# Patient Record
Sex: Female | Born: 1945 | Race: White | Hispanic: No | Marital: Married | Smoking: Never smoker
Health system: Southern US, Community
[De-identification: ages and names within clinical notes are randomized; demographics above are authoritative.]

## PROBLEM LIST (undated history)

## (undated) DIAGNOSIS — R911 Solitary pulmonary nodule: Secondary | ICD-10-CM

## (undated) DIAGNOSIS — K219 Gastro-esophageal reflux disease without esophagitis: Secondary | ICD-10-CM

## (undated) DIAGNOSIS — E049 Nontoxic goiter, unspecified: Secondary | ICD-10-CM

## (undated) DIAGNOSIS — M797 Fibromyalgia: Secondary | ICD-10-CM

## (undated) DIAGNOSIS — Z8371 Family history of colonic polyps: Secondary | ICD-10-CM

## (undated) DIAGNOSIS — R634 Abnormal weight loss: Secondary | ICD-10-CM

## (undated) DIAGNOSIS — R319 Hematuria, unspecified: Secondary | ICD-10-CM

## (undated) DIAGNOSIS — Z9109 Other allergy status, other than to drugs and biological substances: Secondary | ICD-10-CM

## (undated) DIAGNOSIS — F419 Anxiety disorder, unspecified: Secondary | ICD-10-CM

## (undated) DIAGNOSIS — I10 Essential (primary) hypertension: Secondary | ICD-10-CM

## (undated) DIAGNOSIS — Z83719 Family history of colon polyps, unspecified: Secondary | ICD-10-CM

## (undated) DIAGNOSIS — M542 Cervicalgia: Secondary | ICD-10-CM

## (undated) DIAGNOSIS — E785 Hyperlipidemia, unspecified: Secondary | ICD-10-CM

## (undated) DIAGNOSIS — N183 Chronic kidney disease, stage 3 unspecified: Secondary | ICD-10-CM

## (undated) DIAGNOSIS — E119 Type 2 diabetes mellitus without complications: Secondary | ICD-10-CM

## (undated) DIAGNOSIS — I2510 Atherosclerotic heart disease of native coronary artery without angina pectoris: Secondary | ICD-10-CM

## (undated) DIAGNOSIS — M810 Age-related osteoporosis without current pathological fracture: Secondary | ICD-10-CM

## (undated) HISTORY — DX: Other allergy status, other than to drugs and biological substances: Z91.09

## (undated) HISTORY — DX: Solitary pulmonary nodule: R91.1

## (undated) HISTORY — DX: Atherosclerotic heart disease of native coronary artery without angina pectoris: I25.10

## (undated) HISTORY — DX: Gastro-esophageal reflux disease without esophagitis: K21.9

## (undated) HISTORY — DX: Type 2 diabetes mellitus without complications: E11.9

## (undated) HISTORY — PX: NASAL SINUS SURGERY: SHX719

## (undated) HISTORY — DX: Hyperlipidemia, unspecified: E78.5

## (undated) HISTORY — DX: Anxiety disorder, unspecified: F41.9

## (undated) HISTORY — DX: Abnormal weight loss: R63.4

## (undated) HISTORY — PX: BACK SURGERY: SHX140

## (undated) HISTORY — DX: Cervicalgia: M54.2

## (undated) HISTORY — DX: Fibromyalgia: M79.7

## (undated) HISTORY — DX: Family history of colonic polyps: Z83.71

## (undated) HISTORY — DX: Chronic kidney disease, stage 3 (moderate): N18.3

## (undated) HISTORY — DX: Chronic kidney disease, stage 3 unspecified: N18.30

## (undated) HISTORY — DX: Nontoxic goiter, unspecified: E04.9

## (undated) HISTORY — DX: Essential (primary) hypertension: I10

## (undated) HISTORY — DX: Age-related osteoporosis without current pathological fracture: M81.0

## (undated) HISTORY — DX: Hematuria, unspecified: R31.9

## (undated) HISTORY — DX: Family history of colon polyps, unspecified: Z83.719

## (undated) HISTORY — PX: NECK SURGERY: SHX720

---

## 1998-03-04 ENCOUNTER — Other Ambulatory Visit: Admission: RE | Admit: 1998-03-04 | Discharge: 1998-03-04 | Payer: Self-pay | Admitting: Podiatry

## 1999-03-24 ENCOUNTER — Encounter: Admission: RE | Admit: 1999-03-24 | Discharge: 1999-03-24 | Payer: Self-pay | Admitting: Family Medicine

## 1999-03-24 ENCOUNTER — Encounter: Payer: Self-pay | Admitting: Family Medicine

## 1999-11-12 ENCOUNTER — Encounter: Admission: RE | Admit: 1999-11-12 | Discharge: 1999-11-12 | Payer: Self-pay | Admitting: Family Medicine

## 1999-11-12 ENCOUNTER — Encounter: Payer: Self-pay | Admitting: Family Medicine

## 2000-05-17 ENCOUNTER — Encounter: Admission: RE | Admit: 2000-05-17 | Discharge: 2000-05-17 | Payer: Self-pay | Admitting: Family Medicine

## 2000-05-17 ENCOUNTER — Encounter: Payer: Self-pay | Admitting: Family Medicine

## 2000-11-14 ENCOUNTER — Encounter: Payer: Self-pay | Admitting: Family Medicine

## 2000-11-14 ENCOUNTER — Encounter: Admission: RE | Admit: 2000-11-14 | Discharge: 2000-11-14 | Payer: Self-pay | Admitting: Family Medicine

## 2001-04-18 ENCOUNTER — Emergency Department (HOSPITAL_COMMUNITY): Admission: EM | Admit: 2001-04-18 | Discharge: 2001-04-18 | Payer: Self-pay | Admitting: Emergency Medicine

## 2001-11-20 ENCOUNTER — Encounter: Admission: RE | Admit: 2001-11-20 | Discharge: 2001-11-20 | Payer: Self-pay | Admitting: Family Medicine

## 2001-11-20 ENCOUNTER — Encounter: Payer: Self-pay | Admitting: Family Medicine

## 2002-09-13 ENCOUNTER — Encounter: Payer: Self-pay | Admitting: Specialist

## 2002-09-18 ENCOUNTER — Inpatient Hospital Stay (HOSPITAL_COMMUNITY): Admission: RE | Admit: 2002-09-18 | Discharge: 2002-09-20 | Payer: Self-pay | Admitting: Specialist

## 2002-09-18 ENCOUNTER — Encounter: Payer: Self-pay | Admitting: Specialist

## 2002-09-19 ENCOUNTER — Encounter: Payer: Self-pay | Admitting: Specialist

## 2002-11-25 ENCOUNTER — Encounter: Payer: Self-pay | Admitting: Family Medicine

## 2002-11-25 ENCOUNTER — Encounter: Admission: RE | Admit: 2002-11-25 | Discharge: 2002-11-25 | Payer: Self-pay | Admitting: Family Medicine

## 2003-11-25 ENCOUNTER — Other Ambulatory Visit: Admission: RE | Admit: 2003-11-25 | Discharge: 2003-11-25 | Payer: Self-pay | Admitting: Family Medicine

## 2003-11-28 ENCOUNTER — Ambulatory Visit (HOSPITAL_COMMUNITY): Admission: RE | Admit: 2003-11-28 | Discharge: 2003-11-28 | Payer: Self-pay | Admitting: Family Medicine

## 2003-12-22 ENCOUNTER — Ambulatory Visit (HOSPITAL_COMMUNITY): Admission: RE | Admit: 2003-12-22 | Discharge: 2003-12-22 | Payer: Self-pay | Admitting: Gastroenterology

## 2004-08-11 ENCOUNTER — Encounter: Admission: RE | Admit: 2004-08-11 | Discharge: 2004-08-11 | Payer: Self-pay | Admitting: Specialist

## 2004-12-08 ENCOUNTER — Ambulatory Visit (HOSPITAL_COMMUNITY): Admission: RE | Admit: 2004-12-08 | Discharge: 2004-12-08 | Payer: Self-pay | Admitting: Family Medicine

## 2005-01-05 ENCOUNTER — Other Ambulatory Visit: Admission: RE | Admit: 2005-01-05 | Discharge: 2005-01-05 | Payer: Self-pay | Admitting: Family Medicine

## 2005-08-12 ENCOUNTER — Encounter: Admission: RE | Admit: 2005-08-12 | Discharge: 2005-08-12 | Payer: Self-pay | Admitting: Family Medicine

## 2005-08-26 ENCOUNTER — Encounter: Admission: RE | Admit: 2005-08-26 | Discharge: 2005-08-26 | Payer: Self-pay | Admitting: Family Medicine

## 2005-12-12 ENCOUNTER — Ambulatory Visit (HOSPITAL_COMMUNITY): Admission: RE | Admit: 2005-12-12 | Discharge: 2005-12-12 | Payer: Self-pay | Admitting: Family Medicine

## 2006-01-19 ENCOUNTER — Other Ambulatory Visit: Admission: RE | Admit: 2006-01-19 | Discharge: 2006-01-19 | Payer: Self-pay | Admitting: Family Medicine

## 2006-07-25 ENCOUNTER — Encounter: Admission: RE | Admit: 2006-07-25 | Discharge: 2006-07-25 | Payer: Self-pay | Admitting: Family Medicine

## 2006-08-07 ENCOUNTER — Encounter: Admission: RE | Admit: 2006-08-07 | Discharge: 2006-08-07 | Payer: Self-pay | Admitting: Family Medicine

## 2006-12-26 ENCOUNTER — Ambulatory Visit (HOSPITAL_COMMUNITY): Admission: RE | Admit: 2006-12-26 | Discharge: 2006-12-26 | Payer: Self-pay | Admitting: Family Medicine

## 2007-09-03 ENCOUNTER — Other Ambulatory Visit: Admission: RE | Admit: 2007-09-03 | Discharge: 2007-09-03 | Payer: Self-pay | Admitting: Family Medicine

## 2008-01-21 ENCOUNTER — Ambulatory Visit (HOSPITAL_COMMUNITY): Admission: RE | Admit: 2008-01-21 | Discharge: 2008-01-21 | Payer: Self-pay | Admitting: Family Medicine

## 2008-09-12 ENCOUNTER — Emergency Department (HOSPITAL_COMMUNITY): Admission: EM | Admit: 2008-09-12 | Discharge: 2008-09-12 | Payer: Self-pay | Admitting: Emergency Medicine

## 2008-12-09 ENCOUNTER — Encounter: Admission: RE | Admit: 2008-12-09 | Discharge: 2008-12-09 | Payer: Self-pay | Admitting: Family Medicine

## 2008-12-09 ENCOUNTER — Other Ambulatory Visit: Admission: RE | Admit: 2008-12-09 | Discharge: 2008-12-09 | Payer: Self-pay | Admitting: Family Medicine

## 2009-01-21 ENCOUNTER — Ambulatory Visit (HOSPITAL_COMMUNITY): Admission: RE | Admit: 2009-01-21 | Discharge: 2009-01-21 | Payer: Self-pay | Admitting: Family Medicine

## 2010-01-22 ENCOUNTER — Ambulatory Visit (HOSPITAL_COMMUNITY)
Admission: RE | Admit: 2010-01-22 | Discharge: 2010-01-22 | Payer: Self-pay | Source: Home / Self Care | Admitting: Family Medicine

## 2010-03-14 ENCOUNTER — Encounter: Payer: Self-pay | Admitting: Family Medicine

## 2010-07-09 NOTE — Consult Note (Signed)
NAME:  Stephanie Henson, Stephanie Henson                         ACCOUNT NO.:  [id number]   MEDICAL RECORD NO.:  [id number]                   PATIENT TYPE:  INP   LOCATION:  2550                                 FACILITY:  [facility]   PHYSICIAN:  Gustavus Messing. Orlin Hilding, M.D.          DATE OF BIRTH:  Apr 30, 1945   DATE OF CONSULTATION:  09/18/2002  DATE OF DISCHARGE:                                   CONSULTATION   REPORT TITLE:  NEUROLOGY CONSULTATION   REFERRING PHYSICIAN:  Desma Maxim, M.D.   CHIEF COMPLAINT:  Left arm is numb and weak.   HISTORY OF PRESENT ILLNESS:  Ms. Drummonds is a 65 year old right-handed white  woman admitted for C5-6, C6-7 anterior fusion and diskectomy and left arm  pain and weakness from noted disk herniation and degenerative disk disease.  The patient tells me that she has had some weakness from the shoulder down  preoperatively but fairly mild. The degree of objective preoperative  weakness is not documented. She also had pain and shoulder numbness.  Office  notes indicate that she had normal strength in her shoulders and that her  neurologic examination was grossly intact in the bilateral upper extremities  as of June 65, 2004.   Postoperatively, the patient complained of total left arm weakness and  numbness and had flaccid left upper extremity.  Over the next few hours, it  improved considerably. I do not know if it is now at baseline.   REVIEW OF SYSTEMS:  She hurts all over and it hurts to move her limbs.  She  complains of left hip pain particularly where they took a piece of bone for  a graft and bilateral upper extremity pain.  She also complained of  bilateral upper extremity weakness.  No incontinence.   PAST MEDICAL HISTORY:  Significant for hypertension, hyperlipidemia,  migraine.  Remote surgery for deviated septum, remote right foot surgery,  and lumbar spine surgery in 1980.   PREOPERATIVE MEDICATIONS:  Welchol,  Zetia, hydrochlorothiazide, Zyrtec,  Vicodin and Atarax.  Postoperatively, she had received Dilaudid, Zofran, and  morphine.   ALLERGIES:  ASPIRIN and CODEINE.   SOCIAL HISTORY:  She is married.  Denies cigarette, alcohol, or drug use.   FAMILY HISTORY:  Positive for hypertension.   PHYSICAL EXAMINATION:  VITAL SIGNS:  Blood pressure 150/45, respirations 18,  95% saturation, pulse 100.  HEENT:  Head is normocephalic, atraumatic.  NECK:  Her neck was in a cervical collar.  Cannot assess her carotids.  HEART:  Regular rate and rhythm.  NEUROLOGICAL:  On mental status, she is somewhat sedated for pain and has  limited cooperation.  Cranial nerves:  The pupils are equal and reactive.  Disk margins are sharp.  Visual fields are full.  Extraocular movements are  intact without nystagmus, ophthalmus, paresis or ptosis.  Facial sensation  is normal.  Motor is intact, hearing is intact.  Palate is symmetric and  tongue is midline.  On motor examination, she has decreased effort diffusely  which makes it hard to really assess.  Her grips are weak bilaterally with  weak effort. However, she has at least 4/5 upper extremity strength  bilaterally and 5-/5 lower extremities. There is no drift on the left.  She  has pretty good biceps/triceps.  She does not make much effort with her hand  and winces when I try deltoid because of shoulder pain.  Reflexes are trace  brachioradialis, 2+ biceps, 2+ triceps, 2+ knee jerks, trace ankle jerks,  downgoing toes bilaterally.  Coordination finger-to-nose and heel-to-shin  are intact bilaterally.  Sensory examination is negative for any objective  abnormality.   CT scan of the neck postoperatively has the expected postoperative changes.  Head CT was not done.   IMPRESSION:  Transient left arm weakness, postoperative cervical diskectomy  and fusion. I cannot find any definite weakness any more.  Question whether  she had a transient pressure palsy, stretch injury, conscious injury, etc.  I do  not think that she had a stroke. It is hard to assess existing strength  in the face of immediately postoperative state sedation and decreased effort  with pain.   RECOMMENDATIONS:  If she is not at expected postoperatively baseline by  tomorrow morning, will check CT scan of the brain to rule out stroke  although I doubt that this is the case.                                               Catherine A. Orlin Hilding, M.D.    CAW/MEDQ  D:  09/18/2002  T:  09/18/2002  Job:  [id number]   cc:   Kerrin Champagne, M.D.  [address]  [place]  [place] [address]  Fax: [phone]   Donia Guiles, M.D.  [address] [place]  [place] [address]  Fax: [phone]

## 2010-07-09 NOTE — Op Note (Signed)
NAME:  Stephanie Henson, Stephanie Henson               ACCOUNT NO.:  [id number]   MEDICAL RECORD NO.:  [id number]          PATIENT TYPE:  AMB   LOCATION:  ENDO                         FACILITY:  [facility]   PHYSICIAN:  Graylin Shiver, M.D.   DATE OF BIRTH:  [birth year]   DATE OF PROCEDURE:  12/22/2003  DATE OF DISCHARGE:                                 OPERATIVE REPORT   PROCEDURE:  Colonoscopy.   INDICATIONS:  Heme-positive stool, family history of colon polyps.   Informed consent was obtained after explanation of the risks of bleeding,  infection, and perforation.   PREMEDICATION:  Fentanyl 75 mcg IV, Versed 10 mg IV.   PROCEDURE:  With the patient in the left lateral decubitus position, a  rectal exam was performed, no masses were felt.  The Olympus colonoscope was  inserted into the rectum and advanced around the colon to the cecum.  Cecal  landmarks were identified.  The cecum and ascending colon were normal, the  transverse colon normal, the descending, sigmoid, and rectum were normal.  She tolerated the procedure well without complications.   IMPRESSION:  Normal colonoscopy to the cecum.   I would recommend a follow-up colonoscopy again in five years.      SFG/MEDQ  D:  12/22/2003  T:  12/22/2003  Job:  [id number]   cc:   Donia Guiles, M.D.  [address] [place]  [place] [address]  Fax: [phone]

## 2010-07-09 NOTE — Op Note (Signed)
NAME:  Stephanie Henson, Stephanie Henson                         ACCOUNT NO.:  [id number]   MEDICAL RECORD NO.:  [id number]                   PATIENT TYPE:  INP   LOCATION:  2550                                 FACILITY:  [facility]   PHYSICIAN:  Kerrin Champagne, M.D.                DATE OF BIRTH:  [birth year]   DATE OF PROCEDURE:  09/18/2002  DATE OF DISCHARGE:                                 OPERATIVE REPORT   PREOPERATIVE DIAGNOSES:  1. Herniated nucleus pulposus central and left side, C6-7.  2. Degenerative disk disease, C5-6.   POSTOPERATIVE DIAGNOSES:  1. Herniated nucleus pulposus central and left side, C6-7.  2. Spondylosis changes, right C5-6, with degenerative disk disease.   PROCEDURES:  1. Anterior cervical diskectomy and fusion, C5-6 and C6-7, utilizing left     iliac crest bone graft harvested through a separate incision.  2. Internal fixation using a DePuy locking plate with 14 mm screws.   SURGEON:  Kerrin Champagne, M.D.   ASSISTANT:  Wende Neighbors, P.A.   ANESTHESIA:  GOT, Burna Forts, M.D.   ESTIMATED BLOOD LOSS:  50 mL.   DRAINS:  TLS drain left neck, Foley to straight drain.   BRIEF CLINICAL HISTORY:  The patient is a 65 year old female with a history  of neck pain and radiation to the left arm.  She has undergone attempts at  conservative management, including physical therapy, steroid injections,  without relief of pain.  Studies have indicated disk protrusion left C6-7,  degenerative disk changes at C4-5.  She has also been found on MRI study to  have changes consistent with impingement with bursitis and tendinitis in the  shoulders.  She underwent extensive conservative management and has  continued to require narcotic medicines, Norco one to two every four to six  hours p.r.n. pain.  After a failure of conservative management, the patient  is brought to the operating room to undergo anterior diskectomy and fusion  at C5-6 and C6-7.   INTRAOPERATIVE FINDINGS:   Disk protrusion, left C6-7, affecting the left  side of the cord, left C7 nerve root.  C5-6 with enlarged spondylitic spur  off the right side, the posterior uncovertebral joint over the posterior  inferior aspect of C5 and the posterior superior aspect of C6 affecting the  right C6 nerve root.   DESCRIPTION OF PROCEDURE:  After adequate general anesthesia, the patient  was placed on the operating room bed, the head in the [place] horseshoe,  five pounds of cervical Holter traction in a slight extension.  A bump under  the left buttock.  Standard prep with Duraprep solution over the left and  anterior neck and over the left iliac crest, draped in the usual manner,  iodine Vi-Drape was used.  All pressure points were well-padded.  The arms  placed with the side holders to prevent their subluxation.  Standard  preoperative antibiotics.  Draped  in the usual manner.  The incision left  neck approximately 3.5-4 cm in length through the skin and subcutaneous  layers in line with the patient's skin creases at about the C6 level.  This  was judged by the cricothyroid cartilage as well as the carotid tuberosity.  The incision carried sharply through skin and subcu layers down to the  platysmal layer, and this was incised in line with the skin incision.  This  was spread bluntly to the anterior aspect of the cervical spine in the  interval between the trachea and esophagus medially and carotid sheath  laterally.  The trachea and esophagus then were retracted to the right side  using a hand-held Cloward.  Two disk spaces felt to be the C5-6 and C6-7  levels were then identified and a spinal needle was inserted above C5-6 and  C6-7 as judged by where the carotid tuberosity was located.  Intraoperative  lateral radiograph demonstrated these to be the aforementioned position of  C5-6 and C6-7.  While the radiograph was being developed, the incision was  made on the left anterior lateral iliac crest  about three fingerbreadths  posterior to the anterior superior iliac spine on the left side.  The  incision approximately 2 to 2-1/2 inches in length through the skin and  subcu layers directly down to the superficial portion of the iliac crest,  interval between the abdominal fascia and the thigh fascia.  This was then  incised sharply using electrocautery and subperiosteal dissection carried  over the superior aspect of the iliac crest anterolaterally and  anteromedial, exposing this area.  This was then packed for later graft  harvest.  Returning to the incision after the identification of the levels,  the needles were carefully removed and a 15 blade scalpel used to excise a  portion of the anterior aspect of the disk annulus at C5-6 and C6-7 levels,  excising these.  Once this was completed, then the disk was removed from the  intervertebral disk space at C5-6 and C6-7.  Over two-thirds of the anterior  portion of the disks were excised.  Screw posts 14 mm in length were then  inserted into the vertebral body of C6 and C6-7 and distraction obtained.  The operating room microscope draped sterilely and brought into the field  under direct visualization, then the cartilaginous end plates were debrided  using microcurettes back to the posterior aspect of the disk space, where  the posterior annulus was resected.  Posterior longitudinal ligament  identified.  This showed some calcification.  It was also removed.  Disk  material was found to be present.  It appeared to be chronic centrally and  toward the left side.  A single fragment over the posterior aspect of the  inferior posterior lip of C7  was removed that apparently had been causing  some cord compression and left C7 compression.  Following decompression,  then a nerve hook could be passed easily out the neural foramen on the left  side at C6-7 and on the right side as well.  The end plates were carefully cleaned of any  cartilaginous end plate material down to bleeding bone.  A  high-speed bur used to further decorticate these areas.  The height of the  intervertebral disk space was noted to have 7 mm, depth measured at  approximately 15-16 mm.  Graft was then harvested from the left iliac crest  using a dual oscillating saw and carefully protecting the soft tissues  where  each osteotome used to incise the base of this graft and remove it, with the  depth measured at approximately 12 mm and the height 7 mm.  It was carefully  tapered to the dimensions of the intervertebral disk space and impacted into  place.  Traction was then moved off of this particular level.  Screw post  was removed from the C7 vertebral body, bone wax applied to the screw post  hole to obtain hemostasis.  Screw post was then inserted into the vertebral  body of C5 and the self-retaining McCullough retractors were reinserted with  the foot of the retractor beneath the medial border of the longus colli  muscle as it had been previously, and then this was used for exposure of the  anterior aspect of the C5-6 level.  Distraction obtained.  The operating  room microscope brought into the field and then under direct visualization,  the posterior aspect of the disk was debrided back to the posterior annular  fibers and these were resected using 1 and 2 mm Kerrisons.  Posterior lip  osteophytes were resected off the posterior superior aspect of C6, off the  posterior inferior aspect of C5.  Once this was completed, then very large  osteophytes within the posterior longitudinal ligament were resected.   On the right side the particular spurs were evident that were compressing  the right  side of the cervical cord and the right C6 nerve root, and these  were decompressed adequately without any evidence of abnormality following  the resection other than indentation of the right-sided thecal sac and right  C6 nerve root noted at that point.    Irrigation was then performed.  Carefully the end plates were decorticated  of any cartilaginous end plate material.  The height of the intervertebral  disk space measured at 7 mm, the depth measured at approximately 15-16 mm.  Bone graft then again harvested from the left iliac crest using a dual  oscillating saw set at 7 mm, dividing across the base with a quarter-inch  osteotome.  This was carefully tapered to the dimensions of the  intervertebral disk space and the depth expected at about 12-13 mm, height  of 7 mm.  The graft was then placed into the disk space, impacted into place  without difficulty.  The screw posts were removed at both C5 and C6.  Bone  wax applied to the bleeding screw post holes.  Carefully the osteophytes of  the anterior lip of the disk space at C5-6 and C6-7 were then debrided using  a high-speed bur at both levels.  Bone wax applied to the bleeding  cancellous bone levels.  The expected length of the plate was then determined using bone wax applied  to a cottonoid string and measured approximately 33 mm between screw holes  for a total length of about 40 mm.  The plate was placed across the anterior  aspect of the cervical spine, a lined retaining pin placed into the  vertebral body of C5 centrally.  An inferior retaining pin could not be  placed because of previous screw post hole preventing it insertion.  The  first screw holes that were placed were at the C6 level, first the right  side, then the left side, drilling 14 mm and placing 14 mm screws.  The five-  pound cervical traction was removed at this point.  Then the lower screws  were then placed at the C7 level using a 14  mm drill, then placing the  appropriate 14 mm screws.  This was then done at the C5 level bilaterally,  right and left side, without difficulty.   Once this was completed, then adequate fixation was judged to be present and  locking screws were then turned a total of 180-200 degrees  to lock the plate  to the screws.  Intraoperative lateral radiograph demonstrated the plates to  be oblique on the radiograph so that a C-arm fluoroscopy was necessary in  order to ascertain the correct position and alignment of the screws.  Following C-arm fluoroscopy, the screws showed no evidence of bridging of  the posterior cortex of the vertebral bodies at C5, C6, or C7, indicating  screw length was correct, and there was no evidence of retropulsion of bone  graft material at either C5-6 or C6-7.  Irrigation was performed.  Careful  inspection of the esophagus demonstrated no abnormalities.  Soft tissue  allowed to fall back into place and a TLS drain placed in the depth of the  incision along the left side, exiting below the incision scar.  The  platysmal layer then approximated with interrupted 3-0 Vicryl suture, the  deep subcu layers with interrupted 3-0 Vicryl sutures, and the skin closed  with a running subcu stitch of 4-0 Vicryl.  Tincture of Benzoin and Steri-  Strips applied.  The drain was sewn in place with a 4-0 nylon stitch.  The  left iliac crest bone graft harvest site was carefully irrigated and bone  wax applied to the bleeding cancellous bone and Gelfoam applied.  The  fascial layer was approximated over the iliac crest with interrupted #1  Vicryl sutures, deep subcu layers approximated with interrupted 0 and 2-0  Vicryl sutures, skin closed with a running subcu stitch of 4-0 Vicryl.  Tincture of Benzoin and Steri-Strips applied, 4 x 4's affixed to the skin  with Hypafix tape on the left iliac crest and left neck.  A [place]  collar applied.  The patient then reactivated and extubated and returned to  the recovery room in satisfactory condition.  All instrument and sponge  counts were correct.                                               Kerrin Champagne, M.D.    Myra Rude  D:  09/18/2002  T:  09/18/2002  Job:  [id number]

## 2010-12-17 ENCOUNTER — Other Ambulatory Visit (HOSPITAL_COMMUNITY): Payer: Self-pay | Admitting: Family Medicine

## 2010-12-17 DIAGNOSIS — Z1231 Encounter for screening mammogram for malignant neoplasm of breast: Secondary | ICD-10-CM

## 2011-01-27 ENCOUNTER — Ambulatory Visit (HOSPITAL_COMMUNITY)
Admission: RE | Admit: 2011-01-27 | Discharge: 2011-01-27 | Disposition: A | Discharge status: Home / Self Care | Payer: Medicare Other | Source: Ambulatory Visit | Attending: Family Medicine | Admitting: Family Medicine

## 2011-01-27 DIAGNOSIS — Z1231 Encounter for screening mammogram for malignant neoplasm of breast: Secondary | ICD-10-CM | POA: Insufficient documentation

## 2011-06-29 ENCOUNTER — Other Ambulatory Visit: Payer: Self-pay | Admitting: Family Medicine

## 2011-06-29 DIAGNOSIS — N644 Mastodynia: Secondary | ICD-10-CM

## 2011-07-01 ENCOUNTER — Ambulatory Visit
Admission: RE | Admit: 2011-07-01 | Discharge: 2011-07-01 | Disposition: A | Discharge status: Home / Self Care | Payer: Medicare Other | Source: Ambulatory Visit | Attending: Family Medicine | Admitting: Family Medicine

## 2011-07-01 DIAGNOSIS — N644 Mastodynia: Secondary | ICD-10-CM

## 2011-07-28 ENCOUNTER — Other Ambulatory Visit: Payer: Self-pay | Admitting: Family Medicine

## 2011-07-28 ENCOUNTER — Other Ambulatory Visit (HOSPITAL_COMMUNITY)
Admission: RE | Admit: 2011-07-28 | Discharge: 2011-07-28 | Disposition: A | Discharge status: Home / Self Care | Payer: Medicare Other | Source: Ambulatory Visit | Attending: Family Medicine | Admitting: Family Medicine

## 2011-07-28 DIAGNOSIS — Z124 Encounter for screening for malignant neoplasm of cervix: Secondary | ICD-10-CM | POA: Insufficient documentation

## 2011-07-28 DIAGNOSIS — R102 Pelvic and perineal pain: Secondary | ICD-10-CM

## 2011-07-29 ENCOUNTER — Other Ambulatory Visit: Payer: Medicare Other

## 2012-01-05 ENCOUNTER — Other Ambulatory Visit: Payer: Self-pay | Admitting: Family Medicine

## 2012-01-05 DIAGNOSIS — Z1231 Encounter for screening mammogram for malignant neoplasm of breast: Secondary | ICD-10-CM

## 2012-02-16 ENCOUNTER — Ambulatory Visit: Payer: Medicare Other

## 2012-05-01 ENCOUNTER — Ambulatory Visit
Admission: RE | Admit: 2012-05-01 | Discharge: 2012-05-01 | Disposition: A | Discharge status: Home / Self Care | Payer: Medicare Other | Source: Ambulatory Visit | Attending: Family Medicine | Admitting: Family Medicine

## 2012-05-01 ENCOUNTER — Other Ambulatory Visit: Payer: Self-pay | Admitting: Family Medicine

## 2012-05-01 DIAGNOSIS — R109 Unspecified abdominal pain: Secondary | ICD-10-CM

## 2012-06-15 ENCOUNTER — Other Ambulatory Visit (HOSPITAL_COMMUNITY): Payer: Self-pay | Admitting: Gastroenterology

## 2012-06-15 ENCOUNTER — Other Ambulatory Visit: Payer: Self-pay | Admitting: Gastroenterology

## 2012-06-15 DIAGNOSIS — R1013 Epigastric pain: Secondary | ICD-10-CM

## 2012-06-26 ENCOUNTER — Encounter (HOSPITAL_COMMUNITY)
Admission: RE | Admit: 2012-06-26 | Discharge: 2012-06-26 | Disposition: A | Discharge status: Home / Self Care | Payer: Medicare Other | Source: Ambulatory Visit | Attending: Gastroenterology | Admitting: Gastroenterology

## 2012-06-26 DIAGNOSIS — R1013 Epigastric pain: Secondary | ICD-10-CM | POA: Insufficient documentation

## 2012-06-26 MED ORDER — SINCALIDE 5 MCG IJ SOLR
0.0200 ug/kg | Freq: Once | INTRAMUSCULAR | Status: AC
  Administered 2012-06-26: 0.97 ug via INTRAVENOUS

## 2012-06-26 MED ORDER — TECHNETIUM TC 99M MEBROFENIN IV KIT
5.0000 | PACK | Freq: Once | INTRAVENOUS | Status: AC | PRN
  Administered 2012-06-26: 5 via INTRAVENOUS

## 2012-06-26 MED ORDER — SINCALIDE 5 MCG IJ SOLR
INTRAMUSCULAR | Status: AC
  Administered 2012-06-26: 0.97 ug via INTRAVENOUS
  Filled 2012-06-26: qty 5

## 2012-09-17 ENCOUNTER — Other Ambulatory Visit: Payer: Self-pay | Admitting: Family Medicine

## 2012-09-17 DIAGNOSIS — N63 Unspecified lump in unspecified breast: Secondary | ICD-10-CM

## 2012-10-02 ENCOUNTER — Ambulatory Visit
Admission: RE | Admit: 2012-10-02 | Discharge: 2012-10-02 | Disposition: A | Discharge status: Home / Self Care | Payer: Medicare Other | Source: Ambulatory Visit | Attending: Family Medicine | Admitting: Family Medicine

## 2012-10-02 DIAGNOSIS — N63 Unspecified lump in unspecified breast: Secondary | ICD-10-CM

## 2012-10-17 ENCOUNTER — Other Ambulatory Visit: Payer: Self-pay | Admitting: Family Medicine

## 2012-10-17 DIAGNOSIS — R109 Unspecified abdominal pain: Secondary | ICD-10-CM

## 2012-10-19 ENCOUNTER — Ambulatory Visit
Admission: RE | Admit: 2012-10-19 | Discharge: 2012-10-19 | Disposition: A | Discharge status: Home / Self Care | Payer: Medicare Other | Source: Ambulatory Visit | Attending: Family Medicine | Admitting: Family Medicine

## 2012-10-19 DIAGNOSIS — R109 Unspecified abdominal pain: Secondary | ICD-10-CM

## 2012-10-19 MED ORDER — IOHEXOL 300 MG/ML  SOLN
100.0000 mL | Freq: Once | INTRAMUSCULAR | Status: AC | PRN
  Administered 2012-10-19: 100 mL via INTRAVENOUS

## 2012-12-12 ENCOUNTER — Other Ambulatory Visit: Payer: Self-pay | Admitting: Family Medicine

## 2012-12-12 DIAGNOSIS — R911 Solitary pulmonary nodule: Secondary | ICD-10-CM

## 2012-12-13 ENCOUNTER — Ambulatory Visit
Admission: RE | Admit: 2012-12-13 | Discharge: 2012-12-13 | Disposition: A | Discharge status: Home / Self Care | Payer: Medicaid Other | Source: Ambulatory Visit | Attending: Family Medicine | Admitting: Family Medicine

## 2012-12-13 DIAGNOSIS — R911 Solitary pulmonary nodule: Secondary | ICD-10-CM

## 2012-12-13 MED ORDER — IOHEXOL 300 MG/ML  SOLN
75.0000 mL | Freq: Once | INTRAMUSCULAR | Status: AC | PRN
  Administered 2012-12-13: 75 mL via INTRAVENOUS

## 2012-12-24 ENCOUNTER — Encounter: Payer: Self-pay | Admitting: Internal Medicine

## 2012-12-24 ENCOUNTER — Encounter: Payer: Self-pay | Admitting: *Deleted

## 2012-12-25 ENCOUNTER — Encounter: Payer: Self-pay | Admitting: Internal Medicine

## 2012-12-25 ENCOUNTER — Ambulatory Visit (INDEPENDENT_AMBULATORY_CARE_PROVIDER_SITE_OTHER): Payer: Medicare Other | Admitting: Internal Medicine

## 2012-12-25 VITALS — BP 120/62 | HR 68 | Temp 97.9°F | Ht 64.0 in | Wt 111.0 lb

## 2012-12-25 DIAGNOSIS — R918 Other nonspecific abnormal finding of lung field: Secondary | ICD-10-CM

## 2012-12-25 DIAGNOSIS — J310 Chronic rhinitis: Secondary | ICD-10-CM

## 2012-12-25 NOTE — Progress Notes (Signed)
Subjective:    Patient ID: Stephanie Henson, female    DOB: 04/17/1945   MRN: [id number]  HPI  40 yowf never smoker bothered by arthritis/back problems but abd pain onset march 2014 acute onset dizzy and fatigue summer 2014 assoc with headache with w/u with abd ct which suggested "a lung problem" referred 12/25/12 to pulmonary clinic by Dr Philipp Deputy.   12/25/2012 1st [facility] office visit/ Wert cc persistent abd pain x 6 months no change with eating rx with several antibiotics "because of Stephanie lung infection" but gives no hx of cough, fever, sweats, pleuritic cp.  Does have poor activity tol but this more due to fatigue than sob.  Tried Zmax and no clinical change noted.  Only resp complaint is drippy nose year round, watery, no resp to clariton.  No obvious day to day or daytime variabilty or assoc chronic cough or cp or chest tightness, subjective wheeze overt sinus or hb symptoms. No unusual exp hx or h/o childhood pna/ asthma or knowledge of premature birth.  Sleeping ok without nocturnal  or early am exacerbation  of respiratory  c/o's or need for noct saba. Also denies any obvious fluctuation of symptoms with weather or environmental changes or other aggravating or alleviating factors except as outlined above   Current Medications, Allergies, Complete Past Medical History, Past Surgical History, Family History, and Social History were reviewed in [employer] record.      Review of Systems  Constitutional: Positive for unexpected weight change. Negative for fever and chills.  HENT: Negative for congestion, dental problem, ear pain, nosebleeds, postnasal drip, rhinorrhea, sinus pressure, sneezing, sore throat, trouble swallowing and voice change.   Eyes: Negative for visual disturbance.  Respiratory: Positive for shortness of breath. Negative for cough and choking.   Cardiovascular: Negative for chest pain and leg swelling.  Gastrointestinal: Positive for  abdominal pain. Negative for vomiting and diarrhea.  Genitourinary: Negative for difficulty urinating.       Heartburn Indigestion  Musculoskeletal: Positive for arthralgias.  Skin: Negative for rash.  Neurological: Negative for tremors, syncope and headaches.  Hematological: Does not bruise/bleed easily.       Objective:   Physical Exam  Depressed chronically ill elderly wf nad  Wt Readings from Last 3 Encounters:  12/25/12 111 lb (50.349 kg)      HEENT: nl dentition, turbinates, and orophanx. Nl external ear canals without cough reflex   NECK :  without JVD/Nodes/TM/ nl carotid upstrokes bilaterally   LUNGS: no acc muscle use, clear to A and P bilaterally without cough on insp or exp maneuvers   CV:  RRR  no s3 or murmur or increase in P2, no edema   ABD:  soft and nontender with nl excursion in the supine position. No bruits or organomegaly, bowel sounds nl  MS:  warm without deformities, calf tenderness, cyanosis or clubbing  SKIN: warm and dry without lesions    NEURO:  alert, approp, no deficits    CT chest 12/13/12  Underlying centrilobular emphysema with mild bronchiectatic change.  Scattered nodular opacities as described, largest measuring 7 x 7  mm. The further followup of this nodular opacity should be based on  Fleischner Society guidelines.  The mild lower lobe nodular interstitial disease. There is a  tree-in-bud pattern in the right middle lobe. This is a finding that  may be secondary to atypical infection or indicative of underlying  localized obliterative bronchiolitis. Note that there is no evidence  of honeycombing on this study to suggest frank interstitial  fibrosis.  There is mild ground-glass infiltrate in the right middle lobe  laterally. This finding may be indicative of infection is well or  possibly the localized obliterative bronchiolitis. The  No adenopathy.        Assessment & Plan:

## 2012-12-25 NOTE — Patient Instructions (Signed)
Zyrtec is the best choice for drippy nose with chlortrimeton also an option (but may make you sleepy)  I recommend a follow up a 6 months

## 2012-12-26 DIAGNOSIS — R918 Other nonspecific abnormal finding of lung field: Secondary | ICD-10-CM | POA: Insufficient documentation

## 2012-12-26 DIAGNOSIS — J310 Chronic rhinitis: Secondary | ICD-10-CM | POA: Insufficient documentation

## 2012-12-26 NOTE — Assessment & Plan Note (Addendum)
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

## 2012-12-26 NOTE — Assessment & Plan Note (Signed)
Although there are clearly abnormalities on CT scan, they should probably be considered "microscopic" since not obvious on plain cxr .     In the setting of obvious "macroscopic" health issues,  I am very reluctant to embark on an invasive w/u at this point - she very well could have very mild MAI but not at all convinced it has anything to do with her symptoms in absence of more convincing resp symptoms, which are lacking.  I would f/u this type of pt with serial plain cxr's (repeat one in 6 months then yearly thereafter)  in the absence of any resp symptoms and unless there is convincing evolving macroscopic changes seen on plain cxr or def pulmonary symptoms would not pursue fob or rx for MAI

## 2013-04-18 ENCOUNTER — Ambulatory Visit: Payer: Self-pay | Admitting: Podiatry

## 2013-05-01 ENCOUNTER — Ambulatory Visit: Payer: Self-pay | Admitting: Podiatry

## 2013-05-02 ENCOUNTER — Ambulatory Visit: Payer: Self-pay | Admitting: Podiatry

## 2013-05-02 ENCOUNTER — Ambulatory Visit (INDEPENDENT_AMBULATORY_CARE_PROVIDER_SITE_OTHER): Payer: Medicare Other

## 2013-05-02 ENCOUNTER — Ambulatory Visit: Payer: Medicare Other | Admitting: Podiatry

## 2013-05-02 ENCOUNTER — Encounter: Payer: Self-pay | Admitting: Podiatry

## 2013-05-02 VITALS — Resp 14 | Ht 64.0 in | Wt 111.0 lb

## 2013-05-02 DIAGNOSIS — Q828 Other specified congenital malformations of skin: Secondary | ICD-10-CM

## 2013-05-02 DIAGNOSIS — M79609 Pain in unspecified limb: Secondary | ICD-10-CM

## 2013-05-02 DIAGNOSIS — B351 Tinea unguium: Secondary | ICD-10-CM

## 2013-05-02 DIAGNOSIS — M79606 Pain in leg, unspecified: Secondary | ICD-10-CM

## 2013-05-02 DIAGNOSIS — M722 Plantar fascial fibromatosis: Secondary | ICD-10-CM

## 2013-05-02 MED ORDER — TRIAMCINOLONE ACETONIDE 10 MG/ML IJ SUSP
10.0000 mg | Freq: Once | INTRAMUSCULAR | Status: AC
  Administered 2013-05-02: 10 mg

## 2013-05-02 NOTE — Progress Notes (Signed)
   Subjective:    Patient ID: Stephanie Henson, female    DOB: Oct 07, 1945, 68 y.o.   MRN: [id number]  HPI N heel pain                                                                    Pt c/o thick toenails on right 2,3,4,5 toes and hard spots on her feet.        L B/L plantar heels        D and O 1 year        C burning        A worse after rest, and in morning        T heel pads  Review of Systems  Neurological: Positive for weakness.  Psychiatric/Behavioral: The patient is nervous/anxious.   All other systems reviewed and are negative.       Objective:   Physical Exam        Assessment & Plan:

## 2013-05-04 NOTE — Progress Notes (Signed)
Subjective:     Patient ID: Stephanie Henson, female   DOB: 18-Oct-1945, 68 y.o.   MRN: [id number]  HPI patient presents stating that I'm having pain in both of my heel spur approximately 1 year and that it seems worse after rest or in the morning. Also complains of thick nails on the right foot and lesions on the bottom of the right foot of long-term nature   Review of Systems  All other systems reviewed and are negative.       Objective:   Physical Exam  Nursing note and vitals reviewed. Constitutional: She is oriented to person, place, and time.  Cardiovascular: Intact distal pulses.   Musculoskeletal: Normal range of motion.  Neurological: She is oriented to person, place, and time.  Skin: Skin is warm.   neurovascular status intact with muscle strength adequate and no equinus noted and adequate range of motion. Patient is found to have pain in the plantar heel of both feet with inflammation and fluid at the insertion of the tendon into the calcaneus. Nails are thickened 2 through 5 right foot and there is numerous keratotic lesions plantar right with small waxy cord lesions noted. Patient's arch height is within normal limits and Fill to digits was found to be excellent    Assessment:     Plantar fasciitis of the heel region of both feet with pain and nail disease 2 through 5 right foot along with porokeratotic lesion formation    Plan:     H&P and x-rays were review with patient. Injected the plantar heels of both feet 3 mg Kenalog 5 mg Xylocaine Marcaine mixture and advised on physical therapy and supportive shoe gear usage. Debrided lesions and nailbeds with no bleeding noted and reappoint for recheck

## 2013-05-08 ENCOUNTER — Ambulatory Visit: Payer: Self-pay | Admitting: Podiatry

## 2013-06-13 ENCOUNTER — Ambulatory Visit: Payer: Medicare Other | Admitting: Podiatry

## 2013-09-19 ENCOUNTER — Other Ambulatory Visit: Payer: Self-pay | Admitting: Family Medicine

## 2013-09-19 DIAGNOSIS — R519 Headache, unspecified: Secondary | ICD-10-CM

## 2013-09-19 DIAGNOSIS — R51 Headache: Principal | ICD-10-CM

## 2013-09-24 ENCOUNTER — Other Ambulatory Visit: Payer: Medicare Other

## 2013-09-25 ENCOUNTER — Ambulatory Visit
Admission: RE | Admit: 2013-09-25 | Discharge: 2013-09-25 | Disposition: A | Discharge status: Home / Self Care | Payer: PRIVATE HEALTH INSURANCE | Source: Ambulatory Visit | Attending: Family Medicine | Admitting: Family Medicine

## 2013-09-25 ENCOUNTER — Other Ambulatory Visit: Payer: Medicare Other

## 2013-09-25 DIAGNOSIS — R519 Headache, unspecified: Secondary | ICD-10-CM

## 2013-09-25 DIAGNOSIS — R51 Headache: Principal | ICD-10-CM

## 2013-09-25 MED ORDER — IOHEXOL 300 MG/ML  SOLN
75.0000 mL | Freq: Once | INTRAMUSCULAR | Status: AC | PRN
  Administered 2013-09-25: 75 mL via INTRAVENOUS

## 2013-10-18 ENCOUNTER — Ambulatory Visit
Admission: RE | Admit: 2013-10-18 | Discharge: 2013-10-18 | Disposition: A | Discharge status: Home / Self Care | Payer: PRIVATE HEALTH INSURANCE | Source: Ambulatory Visit | Attending: Family Medicine | Admitting: Family Medicine

## 2013-10-18 ENCOUNTER — Other Ambulatory Visit: Payer: Self-pay | Admitting: Family Medicine

## 2013-10-18 DIAGNOSIS — R918 Other nonspecific abnormal finding of lung field: Secondary | ICD-10-CM

## 2013-10-24 ENCOUNTER — Other Ambulatory Visit: Payer: Self-pay | Admitting: Family Medicine

## 2013-10-24 DIAGNOSIS — R911 Solitary pulmonary nodule: Secondary | ICD-10-CM

## 2013-10-30 ENCOUNTER — Other Ambulatory Visit: Payer: PRIVATE HEALTH INSURANCE

## 2013-11-26 ENCOUNTER — Other Ambulatory Visit: Payer: PRIVATE HEALTH INSURANCE

## 2014-03-03 ENCOUNTER — Ambulatory Visit (INDEPENDENT_AMBULATORY_CARE_PROVIDER_SITE_OTHER): Payer: Medicare Other | Admitting: Podiatry

## 2014-03-03 ENCOUNTER — Encounter: Payer: Self-pay | Admitting: Podiatry

## 2014-03-03 VITALS — BP 142/72 | HR 61 | Resp 16

## 2014-03-03 DIAGNOSIS — L84 Corns and callosities: Secondary | ICD-10-CM

## 2014-03-03 DIAGNOSIS — M722 Plantar fascial fibromatosis: Secondary | ICD-10-CM

## 2014-03-03 MED ORDER — TRIAMCINOLONE ACETONIDE 10 MG/ML IJ SUSP
10.0000 mg | Freq: Once | INTRAMUSCULAR | Status: AC
  Administered 2014-03-03: 10 mg

## 2014-03-03 NOTE — Progress Notes (Signed)
Subjective:     Patient ID: Stephanie Henson, female   DOB: 10/27/1945, 69 y.o.   MRN: [id number]  HPI patient states my heels have started to hurt on both feet again over the last month and I have the small lesions on the bottom of my right foot that are really sore   Review of Systems     Objective:   Physical Exam Neurovascular status unchanged with pain in the plantar fascia of both feet and also to keratotic lesions plantar right forefoot that are painful when pressed    Assessment:     Plantar fasciitis of both heels along with poor oh keratotic lesion 2 right    Plan:     Reviewed condition and injected the plantar heel of both feet 3 mg dexamethasone Kenalog 5 mg Xylocaine and debrided the lesions on the right foot and reappoint as needed

## 2014-03-10 ENCOUNTER — Telehealth: Payer: Self-pay | Admitting: *Deleted

## 2014-03-10 NOTE — Telephone Encounter (Addendum)
Pt states she was seen by Dr. Charlsie Merlesegal last week, and given 2 injections and she wanted to let him know the injections did not work.  Please call.  I spoke with the pt and encouraged her to ice her feet 3 - 4 times daily for 10 - 15 minutes, taking precautions to cover the ice packs with a towel, and I offered an appt with Dr. Charlsie Merlesegal next week.  Pt is referred to schedulers.  I asked pt if she was able to take Ibuprofen, but pt states it upset her stomach, I told her not to take it then.

## 2014-04-24 ENCOUNTER — Other Ambulatory Visit: Payer: Self-pay | Admitting: Family Medicine

## 2014-04-24 DIAGNOSIS — R911 Solitary pulmonary nodule: Secondary | ICD-10-CM

## 2014-04-30 ENCOUNTER — Ambulatory Visit
Admission: RE | Admit: 2014-04-30 | Discharge: 2014-04-30 | Disposition: A | Discharge status: Home / Self Care | Payer: Medicare Other | Source: Ambulatory Visit | Attending: Family Medicine | Admitting: Family Medicine

## 2014-04-30 DIAGNOSIS — R911 Solitary pulmonary nodule: Secondary | ICD-10-CM

## 2014-11-10 ENCOUNTER — Other Ambulatory Visit: Payer: Self-pay | Admitting: Family Medicine

## 2014-11-10 DIAGNOSIS — R911 Solitary pulmonary nodule: Secondary | ICD-10-CM

## 2014-11-14 ENCOUNTER — Ambulatory Visit
Admission: RE | Admit: 2014-11-14 | Discharge: 2014-11-14 | Disposition: A | Discharge status: Home / Self Care | Payer: Medicare Other | Source: Ambulatory Visit | Attending: Family Medicine | Admitting: Family Medicine

## 2014-11-14 DIAGNOSIS — R911 Solitary pulmonary nodule: Secondary | ICD-10-CM

## 2015-02-26 ENCOUNTER — Emergency Department (HOSPITAL_COMMUNITY): Payer: Medicare Other

## 2015-02-26 ENCOUNTER — Encounter (HOSPITAL_COMMUNITY): Payer: Self-pay

## 2015-02-26 ENCOUNTER — Emergency Department (HOSPITAL_COMMUNITY)
Admission: EM | Admit: 2015-02-26 | Discharge: 2015-02-27 | Disposition: A | Discharge status: Home / Self Care | Payer: Medicare Other | Attending: Emergency Medicine | Admitting: Emergency Medicine

## 2015-02-26 DIAGNOSIS — R519 Headache, unspecified: Secondary | ICD-10-CM

## 2015-02-26 DIAGNOSIS — R531 Weakness: Secondary | ICD-10-CM | POA: Diagnosis not present

## 2015-02-26 DIAGNOSIS — F419 Anxiety disorder, unspecified: Secondary | ICD-10-CM | POA: Diagnosis not present

## 2015-02-26 DIAGNOSIS — I10 Essential (primary) hypertension: Secondary | ICD-10-CM | POA: Insufficient documentation

## 2015-02-26 DIAGNOSIS — Z79899 Other long term (current) drug therapy: Secondary | ICD-10-CM | POA: Insufficient documentation

## 2015-02-26 DIAGNOSIS — R42 Dizziness and giddiness: Secondary | ICD-10-CM | POA: Diagnosis not present

## 2015-02-26 DIAGNOSIS — Z8719 Personal history of other diseases of the digestive system: Secondary | ICD-10-CM | POA: Insufficient documentation

## 2015-02-26 DIAGNOSIS — M810 Age-related osteoporosis without current pathological fracture: Secondary | ICD-10-CM | POA: Insufficient documentation

## 2015-02-26 DIAGNOSIS — R51 Headache: Secondary | ICD-10-CM | POA: Insufficient documentation

## 2015-02-26 DIAGNOSIS — Z7982 Long term (current) use of aspirin: Secondary | ICD-10-CM | POA: Insufficient documentation

## 2015-02-26 DIAGNOSIS — E785 Hyperlipidemia, unspecified: Secondary | ICD-10-CM | POA: Diagnosis not present

## 2015-02-26 LAB — COMPREHENSIVE METABOLIC PANEL
ALK PHOS: 78 U/L (ref 38–126)
ALT: 19 U/L (ref 14–54)
ANION GAP: 12 (ref 5–15)
AST: 23 U/L (ref 15–41)
Albumin: 4.5 g/dL (ref 3.5–5.0)
BUN: 20 mg/dL (ref 6–20)
CALCIUM: 9.7 mg/dL (ref 8.9–10.3)
CO2: 27 mmol/L (ref 22–32)
Chloride: 101 mmol/L (ref 101–111)
Creatinine, Ser: 0.9 mg/dL (ref 0.44–1.00)
GFR calc non Af Amer: >60 mL/min (ref >60)
Glucose, Bld: 123 mg/dL — ABNORMAL HIGH (ref 65–99)
Potassium: 3.5 mmol/L (ref 3.5–5.1)
SODIUM: 140 mmol/L (ref 135–145)
Total Bilirubin: 0.6 mg/dL (ref 0.3–1.2)
Total Protein: 7.6 g/dL (ref 6.5–8.1)

## 2015-02-26 LAB — PROTEIN, CSF: Total  Protein, CSF: 36 mg/dL (ref 15–45)

## 2015-02-26 LAB — CBC
HCT: 41.9 % (ref 36.0–46.0)
Hemoglobin: 12.8 g/dL (ref 12.0–15.0)
MCH: 24.4 pg — AB (ref 26.0–34.0)
MCHC: 30.5 g/dL (ref 30.0–36.0)
MCV: 79.8 fL (ref 78.0–100.0)
PLATELETS: 239 10*3/uL (ref 150–400)
RBC: 5.25 MIL/uL — ABNORMAL HIGH (ref 3.87–5.11)
RDW: 15.1 % (ref 11.5–15.5)
WBC: 8.3 10*3/uL (ref 4.0–10.5)

## 2015-02-26 LAB — URINALYSIS, ROUTINE W REFLEX MICROSCOPIC
BILIRUBIN URINE: NEGATIVE
Glucose, UA: NEGATIVE mg/dL
KETONES UR: NEGATIVE mg/dL
Leukocytes, UA: NEGATIVE
Nitrite: NEGATIVE
PROTEIN: NEGATIVE mg/dL
Specific Gravity, Urine: 1.013 (ref 1.005–1.030)
pH: 7.5 (ref 5.0–8.0)

## 2015-02-26 LAB — GLUCOSE, CSF: Glucose, CSF: 74 mg/dL — ABNORMAL HIGH (ref 40–70)

## 2015-02-26 LAB — APTT: aPTT: 26 seconds (ref 24–37)

## 2015-02-26 LAB — URINE MICROSCOPIC-ADD ON: SQUAMOUS EPITHELIAL / LPF: NONE SEEN

## 2015-02-26 LAB — DIFFERENTIAL
BASOS PCT: 1 %
Basophils Absolute: 0 10*3/uL (ref 0.0–0.1)
EOS PCT: 3 %
Eosinophils Absolute: 0.2 10*3/uL (ref 0.0–0.7)
LYMPHS PCT: 20 %
Lymphs Abs: 1.7 10*3/uL (ref 0.7–4.0)
MONO ABS: 0.9 10*3/uL (ref 0.1–1.0)
Monocytes Relative: 11 %
Neutro Abs: 5.4 10*3/uL (ref 1.7–7.7)
Neutrophils Relative %: 65 %

## 2015-02-26 LAB — ETHANOL

## 2015-02-26 LAB — I-STAT TROPONIN, ED: Troponin i, poc: 0.01 ng/mL (ref 0.00–0.08)

## 2015-02-26 LAB — PROTIME-INR
INR: 0.95 (ref 0.00–1.49)
PROTHROMBIN TIME: 12.9 s (ref 11.6–15.2)

## 2015-02-26 MED ORDER — DIPHENHYDRAMINE HCL 50 MG/ML IJ SOLN
25.0000 mg | Freq: Once | INTRAMUSCULAR | Status: AC
  Administered 2015-02-26: 25 mg via INTRAVENOUS
  Filled 2015-02-26: qty 1

## 2015-02-26 MED ORDER — DEXAMETHASONE SODIUM PHOSPHATE 10 MG/ML IJ SOLN
10.0000 mg | Freq: Once | INTRAMUSCULAR | Status: AC
  Administered 2015-02-26: 10 mg via INTRAVENOUS
  Filled 2015-02-26: qty 1

## 2015-02-26 MED ORDER — METOCLOPRAMIDE HCL 5 MG/ML IJ SOLN
10.0000 mg | Freq: Once | INTRAMUSCULAR | Status: AC
  Administered 2015-02-26: 10 mg via INTRAVENOUS
  Filled 2015-02-26: qty 2

## 2015-02-26 MED ORDER — LORAZEPAM 2 MG/ML IJ SOLN
1.0000 mg | Freq: Once | INTRAMUSCULAR | Status: AC
  Administered 2015-02-26: 1 mg via INTRAVENOUS
  Filled 2015-02-26: qty 1

## 2015-02-26 MED ORDER — LABETALOL HCL 5 MG/ML IV SOLN
10.0000 mg | Freq: Once | INTRAVENOUS | Status: AC
  Administered 2015-02-26: 10 mg via INTRAVENOUS
  Filled 2015-02-26: qty 4

## 2015-02-26 MED ORDER — HALOPERIDOL LACTATE 5 MG/ML IJ SOLN
2.0000 mg | Freq: Once | INTRAMUSCULAR | Status: AC
  Administered 2015-02-26: 2 mg via INTRAVENOUS
  Filled 2015-02-26: qty 1

## 2015-02-26 MED ORDER — MAGNESIUM SULFATE 2 GM/50ML IV SOLN
2.0000 g | Freq: Once | INTRAVENOUS | Status: DC
  Filled 2015-02-26: qty 50

## 2015-02-26 MED ORDER — LABETALOL HCL 5 MG/ML IV SOLN: 10.0000 mg | Freq: Once | INTRAVENOUS | Status: DC

## 2015-02-26 MED ORDER — POLYVINYL ALCOHOL 1.4 % OP SOLN
1.0000 [drp] | OPHTHALMIC | Status: DC | PRN
  Administered 2015-02-26 (×2): 1 [drp] via OPHTHALMIC
  Filled 2015-02-26: qty 15

## 2015-02-26 MED ORDER — SODIUM CHLORIDE 0.9 % IV BOLUS (SEPSIS)
1000.0000 mL | Freq: Once | INTRAVENOUS | Status: AC
  Administered 2015-02-26: 1000 mL via INTRAVENOUS

## 2015-02-26 NOTE — ED Notes (Signed)
Attempted to ambulate pt in hall , pt was de stating/ provider notified

## 2015-02-26 NOTE — ED Notes (Signed)
Per EMS, Pt, from home, c/o acute dizziness and weakness starting around 1515.  Denies pain.  Pt reports that she was watching TV when symptoms began.  Facial symmetry noted.  Pt reported R sided weakness, but was noted to be using R arm to push off the stretcher and hold onto Paramedics.  Bilateral leg weakness noted.  Hx of anxiety and HTN.

## 2015-02-26 NOTE — ED Notes (Signed)
Patient transported to MRI 

## 2015-02-26 NOTE — ED Notes (Signed)
Pt reports blurred vision started while watching tv and chronic headache. Pain score 8/10.  Pt reports taking Tramadol for pain.  Sts "I have chronic dry eye and it causes me to have a headache."

## 2015-02-26 NOTE — ED Notes (Signed)
During ambulation pt was very unstable.

## 2015-02-27 LAB — CSF CELL COUNT WITH DIFFERENTIAL
RBC Count, CSF: 0 /mm3
RBC Count, CSF: 6 /mm3 — ABNORMAL HIGH
Tube #: 1
Tube #: 4
WBC, CSF: 0 /mm3 (ref 0–5)
WBC, CSF: 0 /mm3 (ref 0–5)

## 2015-02-27 LAB — LACTATE DEHYDROGENASE, PLEURAL OR PERITONEAL FLUID: LD FL: 25 U/L — AB (ref 3–23)

## 2015-02-27 NOTE — ED Provider Notes (Signed)
CSN: [id number]     Arrival date & time 02/26/15  1552 History   First MD Initiated Contact with Patient 02/26/15 1606     Chief Complaint  Patient presents with  . Dizziness  . Weakness     (Consider location/radiation/quality/duration/timing/severity/associated sxs/prior Treatment) Patient is a 70 y.o. female presenting with dizziness.  Dizziness Quality:  Lightheadedness Severity:  Mild Duration:  1 day Timing:  Constant Chronicity:  Recurrent Relieved by:  None tried Worsened by:  Nothing Ineffective treatments:  None tried Associated symptoms: weakness   Associated symptoms: no blood in stool, no nausea, no palpitations and no shortness of breath     Past Medical History  Diagnosis Date  . Hypertension   . Hyperlipidemia   . Environmental allergies   . GERD (gastroesophageal reflux disease)   . Goiter   . Osteoporosis   . Hematuria   . Anxiety   . Cervicalgia   . FH: colonic polyps   . Weight loss    Past Surgical History  Procedure Laterality Date  . Nasal sinus surgery    . Back surgery    . Neck surgery     Family History  Problem Relation Age of Onset  . Heart disease Father   . Heart disease Brother   . Lung cancer Sister     smoked   Social History  Substance Use Topics  . Smoking status: Never Smoker   . Smokeless tobacco: Never Used  . Alcohol Use: No   OB History    No data available     Review of Systems  Constitutional: Negative for fever and chills.  HENT: Negative for congestion and drooling.   Eyes: Negative for pain and redness.  Respiratory: Negative for cough and shortness of breath.   Cardiovascular: Negative for palpitations.  Gastrointestinal: Negative for nausea and blood in stool.  Genitourinary: Negative for dysuria, flank pain and enuresis.  Neurological: Positive for dizziness and weakness.  All other systems reviewed and are negative.     Allergies  Asa; Codeine; and Sudafed  Home Medications   Prior to  Admission medications   Medication Sig Start Date End Date Taking? Authorizing Provider  ALPHAGAN P 0.1 % SOLN INSTILL 1 DROP IN BOTH EYES THREE TIMES A DAY 12/24/14  Yes Historical Provider, MD  aspirin 81 MG tablet Take 81 mg by mouth daily.   Yes Historical Provider, MD  BETIMOL 0.5 % ophthalmic solution Place 1 drop into both eyes daily. Each eye once per day 11/06/12  Yes Historical Provider, MD  Calcium Carbonate-Vitamin D (CALCIUM 500 + D PO) Take 1 tablet by mouth daily.   Yes Historical Provider, MD  carvedilol (COREG) 12.5 MG tablet Take 12.5 mg by mouth 2 (two) times daily with a meal.   Yes Historical Provider, MD  hydrochlorothiazide (HYDRODIURIL) 25 MG tablet Take 0.5 tablets by mouth daily. 11/05/12  Yes Historical Provider, MD  latanoprost (XALATAN) 0.005 % ophthalmic solution Place 1 drop into both eyes 2 (two) times daily.  12/22/12  Yes Historical Provider, MD  LORazepam (ATIVAN) 0.5 MG tablet Take 0.5 mg by mouth 2 (two) times daily.   Yes Historical Provider, MD  rosuvastatin (CRESTOR) 10 MG tablet Take 10 mg by mouth daily.   Yes Historical Provider, MD  traMADol (ULTRAM) 50 MG tablet Take 1 tablet by mouth every 6 (six) hours as needed for moderate pain or severe pain.  12/19/12  Yes Historical Provider, MD   BP 127/70 mmHg  Pulse  71  Temp(Src) 98.1 F (36.7 C) (Oral)  Resp 14  Ht 5\' 4"  (1.626 m)  Wt 112 lb (50.803 kg)  BMI 19.22 kg/m2  SpO2 96% Physical Exam  Constitutional: She is oriented to person, place, and time. She appears well-developed and well-nourished.  HENT:  Head: Normocephalic and atraumatic.  Neck: Normal range of motion.  Cardiovascular: Normal rate and regular rhythm.   Pulmonary/Chest: Effort normal and breath sounds normal. No stridor. No respiratory distress.  Abdominal: Soft. She exhibits no distension. There is no tenderness. There is no rebound.  Musculoskeletal: Normal range of motion. She exhibits no edema or tenderness.  Neurological: She  is alert and oriented to person, place, and time. No cranial nerve deficit. Coordination normal.  Skin: Skin is warm and dry. No rash noted. No erythema.  Nursing note and vitals reviewed.   ED Course  .Lumbar Puncture Date/Time: 02/27/2015 11:26 PM Performed by: Marily MemosMESNER, Julien Berryman Authorized by: Marily MemosMESNER, Pablo Mathurin Consent: Verbal consent obtained. Written consent obtained. Risks and benefits: risks, benefits and alternatives were discussed Consent given by: patient Patient understanding: patient states understanding of the procedure being performed Patient consent: the patient's understanding of the procedure matches consent given Indications: evaluation for infection and evaluation for subarachnoid hemorrhage Anesthesia: local infiltration Local anesthetic: lidocaine 1% with epinephrine Anesthetic total: 2.5 ml Patient sedated: no Lumbar space: L3-L4 interspace Patient's position: left lateral decubitus Needle gauge: 22 Needle type: spinal needle - Quincke tip Needle length: 3.5 in Number of attempts: 1 Fluid appearance: clear Tubes of fluid: 4 Total volume: 5 ml Post-procedure: site cleaned Patient tolerance: Patient tolerated the procedure well with no immediate complications   (including critical care time) Labs Review Labs Reviewed  CBC - Abnormal; Notable for the following:    RBC 5.25 (*)    MCH 24.4 (*)    All other components within normal limits  COMPREHENSIVE METABOLIC PANEL - Abnormal; Notable for the following:    Glucose, Bld 123 (*)    All other components within normal limits  URINALYSIS, ROUTINE W REFLEX MICROSCOPIC (NOT AT [facility]) - Abnormal; Notable for the following:    Hgb urine dipstick TRACE (*)    All other components within normal limits  URINE MICROSCOPIC-ADD ON - Abnormal; Notable for the following:    Bacteria, UA RARE (*)    All other components within normal limits  GLUCOSE, CSF - Abnormal; Notable for the following:    Glucose, CSF 74 (*)    All  other components within normal limits  LACTATE DEHYDROGENASE, BODY FLUID - Abnormal; Notable for the following:    LD, Fluid 25 (*)    All other components within normal limits  CSF CELL COUNT WITH DIFFERENTIAL - Abnormal; Notable for the following:    RBC Count, CSF 6 (*)    All other components within normal limits  CSF CULTURE  ETHANOL  PROTIME-INR  APTT  DIFFERENTIAL  PROTEIN, CSF  CSF CELL COUNT WITH DIFFERENTIAL  I-STAT TROPOININ, ED    Imaging Review Ct Head Wo Contrast  02/26/2015  CLINICAL DATA:  Acute onset of dizziness and weakness since 3 o'clock this afternoon. EXAM: CT HEAD WITHOUT CONTRAST TECHNIQUE: Contiguous axial images were obtained from the base of the skull through the vertex without intravenous contrast. COMPARISON:  Head CT 09/25/2013 FINDINGS: The ventricles are normal in size and configuration. No extra-axial fluid collections are identified. The gray-white differentiation is normal. No CT findings for acute intracranial process such as hemorrhage or infarction. No mass lesions.  The brainstem and cerebellum are grossly normal. The bony structures are intact. The paranasal sinuses and mastoid air cells are clear. The globes are intact. IMPRESSION: Normal head CT. Electronically Signed   By: Rudie Meyer M.D.   On: 02/26/2015 17:23   Mr Brain Wo Contrast  02/26/2015  CLINICAL DATA:  70 year old female with acute onset of severe headache with dizziness. Right-sided weakness. Subsequent encounter. EXAM: MRI HEAD WITHOUT CONTRAST TECHNIQUE: Multiplanar, multiecho pulse sequences of the brain and surrounding structures were obtained without intravenous contrast. COMPARISON:  02/26/2015 head CT.  08/07/2006 brain MR. FINDINGS: No acute infarct. Subtle abnormal signal within right frontal sulci which persisted on repeat imaging suggestive of small amount of subarachnoid hemorrhage. Secondary consideration is proteinaceous material as can be seen with infection. Very mild small  vessel disease changes. No hydrocephalus. No intracranial mass lesion noted on this unenhanced exam. Major intracranial vascular structures are patent. Partial opacification mastoid air cells greater on the right without obstructing lesion of the eustachian tube noted. Minimal mucosal thickening ethmoid sinus air cells. Cervical medullary junction, pituitary region, pineal region and orbital structures unremarkable. IMPRESSION: Subtle abnormal signal within right frontal sulci which persisted on repeat imaging suggestive of small amount of subarachnoid hemorrhage. Secondary consideration is proteinaceous material as can be seen with infection. No acute infarct. Partial opacification mastoid air cells greater on the right. These results were called by telephone at the time of interpretation on 02/26/2015 at 8:27 pm to Dr. Marily Memos , who verbally acknowledged these results. Electronically Signed   By: Lacy Duverney M.D.   On: 02/26/2015 22:28   I have personally reviewed and evaluated these images and lab results as part of my medical decision-making.   EKG Interpretation   Date/Time:  Thursday February 26 2015 17:08:53 EST Ventricular Rate:  60 PR Interval:  142 QRS Duration: 93 QT Interval:  423 QTC Calculation: 423 R Axis:   -10 Text Interpretation:  Sinus rhythm No significant change since last  tracing Confirmed by [facility] MD, Barbara Cower [id number]) on 02/26/2015 8:20:33 PM      MDM   Final diagnoses:  Nonintractable headache, unspecified chronicity pattern, unspecified headache type    70 year old female with history of headaches presents emergency department today with a bad headache associated with the dizziness and weakness. Difficulty walking which she falls and the walls. CT scan negative rest of labs negative MRI done to evaluate for stroke. MRI showed a signal concerning for possible infection versus bleeding. LP done and only 6 red blood cells and first to clear by fourth tube doubt  subarachnoid hemorrhage. No white blood cells doubt meningitis. Feel it is likely migraine symptoms improved for discharge. Will follow with primary doctor.  Marily Memos, MD 02/27/15 2329

## 2015-03-02 LAB — CSF CULTURE

## 2015-03-02 LAB — CSF CULTURE W GRAM STAIN: Culture: NO GROWTH

## 2015-06-07 ENCOUNTER — Emergency Department (HOSPITAL_COMMUNITY): Payer: Medicare Other

## 2015-06-07 ENCOUNTER — Emergency Department (HOSPITAL_COMMUNITY)
Admission: EM | Admit: 2015-06-07 | Discharge: 2015-06-07 | Disposition: A | Discharge status: Home / Self Care | Payer: Medicare Other | Attending: Emergency Medicine | Admitting: Emergency Medicine

## 2015-06-07 ENCOUNTER — Encounter (HOSPITAL_COMMUNITY): Payer: Self-pay | Admitting: *Deleted

## 2015-06-07 DIAGNOSIS — S6991XA Unspecified injury of right wrist, hand and finger(s), initial encounter: Secondary | ICD-10-CM | POA: Diagnosis not present

## 2015-06-07 DIAGNOSIS — Y998 Other external cause status: Secondary | ICD-10-CM | POA: Insufficient documentation

## 2015-06-07 DIAGNOSIS — Z8719 Personal history of other diseases of the digestive system: Secondary | ICD-10-CM | POA: Diagnosis not present

## 2015-06-07 DIAGNOSIS — F419 Anxiety disorder, unspecified: Secondary | ICD-10-CM | POA: Diagnosis not present

## 2015-06-07 DIAGNOSIS — Z79899 Other long term (current) drug therapy: Secondary | ICD-10-CM | POA: Diagnosis not present

## 2015-06-07 DIAGNOSIS — I10 Essential (primary) hypertension: Secondary | ICD-10-CM | POA: Diagnosis not present

## 2015-06-07 DIAGNOSIS — E785 Hyperlipidemia, unspecified: Secondary | ICD-10-CM | POA: Diagnosis not present

## 2015-06-07 DIAGNOSIS — M810 Age-related osteoporosis without current pathological fracture: Secondary | ICD-10-CM | POA: Diagnosis not present

## 2015-06-07 DIAGNOSIS — W228XXA Striking against or struck by other objects, initial encounter: Secondary | ICD-10-CM | POA: Diagnosis not present

## 2015-06-07 DIAGNOSIS — Y9289 Other specified places as the place of occurrence of the external cause: Secondary | ICD-10-CM | POA: Diagnosis not present

## 2015-06-07 DIAGNOSIS — Z7982 Long term (current) use of aspirin: Secondary | ICD-10-CM | POA: Insufficient documentation

## 2015-06-07 DIAGNOSIS — M79641 Pain in right hand: Secondary | ICD-10-CM

## 2015-06-07 DIAGNOSIS — Y9389 Activity, other specified: Secondary | ICD-10-CM | POA: Diagnosis not present

## 2015-06-07 MED ORDER — HYDROCODONE-ACETAMINOPHEN 5-325 MG PO TABS: 2.0000 | ORAL_TABLET | ORAL | Status: DC | PRN

## 2015-06-07 MED ORDER — HYDROCODONE-ACETAMINOPHEN 5-325 MG PO TABS
2.0000 | ORAL_TABLET | Freq: Once | ORAL | Status: AC
  Administered 2015-06-07: 2 via ORAL
  Filled 2015-06-07: qty 2

## 2015-06-07 NOTE — Discharge Instructions (Signed)

## 2015-06-07 NOTE — ED Notes (Signed)
Provided pillow for right arm to lay on for better support and comfort.

## 2015-06-07 NOTE — ED Notes (Signed)
Pt reports hitting the top of her R hand on the corner of the BR cabinet this am around 1000, started to have pain in her hand which is now radiating all the way up to her arm.  Pain is excruciating she is unable to lift her R arm up and her fingertips are turning blue.  Pt is crying in triage d/t pain.

## 2015-06-07 NOTE — ED Notes (Signed)
Patient was provided an Ace Wrap and ice pack for comfort and to go home with. Pt reports it feels better with the ace wrap and able to move fingers more since comfort measures have been provided.

## 2015-06-07 NOTE — ED Provider Notes (Signed)
CSN: [id number]     Arrival date & time 06/07/15  1520 History   First MD Initiated Contact with Patient 06/07/15 1617     Chief Complaint  Patient presents with  . Arm Pain     (Consider location/radiation/quality/duration/timing/severity/associated sxs/prior Treatment) Patient is a 70 y.o. female presenting with arm pain. The history is provided by the patient. No language interpreter was used.  Arm Pain This is a new problem. The current episode started yesterday. The problem occurs constantly. The problem has been unchanged. Associated symptoms include joint swelling. Nothing aggravates the symptoms. She has tried nothing for the symptoms. The treatment provided moderate relief.  Pt complains of pain in her right hand. Pt reports she hit a cabinet.  Pt reports she hit her hand at 10am.  She reports her hand and arm became painful at 1:30pm.   Pt reports she has pain shooting up from her hand to her elbow and shoulder.  Husband concerned because veins turned blue and hand looked purple.    Past Medical History  Diagnosis Date  . Hypertension   . Hyperlipidemia   . Environmental allergies   . GERD (gastroesophageal reflux disease)   . Goiter   . Osteoporosis   . Hematuria   . Anxiety   . Cervicalgia   . FH: colonic polyps   . Weight loss    Past Surgical History  Procedure Laterality Date  . Nasal sinus surgery    . Back surgery    . Neck surgery     Family History  Problem Relation Age of Onset  . Heart disease Father   . Heart disease Brother   . Lung cancer Sister     smoked   Social History  Substance Use Topics  . Smoking status: Never Smoker   . Smokeless tobacco: Never Used  . Alcohol Use: No   OB History    No data available     Review of Systems  Musculoskeletal: Positive for joint swelling.  All other systems reviewed and are negative.     Allergies  Asa; Codeine; and Sudafed  Home Medications   Prior to Admission medications   Medication  Sig Start Date End Date Taking? Authorizing Provider  ALPHAGAN P 0.1 % SOLN INSTILL 1 DROP IN BOTH EYES THREE TIMES A DAY 12/24/14  Yes Historical Provider, MD  amitriptyline (ELAVIL) 25 MG tablet TAKE 1/2 TO 2 TABLETS IN THE EVENINGS AS NEEDED FOR INSOMNIA AND FIBROMYALGIA 04/02/15  Yes Historical Provider, MD  aspirin 81 MG tablet Take 81 mg by mouth daily.   Yes Historical Provider, MD  azelastine (ASTELIN) 0.1 % nasal spray USE 2 SPRAYS INTO EACH NOSTRIL TWICE A DAY 05/20/15  Yes Historical Provider, MD  BETIMOL 0.5 % ophthalmic solution Place 1 drop into both eyes daily. Each eye once per day 11/06/12  Yes Historical Provider, MD  Calcium Carbonate-Vitamin D (CALCIUM 500 + D PO) Take 1 tablet by mouth daily.   Yes Historical Provider, MD  hydrochlorothiazide (HYDRODIURIL) 25 MG tablet Take 0.5 tablets by mouth daily. 11/05/12  Yes Historical Provider, MD  latanoprost (XALATAN) 0.005 % ophthalmic solution Place 1 drop into both eyes 2 (two) times daily.  12/22/12  Yes Historical Provider, MD  LORazepam (ATIVAN) 0.5 MG tablet Take 0.5 mg by mouth 2 (two) times daily.   Yes Historical Provider, MD  Omega-3 Fatty Acids (FISH OIL) 1000 MG CAPS Take 1 capsule by mouth daily.   Yes Historical Provider, MD  BP 115/91 mmHg  Pulse 72  Temp(Src) 98.6 F (37 C)  Resp 17  SpO2 99% Physical Exam  Constitutional: She is oriented to person, place, and time. She appears well-developed and well-nourished.  HENT:  Head: Normocephalic and atraumatic.  Musculoskeletal: She exhibits tenderness.  No swelling,no bruising.  Good pulses. Normal cap refill,  Pain with movement.  Elbow nontender,  Shoulder nontender  Neurological: She is alert and oriented to person, place, and time. She has normal reflexes.  Skin: Skin is warm.  Psychiatric: She has a normal mood and affect.    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 No acute fracture or dislocation is seen. No gross soft tissue abnormality is noted. IMPRESSION: Degenerative change without acute abnormality. Electronically Signed   By: Alcide CleverMark  Lukens M.D.   On: 06/07/2015 17:01   I have personally reviewed and evaluated these images and lab results as part of my medical decision-making.   EKG Interpretation None      MDM   Final diagnoses:  Pain in right hand    Meds ordered this encounter  Medications  . DISCONTD: gabapentin (NEURONTIN) 300 MG capsule    Sig: Take 300 mg by mouth daily. Reported on 06/07/2015    Refill:  1  . DISCONTD: zonisamide (ZONEGRAN) 25 MG capsule    Sig: Reported on 06/07/2015    Refill:  1  . azelastine (ASTELIN) 0.1 % nasal spray    Sig: USE 2 SPRAYS INTO EACH NOSTRIL TWICE A DAY    Refill:  0  . DISCONTD: metroNIDAZOLE (METROGEL) 1 % gel    Sig: Reported on 06/07/2015    Refill:  1  . amitriptyline (ELAVIL) 25 MG tablet    Sig: TAKE 1/2 TO 2 TABLETS IN THE EVENINGS AS NEEDED FOR INSOMNIA AND FIBROMYALGIA    Refill:  1  . Omega-3 Fatty Acids (FISH OIL) 1000 MG CAPS    Sig: Take 1 capsule by mouth daily.  Marland Kitchen. HYDROcodone-acetaminophen (NORCO/VICODIN) 5-325 MG per tablet 2 tablet    Sig:   . HYDROcodone-acetaminophen (NORCO/VICODIN) 5-325 MG tablet    Sig: Take 2 tablets by mouth every 4 (four) hours as needed.    Dispense:  16 tablet    Refill:  0    Order Specific Question:  Supervising Provider    Answer:  Eber HongMILLER, BRIAN [[id number]]   Husband concerned because pt not moving hand.  I reexamined pt.  Pt is able to move shoulder, elbow,wrist.  Pt able to move fingers but complains of pain.  Pt given 2 hydrocodone and placed in  ace wrap.  We will observe to make sure range of motion improves  An After Visit Summary was printed and given to the patient.  Elson AreasLeslie K Sofia, PA-C 06/07/15 1744  Elson AreasLeslie K Sofia, PA-C 06/07/15 1804  Lavera Guiseana Duo Liu, MD 06/08/15 [phone]

## 2015-07-07 ENCOUNTER — Encounter: Payer: Medicare Other | Attending: Family Medicine | Admitting: Skilled Nursing Facility1

## 2015-07-07 VITALS — Ht 64.0 in | Wt 113.0 lb

## 2015-07-07 DIAGNOSIS — E119 Type 2 diabetes mellitus without complications: Secondary | ICD-10-CM | POA: Diagnosis not present

## 2015-07-08 ENCOUNTER — Encounter: Payer: Self-pay | Admitting: Skilled Nursing Facility1

## 2015-07-14 ENCOUNTER — Encounter: Payer: Medicare Other | Admitting: *Deleted

## 2015-07-14 ENCOUNTER — Encounter: Payer: Self-pay | Admitting: *Deleted

## 2015-07-14 DIAGNOSIS — E119 Type 2 diabetes mellitus without complications: Secondary | ICD-10-CM | POA: Diagnosis not present

## 2015-07-14 NOTE — Progress Notes (Signed)

## 2015-07-21 ENCOUNTER — Encounter: Payer: Self-pay | Admitting: Skilled Nursing Facility1

## 2015-07-21 ENCOUNTER — Encounter: Payer: Medicare Other | Admitting: Skilled Nursing Facility1

## 2015-07-21 DIAGNOSIS — E119 Type 2 diabetes mellitus without complications: Secondary | ICD-10-CM | POA: Diagnosis not present

## 2015-07-21 NOTE — Progress Notes (Signed)
Patient was seen on 07/21/15 for the third of a series of three diabetes self-management courses at the [facility]. The following learning objectives were met by the patient during this class:  . State the amount of activity recommended for healthy living . Describe activities suitable for individual needs . Identify ways to regularly incorporate activity into daily life . Identify barriers to activity and ways to over come these barriers  Identify diabetes medications being personally used and their primary action for lowering glucose and possible side effects . Describe role of stress on blood glucose and develop strategies to address psychosocial issues . Identify diabetes complications and ways to prevent them  Explain how to manage diabetes during illness . Evaluate success in meeting personal goal . Establish 2-3 goals that they will plan to diligently work on until they return for the  4-month follow-up visit  Goals:   I will count my carb choices at most meals and snacks  I will be active  minutes or more  times a week  I will take my diabetes medications as scheduled  I will eat less unhealthy fats by eating less   I will test my glucose at least  times a day,  days a week  I will look at patterns in my record book at least  days a month  To help manage stress I will   at least  times a week    Your patient has identified these potential barriers to change:  Motivation Finances Stress Lack of Family Support   Your patient has identified their diabetes self-care support plan as  [facility] Support Group Family Support Magazine Subscriptions On-line Resources    

## 2015-08-18 ENCOUNTER — Encounter: Payer: Self-pay | Admitting: Physician Assistant

## 2015-09-01 ENCOUNTER — Ambulatory Visit (INDEPENDENT_AMBULATORY_CARE_PROVIDER_SITE_OTHER): Payer: Medicare Other | Admitting: Physician Assistant

## 2015-09-01 ENCOUNTER — Encounter: Payer: Self-pay | Admitting: Physician Assistant

## 2015-09-01 ENCOUNTER — Encounter (INDEPENDENT_AMBULATORY_CARE_PROVIDER_SITE_OTHER): Payer: Self-pay

## 2015-09-01 VITALS — BP 100/60 | HR 64 | Ht 64.0 in | Wt 118.8 lb

## 2015-09-01 DIAGNOSIS — E785 Hyperlipidemia, unspecified: Secondary | ICD-10-CM | POA: Diagnosis not present

## 2015-09-01 DIAGNOSIS — R5383 Other fatigue: Secondary | ICD-10-CM | POA: Diagnosis not present

## 2015-09-01 DIAGNOSIS — I10 Essential (primary) hypertension: Secondary | ICD-10-CM | POA: Diagnosis not present

## 2015-09-01 DIAGNOSIS — R001 Bradycardia, unspecified: Secondary | ICD-10-CM

## 2015-09-01 NOTE — Patient Instructions (Signed)
Medication Instructions:  1. STOP YOUR HCTZ (FLUID PILL)  Labwork: NONE  Testing/Procedures: Your physician has recommended that you wear a 48 HOUR holter monitor. Holter monitors are medical devices that record the heart's electrical activity. Doctors most often use these monitors to diagnose arrhythmias. Arrhythmias are problems with the speed or rhythm of the heartbeat. The monitor is a small, portable device. You can wear one while you do your normal daily activities. This is usually used to diagnose what is causing palpitations/syncope (passing out).    Follow-Up: AS NEEDED AT THIS TIME  Any Other Special Instructions Will Be Listed Below (If Applicable).     If you need a refill on your cardiac medications before your next appointment, please call your pharmacy.

## 2015-09-01 NOTE — Progress Notes (Signed)
Cardiology Office Note    Date:  09/01/2015   ID:  [name], DOB 1946/01/08, MRN [id number]  PCP:  Stephanie Raider, MD  Cardiologist:  New   Chief Complaint: Establish care for bradycardia and fatigue  History of Present Illness:   Stephanie Henson is a 70 y.o. female HTN, HL, DM, GERD, CKD stage III, pulmonary nodules, untreated fibromyalgia and goiter who referred by PCP for evaluation of bradycardia and fatigue.   The patient was seen by PCP 08/18/15 for fatigue and weakness. EKG showed NSR at rate of 51 bpm. No acute ST or T wave changes. Lab work from PCP 08/18/15: Hgb of 12. TSH, 0.59, HgbA1c 7.0. LDL of 100 06/01/15.   The patient reported fatigue for the past 2-3 months. She has fatigue all the times however after 6 pm it gets worse. Admits to having dyspnea while climbing stairs which is chronic. The patient denies nausea, vomiting, fever, chest pain, palpitations,orthopnea, PND, dizziness, syncope, cough, congestion, abdominal pain, hematochezia, melena, lower extremity edema. No exertional chest pain. Never smoker.    Past Medical History  Diagnosis Date  . Hypertension   . Hyperlipidemia   . Environmental allergies   . GERD (gastroesophageal reflux disease)   . Goiter   . Osteoporosis   . Hematuria   . Anxiety   . Cervicalgia   . FH: colonic polyps   . Weight loss   . Diabetes mellitus (HCC)   . CKD (chronic kidney disease) stage 3, GFR 30-59 ml/min   . Solitary pulmonary nodule      ;RUL - 7mm on CT 04/2014; CT 10/2014 resolution of nodule on RUL however persistant smaller nodules throughouts the lungs  . Fibromyalgia     Past Surgical History  Procedure Laterality Date  . Nasal sinus surgery    . Back surgery    . Neck surgery      Current Medications: Prior to Admission medications   Medication Sig Start Date End Date Taking? Authorizing Provider  ALPHAGAN P 0.1 % SOLN INSTILL 1 DROP IN BOTH EYES THREE TIMES A DAY 12/24/14   Historical Provider, MD    amitriptyline (ELAVIL) 25 MG tablet TAKE 1/2 TO 2 TABLETS IN THE EVENINGS AS NEEDED FOR INSOMNIA AND FIBROMYALGIA 04/02/15   Historical Provider, MD  aspirin 81 MG tablet Take 81 mg by mouth daily.    Historical Provider, MD  azelastine (ASTELIN) 0.1 % nasal spray USE 2 SPRAYS INTO EACH NOSTRIL TWICE A DAY 05/20/15   Historical Provider, MD  BETIMOL 0.5 % ophthalmic solution Place 1 drop into both eyes daily. Each eye once per day 11/06/12   Historical Provider, MD  Calcium Carbonate-Vitamin D (CALCIUM 500 + D PO) Take 1 tablet by mouth daily.    Historical Provider, MD  hydrochlorothiazide (HYDRODIURIL) 25 MG tablet Take 0.5 tablets by mouth daily. 11/05/12   Historical Provider, MD  HYDROcodone-acetaminophen (NORCO/VICODIN) 5-325 MG tablet Take 2 tablets by mouth every 4 (four) hours as needed. 06/07/15   Elson Areas, PA-C  latanoprost (XALATAN) 0.005 % ophthalmic solution Place 1 drop into both eyes 2 (two) times daily.  12/22/12   Historical Provider, MD  LORazepam (ATIVAN) 0.5 MG tablet Take 0.5 mg by mouth 2 (two) times daily.    Historical Provider, MD  Omega-3 Fatty Acids (FISH OIL) 1000 MG CAPS Take 1 capsule by mouth daily.    Historical Provider, MD    Allergies:   Asa; Codeine; and Sudafed   Social History  Social History  . Marital Status: Married    Spouse Name: N/A  . Number of Children: N/A  . Years of Education: N/A   Social History Main Topics  . Smoking status: Never Smoker   . Smokeless tobacco: Never Used  . Alcohol Use: No  . Drug Use: No  . Sexual Activity: Not Asked   Other Topics Concern  . None   Social History Narrative     Family History:  The patient's family history includes Heart disease in her brother and father; Lung cancer in her sister.   ROS:   Please see the history of present illness.    ROS All other systems reviewed and are negative.   PHYSICAL EXAM:   VS:  BP 100/60 mmHg  Pulse 64  Ht 5\' 4"  (1.626 m)  Wt 118 lb 12.8 oz (53.887 kg)   BMI 20.38 kg/m2   GEN: Well nourished, well developed, in no acute distress HEENT: normal Neck: no JVD, carotid bruits, or masses Cardiac: RRR; no murmurs, rubs, or gallops,no edema  Respiratory:  clear to auscultation bilaterally, normal work of breathing GI: soft, nontender, nondistended, + BS MS: no deformity or atrophy Skin: warm and dry, no rash Neuro:  Alert and Oriented x 3, Strength and sensation are intact Psych: euthymic mood, full affect  Wt Readings from Last 3 Encounters:  09/01/15 118 lb 12.8 oz (53.887 kg)  07/08/15 113 lb (51.256 kg)  02/26/15 112 lb (50.803 kg)      Studies/Labs Reviewed:   EKG:  EKG is ordered today.  The ekg ordered today demonstrates NSR at rate of 63 bpm.   Recent Labs: 02/26/2015: ALT 19; BUN 20; Creatinine, Ser 0.90; Hemoglobin 12.8; Platelets 239; Potassium 3.5; Sodium 140   Lab work from PCP 08/18/15: Hgb of 12. TSH, 0.59, HgbA1c 7.0.  Lipid Panel 06/01/15  CHOL 178, TRIG 118, HDL 55 , LDL 100  Additional studies/ records that were reviewed today include:     ASSESSMENT & PLAN:   1. Fatigue - No clear cardiac etiology. Seems her symptoms likely due to untreated fibromyalgia. Hgb 12 however has low MCV, MCH, and MCHC. ? Iron deficiency or not enough calories intake. Will defer further evaluation per PCP.   2. Bradycardia - EKG at PCP office showed HR of 51. EKG today showed HR of 63. Denies dizziness or syncope. She is not an athlete. Will place her on 48 hours holder monitor to get baseline HR.   3. HTN - BP of 100/60 today. Will discontinue HCTZ for now. Advised to keep log and f/u with PCP. Resume as needed.   4. HLD - Continue fish oil. Per PCP.    Follow up as needed unless abnormal result of monitor. Otherwise f/u with PCP.     Medication Adjustments/Labs and Tests Ordered: Current medicines are reviewed at length with the patient today.  Concerns regarding medicines are outlined above.  Medication changes, Labs and  Tests ordered today are listed in the Patient Instructions below. Patient Instructions  Medication Instructions:  1. STOP YOUR HCTZ (FLUID PILL)  Labwork: NONE  Testing/Procedures: Your physician has recommended that you wear a 48 HOUR holter monitor. Holter monitors are medical devices that record the heart's electrical activity. Doctors most often use these monitors to diagnose arrhythmias. Arrhythmias are problems with the speed or rhythm of the heartbeat. The monitor is a small, portable device. You can wear one while you do your normal daily activities. This is usually used to  diagnose what is causing palpitations/syncope (passing out).    Follow-Up: AS NEEDED AT THIS TIME  Any Other Special Instructions Will Be Listed Below (If Applicable).     If you need a refill on your cardiac medications before your next appointment, please call your pharmacy.       Lorelei Pont, [place]  09/01/2015 2:05 PM    [facility] [address] [place], [place], [place]  [address] Phone: [phone]; Fax: [phone]

## 2015-09-03 ENCOUNTER — Ambulatory Visit (INDEPENDENT_AMBULATORY_CARE_PROVIDER_SITE_OTHER): Payer: Medicare Other

## 2015-09-03 DIAGNOSIS — R001 Bradycardia, unspecified: Secondary | ICD-10-CM | POA: Diagnosis not present

## 2015-09-16 ENCOUNTER — Ambulatory Visit (INDEPENDENT_AMBULATORY_CARE_PROVIDER_SITE_OTHER): Payer: Medicare Other | Admitting: Physician Assistant

## 2015-09-16 ENCOUNTER — Telehealth: Payer: Self-pay

## 2015-09-16 ENCOUNTER — Ambulatory Visit (INDEPENDENT_AMBULATORY_CARE_PROVIDER_SITE_OTHER): Payer: Medicare Other

## 2015-09-16 VITALS — BP 134/80 | HR 68 | Temp 97.8°F | Resp 18 | Ht 64.0 in

## 2015-09-16 DIAGNOSIS — H409 Unspecified glaucoma: Secondary | ICD-10-CM | POA: Insufficient documentation

## 2015-09-16 DIAGNOSIS — M79645 Pain in left finger(s): Secondary | ICD-10-CM

## 2015-09-16 DIAGNOSIS — Z23 Encounter for immunization: Secondary | ICD-10-CM

## 2015-09-16 DIAGNOSIS — S61211A Laceration without foreign body of left index finger without damage to nail, initial encounter: Secondary | ICD-10-CM

## 2015-09-16 MED ORDER — CEPHALEXIN 500 MG PO CAPS: 500.0000 mg | ORAL_CAPSULE | Freq: Three times a day (TID) | ORAL | 0 refills | Status: AC

## 2015-09-16 NOTE — Progress Notes (Signed)
Stephanie Henson  MRN: [id number] DOB: Jan 25, 1946  Subjective:  Pt presents to clinic with a laceration to her left index finger that happened this am - she was doing yard work and she came down on her finger against a cinder block and got a cut - it bleed a lot and hurts at the laceration.  She is unsure when her last tetanus vaccine was.  Review of Systems  Constitutional: Negative for chills and fever.  Skin: Positive for wound.    Patient Active Problem List   Diagnosis Date Noted  . Glaucoma 09/16/2015  . Pulmonary nodules 12/26/2012  . Chronic rhinitis 12/26/2012    Current Outpatient Prescriptions on File Prior to Visit  Medication Sig Dispense Refill  . ALPHAGAN P 0.1 % SOLN INSTILL 1 DROP IN BOTH EYES THREE TIMES A DAY  99  . aspirin 81 MG tablet Take 81 mg by mouth daily.    Marland Kitchen azelastine (ASTELIN) 0.1 % nasal spray USE 2 SPRAYS INTO EACH NOSTRIL TWICE A DAY  0  . BETIMOL 0.5 % ophthalmic solution Place 1 drop into both eyes daily. Each eye once per day    . Calcium Carbonate-Vitamin D (CALCIUM 500 + D PO) Take 1 tablet by mouth daily.    Marland Kitchen latanoprost (XALATAN) 0.005 % ophthalmic solution Place 1 drop into both eyes 2 (two) times daily.     Marland Kitchen LORazepam (ATIVAN) 0.5 MG tablet Take 0.5 mg by mouth 2 (two) times daily.    . Omega-3 Fatty Acids (FISH OIL) 1000 MG CAPS Take 2 capsules by mouth daily.      No current facility-administered medications on file prior to visit.     Allergies  Allergen Reactions  . Asa [Aspirin] Nausea Only    Only 325 mg dose   . Codeine Other (See Comments)    Heart flutters  . Sudafed [Pseudoephedrine Hcl]     Objective:  BP 134/80   Pulse 68   Temp 97.8 F (36.6 C) (Oral)   Resp 18   Ht  (1.626 m)   SpO2 99%   Physical Exam  Constitutional: She is oriented to person, place, and time and well-developed, well-nourished, and in no distress.  HENT:  Head: Normocephalic and atraumatic.  Right Ear: Hearing and external ear  normal.  Left Ear: Hearing and external ear normal.  Eyes: Conjunctivae are normal.  Neck: Normal range of motion.  Pulmonary/Chest: Effort normal.  Neurological: She is alert and oriented to person, place, and time. Gait normal.  Skin: Skin is warm and dry.  1.5cm flap laceration to her extensor surface of the skin over the DIP with ecchymosis on the flexor surface under that area.  Psychiatric: Mood, memory, affect and judgment normal.  Vitals reviewed.  Procedure:  Consent obtained.  Local anesthesia with 2% lido - wound cleaned and explored - the tendon was abraided but not fully lacerated - she had good strength.  The wound was closed with 5-0 Ethilon #3 horizontal sutures.  Drsg and splint apllied.  Dg Finger Index Left  Result Date: 09/16/2015 CLINICAL DATA:  Injury after hitting on center bloc. EXAM: LEFT INDEX FINGER 2+V COMPARISON:  None. FINDINGS: Tiny fragments posterior to the DIP joint appear to have overlying laceration and could represent cortical injury to the base of the distal phalanx. Degenerative changes are noted of both the IP joints. IMPRESSION: Question open cortical injury to the posterior base of the distal phalanx. Electronically Signed   By: Minerva Areola  Molli Posey M.D.   On: 09/16/2015 09:10   Assessment and Plan :  Pain of finger of left hand - Plan: DG Finger Index Left  Laceration of left index finger w/o foreign body w/o damage to nail, initial encounter - Plan: Td vaccine greater than or equal to 7yo preservative free IM, cephALEXin (KEFLEX) 500 MG capsule - wound closed and abx started due to tendon abrasion and possible fracture - wound care d/w pt and she will wear the splint until she is seen to remove the stitches at that time we will reassess for the possible fracture and the need to continue with the splint - her questions were answered and she agreed with the plan.  Benny Lennert PA-C  [facility] 09/16/2015 9:53  AM

## 2015-09-16 NOTE — Patient Instructions (Addendum)
Take all the antibiotics to prevent infection. Wear the splint until I see you back.   WOUND CARE Please return in 10 days to have your stitches/staples removed or sooner if you have concerns.  Marland Kitchen Keep area clean and dry for 24 hours. Do not remove bandage, if applied. . After 24 hours, remove bandage and wash wound gently with mild soap and warm water. Reapply a new bandage after cleaning wound, if directed. . Continue daily cleansing with soap and water until stitches/staples are removed. . Do not apply any ointments or creams to the wound while stitches/staples are in place, as this may cause delayed healing. . Notify the office if you experience any of the following signs of infection: Swelling, redness, pus drainage, streaking, fever >101.0 F . Notify the office if you experience excessive bleeding that does not stop after 15-20 minutes of constant, firm pressure.     IF you received an x-ray today, you will receive an invoice from [facility]. Please contact [facility] at [phone] with questions or concerns regarding your invoice.   IF you received labwork today, you will receive an invoice from [facility]. Please contact [facility] at [phone] with questions or concerns regarding your invoice.   Our billing staff will not be able to assist you with questions regarding bills from these companies.  You will be contacted with the lab results as soon as they are available. The fastest way to get your results is to activate your My Chart account. Instructions are located on the last page of this paperwork. If you have not heard from [place] regarding the results in 2 weeks, please contact this office.

## 2015-09-16 NOTE — Telephone Encounter (Signed)
Pt states she was seen today and we have the pharmacy wrong it should be [facility] [address]

## 2015-09-24 ENCOUNTER — Ambulatory Visit: Payer: Medicare Other

## 2015-09-25 ENCOUNTER — Encounter: Payer: Self-pay | Admitting: Urgent Care

## 2015-09-25 ENCOUNTER — Encounter: Payer: Medicare Other | Admitting: Urgent Care

## 2015-09-25 ENCOUNTER — Ambulatory Visit (INDEPENDENT_AMBULATORY_CARE_PROVIDER_SITE_OTHER): Payer: Medicare Other | Admitting: Urgent Care

## 2015-09-25 VITALS — BP 140/82 | HR 67 | Temp 97.7°F | Resp 18 | Ht 64.0 in | Wt 119.0 lb

## 2015-09-25 DIAGNOSIS — S61211D Laceration without foreign body of left index finger without damage to nail, subsequent encounter: Secondary | ICD-10-CM

## 2015-09-25 DIAGNOSIS — Z4802 Encounter for removal of sutures: Secondary | ICD-10-CM

## 2015-09-25 NOTE — Progress Notes (Signed)
   Patient: Stephanie Henson [id number]  Subjective: Zea is returning for suture removal. Patient was initially seen 09/16/2015 and had 3 sutures placed. She has kept the wound covered. Admits occasional throbbing pain. Denies fever, drainage of pus or blood, wound dehiscence, edema.   Objective: Physical Exam  Constitutional: She is oriented to person, place, and time and well-developed, well-nourished, and in no distress.  Cardiovascular: Normal rate.   Pulmonary/Chest: Effort normal.  Musculoskeletal:       Left hand: She exhibits laceration (well approximated wound with surrounding granulated tissue) and swelling (trace over suture sites). She exhibits normal range of motion, no tenderness, no bony tenderness, normal two-point discrimination, normal capillary refill and no deformity. Normal sensation noted. Normal strength noted.  Neurological: She is alert and oriented to person, place, and time.  Skin: Skin is warm and dry.   #3 sutures removed without incident. Patient tolerated this well. No drainage was expressed.  Assessment and Plan: Well-healed wound. Anticipatory guidance provided. Return to clinic as needed.  Wallis Bamberg, PA-C [facility] [phone] 09/25/2015  3:22 PM

## 2015-09-25 NOTE — Patient Instructions (Signed)
     IF you received an x-ray today, you will receive an invoice from [facility]. Please contact  Radiology at [phone] with questions or concerns regarding your invoice.   IF you received labwork today, you will receive an invoice from [facility]. Please contact [facility] at [phone] with questions or concerns regarding your invoice.   Our billing staff will not be able to assist you with questions regarding bills from these companies.  You will be contacted with the lab results as soon as they are available. The fastest way to get your results is to activate your My Chart account. Instructions are located on the last page of this paperwork. If you have not heard from us regarding the results in 2 weeks, please contact this office.      

## 2015-10-07 ENCOUNTER — Telehealth: Payer: Self-pay

## 2015-10-07 NOTE — Telephone Encounter (Signed)
Patient wants to know if the antibiotic will work for sinus.   Her finger is red and sore.  She is not sure if her results were called to her.  She had stopped taking the antibiotic, will start back up now.   [phone] (H)

## 2015-10-08 NOTE — Telephone Encounter (Signed)
If her finger is still causing her issues, she needs to rtc for a recheck.

## 2015-10-09 NOTE — Telephone Encounter (Signed)
Patient notified and will come in next week.

## 2015-10-12 ENCOUNTER — Ambulatory Visit (INDEPENDENT_AMBULATORY_CARE_PROVIDER_SITE_OTHER): Payer: Medicare Other | Admitting: Emergency Medicine

## 2015-10-12 ENCOUNTER — Ambulatory Visit (INDEPENDENT_AMBULATORY_CARE_PROVIDER_SITE_OTHER): Payer: Medicare Other

## 2015-10-12 VITALS — BP 110/70 | HR 60 | Temp 98.2°F | Resp 18 | Ht 64.0 in | Wt 117.0 lb

## 2015-10-12 DIAGNOSIS — D649 Anemia, unspecified: Secondary | ICD-10-CM | POA: Diagnosis not present

## 2015-10-12 DIAGNOSIS — L03011 Cellulitis of right finger: Secondary | ICD-10-CM | POA: Diagnosis not present

## 2015-10-12 DIAGNOSIS — S62639G Displaced fracture of distal phalanx of unspecified finger, subsequent encounter for fracture with delayed healing: Secondary | ICD-10-CM | POA: Diagnosis not present

## 2015-10-12 LAB — POCT CBC
Granulocyte percent: 69.2 %G (ref 37–80)
HEMATOCRIT: 34.9 % — AB (ref 37.7–47.9)
HEMOGLOBIN: 11.5 g/dL — AB (ref 12.2–16.2)
LYMPH, POC: 1.6 (ref 0.6–3.4)
MCH: 23.8 pg — AB (ref 27–31.2)
MCHC: 33.1 g/dL (ref 31.8–35.4)
MCV: 71.8 fL — AB (ref 80–97)
MID (CBC): 0.7 (ref 0–0.9)
MPV: 9.1 fL (ref 0–99.8)
POC GRANULOCYTE: 5.1 (ref 2–6.9)
POC LYMPH PERCENT: 21.6 %L (ref 10–50)
POC MID %: 9.2 % (ref 0–12)
Platelet Count, POC: 221 10*3/uL (ref 142–424)
RBC: 4.85 M/uL (ref 4.04–5.48)
RDW, POC: 15.3 %
WBC: 7.4 10*3/uL (ref 4.6–10.2)

## 2015-10-12 LAB — GLUCOSE, POCT (MANUAL RESULT ENTRY): POC Glucose: 123 mg/dl — AB (ref 70–99)

## 2015-10-12 LAB — URIC ACID: URIC ACID, SERUM: 5.3 mg/dL (ref 2.5–7.0)

## 2015-10-12 LAB — IRON AND TIBC
%SAT: 4 % — ABNORMAL LOW (ref 11–50)
IRON: 21 ug/dL — AB (ref 45–160)
TIBC: 494 ug/dL — ABNORMAL HIGH (ref 250–450)
UIBC: 473 ug/dL — AB (ref 125–400)

## 2015-10-12 MED ORDER — CEPHALEXIN 500 MG PO CAPS: 500.0000 mg | ORAL_CAPSULE | Freq: Four times a day (QID) | ORAL | 0 refills | Status: DC

## 2015-10-12 MED ORDER — CEPHALEXIN 500 MG PO CAPS: 500.0000 mg | ORAL_CAPSULE | Freq: Two times a day (BID) | ORAL | 0 refills | Status: DC

## 2015-10-12 NOTE — Patient Instructions (Addendum)
Please clean your finger with soap and water 3 times a day. Take antibiotics twice a day. Please return to clinic to see me on Thursday.    IF you received an x-ray today, you will receive an invoice from [facility]. Please contact [facility] at [phone] with questions or concerns regarding your invoice.   IF you received labwork today, you will receive an invoice from [facility]. Please contact [facility] at [phone] with questions or concerns regarding your invoice.   Our billing staff will not be able to assist you with questions regarding bills from these companies.  You will be contacted with the lab results as soon as they are available. The fastest way to get your results is to activate your My Chart account. Instructions are located on the last page of this paperwork. If you have not heard from us regarding the results in 2 weeks, please contact this office.

## 2015-10-12 NOTE — Progress Notes (Signed)
Patient ID: Stephanie Henson, female   DOB: [birth year], 70 y.o.   MRN: [id number]    By signing my name below, I, Essence Howell, attest that this documentation has been prepared under the direction and in the presence of Collene GobbleSteven A Daub, MD Electronically Signed: Charline BillsEssence Howell, ED Scribe 10/12/2015 at 12:00 PM.  Chief Complaint:  Chief Complaint  Patient presents with  . Hand Pain    RIGHT INDEX FINGER SWELLING    HPI: Stephanie Henson is a 70 y.o. female [facility] complaining of gradually worsening right index finger pain for the past 3 weeks. Pt was seen in the office on 09/16/15 for a laceration sustained that morning from a cinder block. Laceration was repaired and pt was started on Keflex at that visit. Today, she reports gradually worsening burning pain, redness and swelling at the right index finger since the laceration was repaired.  No treatments tried PTA.   Past Medical History:  Diagnosis Date  . Anxiety   . Cervicalgia   . CKD (chronic kidney disease) stage 3, GFR 30-59 ml/min   . Diabetes mellitus (HCC)   . Environmental allergies   . FH: colonic polyps   . Fibromyalgia   . GERD (gastroesophageal reflux disease)   . Goiter   . Hematuria   . Hyperlipidemia   . Hypertension   . Osteoporosis   . Solitary pulmonary nodule     ;RUL - 7mm on CT 04/2014; CT 10/2014 resolution of nodule on RUL however persistant smaller nodules throughouts the lungs  . Weight loss    Past Surgical History:  Procedure Laterality Date  . BACK SURGERY    . NASAL SINUS SURGERY    . NECK SURGERY     Social History   Social History  . Marital status: Married    Spouse name: N/A  . Number of children: N/A  . Years of education: N/A   Social History Main Topics  . Smoking status: Never Smoker  . Smokeless tobacco: Never Used  . Alcohol use No  . Drug use: No  . Sexual activity: Not Asked   Other Topics Concern  . None   Social History Narrative  . None   Family  History  Problem Relation Age of Onset  . Heart disease Father   . Heart disease Brother   . Lung cancer Sister     smoked   Allergies  Allergen Reactions  . Asa [Aspirin] Nausea Only    Only 325 mg dose   . Codeine Other (See Comments)    Heart flutters  . Sudafed [Pseudoephedrine Hcl]    Prior to Admission medications   Medication Sig Start Date End Date Taking? Authorizing Provider  ALPHAGAN P 0.1 % SOLN INSTILL 1 DROP IN BOTH EYES THREE TIMES A DAY 12/24/14  Yes Historical Provider, MD  aspirin 81 MG tablet Take 81 mg by mouth daily.   Yes Historical Provider, MD  azelastine (ASTELIN) 0.1 % nasal spray USE 2 SPRAYS INTO EACH NOSTRIL TWICE A DAY 05/20/15  Yes Historical Provider, MD  BETIMOL 0.5 % ophthalmic solution Place 1 drop into both eyes daily. Each eye once per day 11/06/12  Yes Historical Provider, MD  Calcium Carbonate-Vitamin D (CALCIUM 500 + D PO) Take 1 tablet by mouth daily.   Yes Historical Provider, MD  latanoprost (XALATAN) 0.005 % ophthalmic solution Place 1 drop into both eyes 2 (two) times daily.  12/22/12  Yes Historical Provider, MD  LORazepam (  ATIVAN) 0.5 MG tablet Take 0.5 mg by mouth 2 (two) times daily.   Yes Historical Provider, MD  Omega-3 Fatty Acids (FISH OIL) 1000 MG CAPS Take 2 capsules by mouth daily.    Yes Historical Provider, MD   ROS: The patient denies fevers, chills, night sweats, unintentional weight loss, chest pain, palpitations, wheezing, dyspnea on exertion, nausea, vomiting, abdominal pain, dysuria, hematuria, melena, numbness, weakness, or tingling. +arthraglias, +joint swelling  All other systems have been reviewed and were otherwise negative with the exception of those mentioned in the HPI and as above.    PHYSICAL EXAM: Vitals:   10/12/15 1123  BP: 110/70  Pulse: 60  Resp: 18  Temp: 98.2 F (36.8 C)   Body mass index is 20.08 kg/m.  General: Alert, no acute distress HEENT:  Normocephalic, atraumatic, oropharynx patent. Eye:  Nonie HoyerOMI, [facility] Cardiovascular: Regular rate and rhythm, no rubs murmurs or gallops. No Carotid bruits, radial pulse intact. No pedal edema.  Respiratory: Clear to auscultation bilaterally. No wheezes, rales, or rhonchi. No cyanosis, no use of accessory musculature Abdominal: No organomegaly, abdomen is soft and non-tender, positive bowel sounds. No masses. Musculoskeletal: Gait intact. No edema. Exquisite tenderness over the DIP joint R index finger. Flexion and extension does not increase the pain.  Skin: No rashes. Neurologic: Facial musculature symmetric. Psychiatric: Patient acts appropriately throughout our interaction. Lymphatic: No cervical or submandibular lymphadenopathy  LABS: Results for orders placed or performed in visit on 10/12/15  POCT CBC  Result Value Ref Range   WBC 7.4 4.6 - 10.2 K/uL   Lymph, poc 1.6 0.6 - 3.4   POC LYMPH PERCENT 21.6 10 - 50 %L   MID (cbc) 0.7 0 - 0.9   POC MID % 9.2 0 - 12 %M   POC Granulocyte 5.1 2 - 6.9   Granulocyte percent 69.2 37 - 80 %G   RBC 4.85 4.04 - 5.48 M/uL   Hemoglobin 11.5 (A) 12.2 - 16.2 g/dL   HCT, POC 47.834.9 (A) 29.537.7 - 47.9 %   MCV 71.8 (A) 80 - 97 fL   MCH, POC 23.8 (A) 27 - 31.2 pg   MCHC 33.1 31.8 - 35.4 g/dL   RDW, POC 62.115.3 %   Platelet Count, POC 221 142 - 424 K/uL   MPV 9.1 0 - 99.8 fL  POCT glucose (manual entry)  Result Value Ref Range   POC Glucose 123 (A) 70 - 99 mg/dl  EKG/XRAY:   Primary [facility]  ASSESSMENT/PLAN: Hemoglobin low at 11.5 and MCV of 71. Will discuss at next office visit. She will be on cephalexin 500 twice a day. She does have a small fracture at the base of the distal phalanx of her right index finger. She has signs and symptoms consistent with a cellulitis. We'll recheck 48 hours.scribe  Gross sideeffects, risk and benefits, and alternatives of medications d/w patient. Patient is aware that all medications have potential sideeffects and we are unable to predict every  sideeffect or drug-drug interaction that may occur.  Lesle ChrisSteven Daub MD 10/12/2015 11:37 AM

## 2015-10-13 ENCOUNTER — Other Ambulatory Visit: Payer: Self-pay | Admitting: Emergency Medicine

## 2015-10-13 DIAGNOSIS — D509 Iron deficiency anemia, unspecified: Secondary | ICD-10-CM

## 2015-10-13 LAB — FERRITIN: Ferritin: 5 ng/mL — ABNORMAL LOW (ref 20–288)

## 2015-10-16 ENCOUNTER — Ambulatory Visit (INDEPENDENT_AMBULATORY_CARE_PROVIDER_SITE_OTHER): Payer: Medicare Other | Admitting: Family Medicine

## 2015-10-16 VITALS — BP 102/58 | HR 64 | Temp 98.5°F | Ht 64.0 in | Wt 118.0 lb

## 2015-10-16 DIAGNOSIS — L03011 Cellulitis of right finger: Secondary | ICD-10-CM | POA: Diagnosis not present

## 2015-10-16 DIAGNOSIS — D509 Iron deficiency anemia, unspecified: Secondary | ICD-10-CM

## 2015-10-16 LAB — POCT CBC
GRANULOCYTE PERCENT: 66.1 % (ref 37–80)
HCT, POC: 30.8 % — AB (ref 37.7–47.9)
HEMOGLOBIN: 10.2 g/dL — AB (ref 12.2–16.2)
Lymph, poc: 1.9 (ref 0.6–3.4)
MCH: 23.6 pg — AB (ref 27–31.2)
MCHC: 33.2 g/dL (ref 31.8–35.4)
MCV: 71.2 fL — AB (ref 80–97)
MID (CBC): 0.5 (ref 0–0.9)
MPV: 9.1 fL (ref 0–99.8)
POC Granulocyte: 4.6 (ref 2–6.9)
POC LYMPH PERCENT: 27.2 %L (ref 10–50)
POC MID %: 6.7 % (ref 0–12)
Platelet Count, POC: 196 10*3/uL (ref 142–424)
RBC: 4.32 M/uL (ref 4.04–5.48)
RDW, POC: 15.1 %
WBC: 6.9 10*3/uL (ref 4.6–10.2)

## 2015-10-16 NOTE — Patient Instructions (Addendum)
Gastroenterology will follow-up with you regarding your referral.  If you have not heard from them by Tuesday, please call the office Tuesday by 11:00 am.  Return for follow-up of right finger September 5th.  Request to see me.  If you start to experience dizziness, sever weakness, or see blood or develop dark tarry stools, please go immediately to the emergency department.  Godfrey PickKimberly S. Tiburcio PeaHarris, MSN, FNP-C [facility]        We recommend that you schedule a mammogram for breast cancer screening. Typically, you do not need a referral to do this. Please contact a local imaging center to schedule your mammogram.  [facility] - [phone]) [phone]  *ask for the Radiology Department [facility] - [phone] or [phone]) [phone]  [facility] - [phone]) [phone] [facility] - [phone](336) [phone] [facility] - [phone](336) [phone]  *ask for the Radiology Department [facility] - [phone]) [phone]  *ask for the Radiology Department [facility] - [phone]) [phone]  *ask for the Mammography Department [facility] - [phone](336) [phone]

## 2015-10-16 NOTE — Progress Notes (Signed)
Patient ID: Stephanie Henson, female    DOB: Feb 25, 1945, 70 y.o.   MRN: [id number]  PCP: Lupita Raider, MD  Chief Complaint  Patient presents with  . Follow-up    RIGHT FINGER    Subjective:   HPI Presents for evaluation of cellulitis of right index finger.  70 year old Caucasian female seen today for follow-up of her right index finger. She was previously seen by Dr. Cleta Alberts 10/12/15 in which she showed signs of cellulitis and infection of her right index finger. She was placed on an antibiotic of Keflex which she is still taking presently. She denies any fever still experiences mild pain but is able to weakly move finger in multiple directions.  She denies any exudate draining from finger.  Anemia Patient initially refused to have point care CBC per Dr. Juline Patch notes on 10/13/2015 he has placed a referral in for the patient to be further evaluated by gastroenterology for blood loss. She agree to have POCT CBC today to evaluate for further blood loss. Denies blood in stools. No vomiting.   . Social History   Social History  . Marital status: Married    Spouse name: N/A  . Number of children: N/A  . Years of education: N/A   Occupational History  . Not on file.   Social History Main Topics  . Smoking status: Never Smoker  . Smokeless tobacco: Never Used  . Alcohol use No  . Drug use: No  . Sexual activity: Not on file   Other Topics Concern  . Not on file   Social History Narrative  . No narrative on file    . Family History  Problem Relation Age of Onset  . Heart disease Father   . Heart disease Brother   . Lung cancer Sister     smoked   Review of Systems  Constitutional: Negative.   Gastrointestinal: Negative.   Skin:       See hpi    Patient Active Problem List   Diagnosis Date Noted  . Anemia 10/12/2015  . Glaucoma 09/16/2015  . Pulmonary nodules 12/26/2012  . Chronic rhinitis 12/26/2012     Prior to Admission medications   Medication Sig  Start Date End Date Taking? Authorizing Provider  ALPHAGAN P 0.1 % SOLN INSTILL 1 DROP IN BOTH EYES THREE TIMES A DAY 12/24/14  Yes Historical Provider, MD  aspirin 81 MG tablet Take 81 mg by mouth daily.   Yes Historical Provider, MD  azelastine (ASTELIN) 0.1 % nasal spray USE 2 SPRAYS INTO EACH NOSTRIL TWICE A DAY 05/20/15  Yes Historical Provider, MD  BETIMOL 0.5 % ophthalmic solution Place 1 drop into both eyes daily. Each eye once per day 11/06/12  Yes Historical Provider, MD  Calcium Carbonate-Vitamin D (CALCIUM 500 + D PO) Take 1 tablet by mouth daily.   Yes Historical Provider, MD  cephALEXin (KEFLEX) 500 MG capsule Take 1 capsule (500 mg total) by mouth 2 (two) times daily. 10/12/15  Yes Collene Gobble, MD  latanoprost (XALATAN) 0.005 % ophthalmic solution Place 1 drop into both eyes 2 (two) times daily.  12/22/12  Yes Historical Provider, MD  LORazepam (ATIVAN) 0.5 MG tablet Take 0.5 mg by mouth 2 (two) times daily.   Yes Historical Provider, MD  Omega-3 Fatty Acids (FISH OIL) 1000 MG CAPS Take 2 capsules by mouth daily.    Yes Historical Provider, MD     Allergies  Allergen Reactions  . Asa [Aspirin] Nausea Only  Only 325 mg dose   . Codeine Other (See Comments)    Heart flutters  . Sudafed [Pseudoephedrine Hcl]        Objective:  Physical Exam  Constitutional: She is oriented to person, place, and time. She appears well-developed and well-nourished.  HENT:  Head: Normocephalic and atraumatic.  Right Ear: External ear normal.  Left Ear: External ear normal.  Eyes: Conjunctivae are normal. Pupils are equal, round, and reactive to light.  Neck: Normal range of motion. Neck supple.  Cardiovascular: Normal rate.   Pulmonary/Chest: Effort normal.  Musculoskeletal:  Small erythematous circular lesion on the proximal interphalangeal joint. Closed lesion and mildly edematous. Non-pustular.  Neurological: She is alert and oriented to person, place, and time.  Skin: Skin is warm  and dry.  Vitals reviewed. . Vitals:   10/16/15 1453  BP: (!) 102/58  Pulse: 64  Temp: 98.5 F (36.9 C)     Assessment & Plan:  .1. Iron deficiency anemia, evaluating for worsening anemia. Dr. Ellis Parentsaub's lab notes indicate that he was considering gastroenterology  referral to evaluate for possible GI bleed. Plan:  POCT CBC, decreased from 11.5 to 10.2 g/dl. Gastroenterology referral  2. Cellulitis of right index finger, Appear to be healing and not expanding.  Finger has great mobility, warmth, and patient demonstrating minimal amount of pain. Plan: Complete course of cephalexin 500 mg twice daily Continue to keep finger in immobilizer to promote healing.  Godfrey PickKimberly S. Tiburcio PeaHarris, MSN, FNP-C [facility]

## 2015-10-27 ENCOUNTER — Ambulatory Visit (INDEPENDENT_AMBULATORY_CARE_PROVIDER_SITE_OTHER): Payer: Medicare Other | Admitting: Family Medicine

## 2015-10-27 ENCOUNTER — Ambulatory Visit (INDEPENDENT_AMBULATORY_CARE_PROVIDER_SITE_OTHER): Payer: Medicare Other

## 2015-10-27 VITALS — BP 130/74 | HR 69 | Temp 98.4°F | Resp 16 | Ht 64.5 in | Wt 119.4 lb

## 2015-10-27 DIAGNOSIS — S62600G Fracture of unspecified phalanx of right index finger, subsequent encounter for fracture with delayed healing: Secondary | ICD-10-CM

## 2015-10-27 DIAGNOSIS — D649 Anemia, unspecified: Secondary | ICD-10-CM

## 2015-10-27 NOTE — Patient Instructions (Addendum)
Continue finger splint to immoblize and  protect finger from direct impact with other objects.  Continue to apply ice as need for pain.  Ok to take acetaminophen 650 mg as needed for pain.  Return in 3 weeks for follow-up.   IF you received an x-ray today, you will receive an invoice from [facility]. Please contact [facility] at [phone] with questions or concerns regarding your invoice.   IF you received labwork today, you will receive an invoice from [facility]. Please contact [facility] at [phone] with questions or concerns regarding your invoice.   Our billing staff will not be able to assist you with questions regarding bills from these companies.  You will be contacted with the lab results as soon as they are available. The fastest way to get your results is to activate your My Chart account. Instructions are located on the last page of this paperwork. If you have not heard from us regarding the results in 2 weeks, please contact this office.

## 2015-10-27 NOTE — Progress Notes (Signed)
Patient ID: Stephanie Henson, female    DOB: [birth year], 70 y.o.   MRN: [id number]  PCP: Lupita Raider, MD  Chief Complaint  Patient presents with  . Follow-up    right index finger    Subjective:   HPI Presents for evaluation of right index finger.  Right Index Finger  70 year old female presents for follow-up index fracture with subsequent development of localized skin infection. Patient reports tenderness with movement of digit with activities such as dishwashing. She has been alternating leaving and taking the finger splint on and off.  She hasn't taken anything for pain.  Low Hemoglobin   She reports a scheduled appointment with [facility] to have her low hemoglobin  evaluated for this upcoming Thursday. She denies any new fatigue but reports drowsiness due to not being able to fall asleep for several hours after lying down. She denies dark tarry stools or hematemesis.   . Social History   Social History  . Marital status: Married    Spouse name: N/A  . Number of children: N/A  . Years of education: N/A   Occupational History  . Not on file.   Social History Main Topics  . Smoking status: Never Smoker  . Smokeless tobacco: Never Used  . Alcohol use No  . Drug use: No  . Sexual activity: Not on file   Other Topics Concern  . Not on file   Social History Narrative  . No narrative on file   Review of Systems  Musculoskeletal:       See HPI    Patient Active Problem List   Diagnosis Date Noted  . Anemia 10/12/2015  . Glaucoma 09/16/2015  . Pulmonary nodules 12/26/2012  . Chronic rhinitis 12/26/2012     Prior to Admission medications   Medication Sig Start Date End Date Taking? Authorizing Provider  ALPHAGAN P 0.1 % SOLN INSTILL 1 DROP IN BOTH EYES THREE TIMES A DAY 12/24/14  Yes Historical Provider, MD  aspirin 81 MG tablet Take 81 mg by mouth daily.   Yes Historical Provider, MD  azelastine (ASTELIN) 0.1 % nasal spray USE 2 SPRAYS  INTO EACH NOSTRIL TWICE A DAY 05/20/15  Yes Historical Provider, MD  BETIMOL 0.5 % ophthalmic solution Place 1 drop into both eyes daily. Each eye once per day 11/06/12  Yes Historical Provider, MD  Calcium Carbonate-Vitamin D (CALCIUM 500 + D PO) Take 1 tablet by mouth daily.   Yes Historical Provider, MD  Omega-3 Fatty Acids (FISH OIL) 1000 MG CAPS Take 2 capsules by mouth daily.    Yes Historical Provider, MD  cephALEXin (KEFLEX) 500 MG capsule Take 1 capsule (500 mg total) by mouth 2 (two) times daily. Patient not taking: Reported on 10/27/2015 10/12/15   Collene Gobble, MD  latanoprost (XALATAN) 0.005 % ophthalmic solution Place 1 drop into both eyes 2 (two) times daily.  12/22/12   Historical Provider, MD  LORazepam (ATIVAN) 0.5 MG tablet Take 0.5 mg by mouth 2 (two) times daily.    Historical Provider, MD     Allergies  Allergen Reactions  . Asa [Aspirin] Nausea Only    Only 325 mg dose   . Codeine Other (See Comments)    Heart flutters  . Sudafed [Pseudoephedrine Hcl]      Objective:  Physical Exam  Constitutional: She is oriented to person, place, and time. She appears well-developed and well-nourished.  HENT:  Head: Normocephalic and atraumatic.  Right Ear: External ear normal.  Left Ear: External ear normal.  Nose: Nose normal.  Mouth/Throat: Oropharynx is clear and moist.  Eyes: Conjunctivae are normal. Pupils are equal, round, and reactive to light.  Neck: Normal range of motion. Neck supple.  Cardiovascular: Normal rate, regular rhythm, normal heart sounds and intact distal pulses.   Pulmonary/Chest: Effort normal and breath sounds normal.  Musculoskeletal: Normal range of motion.  Neurological: She is alert and oriented to person, place, and time.  Skin: Skin is warm and dry.  Psychiatric: She has a normal mood and affect. Her behavior is normal. Judgment and thought content normal.  . Vitals:   10/27/15 1436  BP: 130/74  Pulse: 69  Resp: 16  Temp: 98.4 F (36.9 C)   .Dg Finger Index Right  Result Date: 10/27/2015 CLINICAL DATA:  Followup fracture. EXAM: RIGHT INDEX FINGER 2+V COMPARISON:  None. FINDINGS: Degenerative changes within the IP joints, stable. No visible fracture. Bony irregularity is increased at the PIP joint since prior study, best seen on the lateral view. Soft tissues are intact. IMPRESSION: No visible fracture, but irregularity at the DIP joint on the lateral view has increased since prior study. This is of unknown etiology. This could possibly reflect healing fracture. Degenerative changes in the IP joints of the right index finger. Electronically Signed   By: Charlett NoseKevin  Dover M.D.   On: 10/27/2015 15:21   Assessment & Plan:  1. Fracture of phalanx of right index finger, with delayed healing, subsequent encounter - DG Finger Index Right Plan: -Continue to splint and immoblize right index finger for a minimal of 6 more weeks. Repeat imaging in 6 weeks.  Follow-up in 3 weeks for follow-up   2. Low hemoglobin Plan: Appointment with Gastroenterology on Thursday, 10/29/2015.   Godfrey PickKimberly S. Tiburcio PeaHarris, MSN, FNP-C [facility]

## 2015-11-25 ENCOUNTER — Ambulatory Visit (INDEPENDENT_AMBULATORY_CARE_PROVIDER_SITE_OTHER): Payer: Medicare Other

## 2015-11-25 ENCOUNTER — Ambulatory Visit (INDEPENDENT_AMBULATORY_CARE_PROVIDER_SITE_OTHER): Payer: Medicare Other | Admitting: Family Medicine

## 2015-11-25 VITALS — BP 136/78 | HR 52 | Temp 97.9°F | Resp 17 | Ht 64.5 in | Wt 119.0 lb

## 2015-11-25 DIAGNOSIS — S62660G Nondisplaced fracture of distal phalanx of right index finger, subsequent encounter for fracture with delayed healing: Secondary | ICD-10-CM

## 2015-11-25 DIAGNOSIS — S62600A Fracture of unspecified phalanx of right index finger, initial encounter for closed fracture: Secondary | ICD-10-CM | POA: Diagnosis not present

## 2015-11-25 MED ORDER — CYCLOBENZAPRINE HCL 7.5 MG PO TABS: 7.5000 mg | ORAL_TABLET | Freq: Every day | ORAL | 0 refills | Status: DC

## 2015-11-25 MED ORDER — DICLOFENAC SODIUM 1 % TD GEL: TRANSDERMAL | 3 refills | Status: DC

## 2015-11-25 NOTE — Patient Instructions (Addendum)
Apply Voltaren gel to finger joints for pain relief.  Start cyclobenzaprine (Flexeril) 7.5 mg for neck tension.  Follow-up if pain doesn't resolve, for a referral to orthopedics.   IF you received an x-ray today, you will receive an invoice from [facility]. Please contact [facility] at [phone] with questions or concerns regarding your invoice.   IF you received labwork today, you will receive an invoice from [facility]. Please contact [facility] at [phone] with questions or concerns regarding your invoice.   Our billing staff will not be able to assist you with questions regarding bills from these companies.  You will be contacted with the lab results as soon as they are available. The fastest way to get your results is to activate your My Chart account. Instructions are located on the last page of this paperwork. If you have not heard from [place] regarding the results in 2 weeks, please contact this office.    Osteoarthritis Osteoarthritis is a disease that causes soreness and inflammation of a joint. It occurs when the cartilage at the affected joint wears down. Cartilage acts as a cushion, covering the ends of bones where they meet to form a joint. Osteoarthritis is the most common form of arthritis. It often occurs in older people. The joints affected most often by this condition include those in the:  Ends of the fingers.  Thumbs.  Neck.  Lower back.  Knees.  Hips. CAUSES  Over time, the cartilage that covers the ends of bones begins to wear away. This causes bone to rub on bone, producing pain and stiffness in the affected joints.  RISK FACTORS Certain factors can increase your chances of having osteoarthritis, including:  Older age.  Excessive body weight.  Overuse of joints.  Previous joint injury. SIGNS AND SYMPTOMS   Pain, swelling, and stiffness in the joint.  Over time, the joint may lose its normal  shape.  Small deposits of bone (osteophytes) may grow on the edges of the joint.  Bits of bone or cartilage can break off and float inside the joint space. This may cause more pain and damage. DIAGNOSIS  Your health care provider will do a physical exam and ask about your symptoms. Various tests may be ordered, such as:  X-rays of the affected joint.  Blood tests to rule out other types of arthritis. Additional tests may be used to diagnose your condition. TREATMENT  Goals of treatment are to control pain and improve joint function. Treatment plans may include:  A prescribed exercise program that allows for rest and joint relief.  A weight control plan.  Pain relief techniques, such as:  Properly applied heat and cold.  Electric pulses delivered to nerve endings under the skin (transcutaneous electrical nerve stimulation [TENS]).  Massage.  Certain nutritional supplements.  Medicines to control pain, such as:  Acetaminophen.  Nonsteroidal anti-inflammatory drugs (NSAIDs), such as naproxen.  Narcotic or central-acting agents, such as tramadol.  Corticosteroids. These can be given orally or as an injection.  Surgery to reposition the bones and relieve pain (osteotomy) or to remove loose pieces of bone and cartilage. Joint replacement may be needed in advanced states of osteoarthritis. HOME CARE INSTRUCTIONS   Take medicines only as directed by your health care provider.  Maintain a healthy weight. Follow your health care provider's instructions for weight control. This may include dietary instructions.  Exercise as directed. Your health care provider can recommend specific types of exercise. These may include:  Strengthening exercises.  These are done to strengthen the muscles that support joints affected by arthritis. They can be performed with weights or with exercise bands to add resistance.  Aerobic activities. These are exercises, such as brisk walking or  low-impact aerobics, that get your heart pumping.  Range-of-motion activities. These keep your joints limber.  Balance and agility exercises. These help you maintain daily living skills.  Rest your affected joints as directed by your health care provider.  Keep all follow-up visits as directed by your health care provider. SEEK MEDICAL CARE IF:   Your skin turns red.  You develop a rash in addition to your joint pain.  You have worsening joint pain.  You have a fever along with joint or muscle aches. SEEK IMMEDIATE MEDICAL CARE IF:  You have a significant loss of weight or appetite.  You have night sweats. FOR MORE INFORMATION   National Institute of Arthritis and Musculoskeletal and Skin Diseases: www.niams.http://www.myers.net/nih.gov  [employer] Aging: https://walker.com/www.nia.nih.gov  American College of Rheumatology: www.rheumatology.org   This information is not intended to replace advice given to you by your health care provider. Make sure you discuss any questions you have with your health care provider.   Document Released: 02/07/2005 Document Revised: 02/28/2014 Document Reviewed: 10/15/2012 Elsevier Interactive Patient Education [employer]2016 Elsevier Inc.

## 2015-11-25 NOTE — Progress Notes (Signed)
Patient ID: Stephanie Henson, female    DOB: 1945/11/09, 70 y.o.   MRN: [id number]  PCP: Lupita RaiderSHAW,KIMBERLEE, MD  Chief Complaint  Patient presents with  . Follow-up    First finger rt hand still bothering you    Subjective:   HPI 70 year old female presents for evaluation of right first finger.  She was evaluated and treated for laceration of right first finger 09/16/15.  She subsequently developed an infection of the right finger and was treated empirically with antibiotic therapy.  She kept the finger immobilized since injury and presents today with persistent pain of right index finger. She is also complaining of pain bilateral fingers and aching DIP of digits bilaterally. Denies increased warmth or redness of DIP joints.  Social History   Social History  . Marital status: Married    Spouse name: N/A  . Number of children: N/A  . Years of education: N/A   Occupational History  . Not on file.   Social History Main Topics  . Smoking status: Never Smoker  . Smokeless tobacco: Never Used  . Alcohol use No  . Drug use: No  . Sexual activity: Not on file   Other Topics Concern  . Not on file   Social History Narrative  . No narrative on file   Family History  Problem Relation Age of Onset  . Heart disease Father   . Heart disease Brother   . Lung cancer Sister     smoked   Review of Systems See HPI  Patient Active Problem List   Diagnosis Date Noted  . Anemia 10/12/2015  . Glaucoma 09/16/2015  . Pulmonary nodules 12/26/2012  . Chronic rhinitis 12/26/2012     Prior to Admission medications   Medication Sig Start Date End Date Taking? Authorizing Provider  ALPHAGAN P 0.1 % SOLN INSTILL 1 DROP IN BOTH EYES THREE TIMES A DAY 12/24/14  Yes Historical Provider, MD  aspirin 81 MG tablet Take 81 mg by mouth daily.   Yes Historical Provider, MD  azelastine (ASTELIN) 0.1 % nasal spray USE 2 SPRAYS INTO EACH NOSTRIL TWICE A DAY 05/20/15  Yes Historical Provider, MD    BETIMOL 0.5 % ophthalmic solution Place 1 drop into both eyes daily. Each eye once per day 11/06/12  Yes Historical Provider, MD  Calcium Carbonate-Vitamin D (CALCIUM 500 + D PO) Take 1 tablet by mouth daily.   Yes Historical Provider, MD  latanoprost (XALATAN) 0.005 % ophthalmic solution Place 1 drop into both eyes 2 (two) times daily.  12/22/12  Yes Historical Provider, MD  LORazepam (ATIVAN) 0.5 MG tablet Take 0.5 mg by mouth 2 (two) times daily.   Yes Historical Provider, MD  Omega-3 Fatty Acids (FISH OIL) 1000 MG CAPS Take 2 capsules by mouth daily.    Yes Historical Provider, MD     Allergies  Allergen Reactions  . Asa [Aspirin] Nausea Only    Only 325 mg dose   . Codeine Other (See Comments)    Heart flutters  . Sudafed [Pseudoephedrine Hcl]        Objective:  Physical Exam  Constitutional: She is oriented to person, place, and time. She appears well-developed and well-nourished.  HENT:  Head: Normocephalic and atraumatic.  Right Ear: External ear normal.  Left Ear: External ear normal.  Nose: Nose normal.  Mouth/Throat: Oropharynx is clear and moist.  Eyes: Conjunctivae and EOM are normal. Pupils are equal, round, and reactive to light.  Neck: Normal range of  motion. Neck supple.  Cardiovascular: Normal rate, regular rhythm, normal heart sounds and intact distal pulses.   Pulmonary/Chest: Effort normal and breath sounds normal.  Musculoskeletal: She exhibits edema, tenderness and deformity.  DIP and PIP joints of bilateral digits of both hands presence of enlarged nodules. Digit edema present. Negative for increased warmth. Right index finger wound closed and healed.  Neurological: She is alert and oriented to person, place, and time.  Skin: Skin is warm and dry.  Psychiatric: She has a normal mood and affect. Her behavior is normal. Judgment and thought content normal.   Dg Finger Index Right  Result Date: 11/25/2015 CLINICAL DATA:  Follow-up fracture of the index  finger. EXAM: RIGHT INDEX FINGER 2+V COMPARISON:  Radiographs 10/12/2015 and 10/27/2015. FINDINGS: The bones are demineralized. There are stable degenerative changes at the distal and proximal interphalangeal joints. There is a mild valgus deformity at the PIP joint. No evidence of acute fracture or dislocation. The soft tissues appear unchanged. IMPRESSION: No acute or subacute osseous findings are seen. Stable interphalangeal osteoarthritis. Electronically Signed   By: Carey Bullocks M.D.   On: 11/25/2015 10:16   Vitals:   11/25/15 0946  BP: 136/78  Pulse: (!) 52  Resp: 17  Temp: 97.9 F (36.6 C)      Assessment & Plan:  1. Open nondisplaced fracture of distal phalanx of right index finger with delayed healing, subsequent encounter - DG Finger Index Right  70 year old female, presents today for evaluation of right index finger which has subsequently healed from a closed fracture which occurred on  She presents today with continued pain DIP joint, bilateral hands.  X-ray and physical exam show the presence of osteoarthritic changes. 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 Patient reports that she doesn't like Tylenol as it causes palpitations and history of idiopathic anemia, would like to avoid antiinflammatory at this point. I will treat pain conservatively for now.   Will consider tramadol if pain is not controlled with prescribed medication.  Plan: . diclofenac sodium (VOLTAREN) 1 % GEL    Sig: Apply gel up to two times daily for relief of pain and inflammation.  . cyclobenzaprine (FEXMID) 7.5 MG tablet    Sig: Take 1 tablet (7.5 mg total) by mouth at bedtime as needed for muscle and joint pain.   Follow-up as needed or if pain doesn't resolve.  Godfrey Pick. Tiburcio Pea, MSN, FNP-C [facility]

## 2015-12-04 ENCOUNTER — Telehealth: Payer: Self-pay

## 2015-12-04 MED ORDER — CYCLOBENZAPRINE HCL 10 MG PO TABS: 10.0000 mg | ORAL_TABLET | Freq: Every day | ORAL | 0 refills | Status: DC

## 2015-12-04 MED ORDER — CYCLOBENZAPRINE HCL 10 MG PO TABS: 10.0000 mg | ORAL_TABLET | Freq: Three times a day (TID) | ORAL | 0 refills | Status: DC | PRN

## 2015-12-04 NOTE — Telephone Encounter (Signed)
PA is needed for cyclobenzaprine 7.5 (Fexmid). Called pharmacist to see if they know if this is because it is not the 10 mg (flexeril) version, or if just because they are both high risk meds in elderly. Pharm stated that he thinks it is prob due to high risk meds, and either will be hard to get covered, but if we change to 10 mg he can use the discount card and bring cash price down below $10. The 7.5 will be expensive. Kim, do you want to change to 10mg ? Pended it for review.

## 2015-12-04 NOTE — Telephone Encounter (Signed)
I have release the order for cyclobenzaprine 10 mg at bedtime only.   Thanks,  Joaquin CourtsKimberly Felicie Kocher FNP-C

## 2015-12-24 ENCOUNTER — Telehealth: Payer: Self-pay

## 2015-12-24 DIAGNOSIS — M1990 Unspecified osteoarthritis, unspecified site: Secondary | ICD-10-CM

## 2015-12-24 NOTE — Telephone Encounter (Signed)
PATIENT STATES WHEN SHE CAME IN AND SAW KIMBERLY HARRIS LAST MONTH SHE TOLD HER TO CALL HER BACK IF SHE WANTED TO GET A REFERRAL TO A RHEUMATOLOGIST FOR HER ARTHRITIS. SHE WOULD  LIKE TO HAVE THE REFERRAL MADE PLEASE. BEST PHONE [phone] (CELL)  MBC

## 2015-12-24 NOTE — Telephone Encounter (Signed)
Please place referral if appropriate.

## 2015-12-24 NOTE — Telephone Encounter (Signed)
I will place referral for her to be evaluated by Rheumatology. Thanks!  Godfrey PickKimberly S. Tiburcio PeaHarris, MSN, FNP-C [facility]

## 2015-12-28 ENCOUNTER — Other Ambulatory Visit: Payer: Self-pay | Admitting: Family Medicine

## 2015-12-28 ENCOUNTER — Telehealth: Payer: Self-pay

## 2015-12-28 MED ORDER — TRAMADOL HCL 50 MG PO TABS: 50.0000 mg | ORAL_TABLET | Freq: Three times a day (TID) | ORAL | 0 refills | Status: DC | PRN

## 2015-12-28 NOTE — Telephone Encounter (Signed)
[facility]erral [facility]

## 2015-12-28 NOTE — Progress Notes (Signed)
Reprinted prescription.

## 2015-12-28 NOTE — Telephone Encounter (Signed)
I spoke with pt to notify her that her prescription for tramadol was faxed and her referral was made to [facility]

## 2015-12-28 NOTE — Telephone Encounter (Signed)
Please contact patient back to advise on 12/24/15 a referral was sent for [facility] to [facility].  I have ordered her 50 mg of tramadol for pain.  Please fax over this prescription and advise patient to pick-up.

## 2015-12-28 NOTE — Telephone Encounter (Signed)
Patient is waitng for a call from [employer]Kim Henson.  She states that she needs a referral to a rheumatologist when possible.  She states that her arthritis has gotten worse.  Please advise as soon as possible!   [phone]

## 2016-02-01 ENCOUNTER — Other Ambulatory Visit: Payer: Self-pay | Admitting: Family Medicine

## 2016-02-01 ENCOUNTER — Ambulatory Visit
Admission: RE | Admit: 2016-02-01 | Discharge: 2016-02-01 | Disposition: A | Discharge status: Home / Self Care | Payer: Medicare Other | Source: Ambulatory Visit | Attending: Family Medicine | Admitting: Family Medicine

## 2016-02-01 DIAGNOSIS — M542 Cervicalgia: Secondary | ICD-10-CM

## 2016-02-24 NOTE — Progress Notes (Signed)
Office Visit Note  Patient: Stephanie Henson             Date of Birth: Jul 25, 1945           MRN: [id number]             PCP: Lupita Raider, MD Referring: Daria Pastures* Visit Date: 02/25/2016 Occupation: Retired    Subjective:  Pain hands, neck and back.   History of Present Illness: Stephanie Henson is a 71 y.o. female with history of osteoarthritis. According to patient she started developing changes in her hands about 10 years ago and she has had progressive arthritis in her hands. She's also had neck discomfort for the last 20 years. She was initially seeing a chiropractor in 2004 she underwent C-spine fusion by Dr. Otelia Sergeant. She continues to have discomfort in her neck and the pain radiates to her both shoulders. She's also had left shoulder joint surgery for rotator cuff in 2005 by Dr. Ranell Patrick. Her left shoulder occasionally hurts her. She has had lower back pain for multiple years she had discectomy on her lumbar spine in 1980. She describes constant pain in her neck which goes down into her thoracic and lumbar region. She also complains of discomfort in her feet file she walks. She had a bone density 2 years ago she does not recall the results which she had an upcoming of bone density in January. She does have generalized muscle pain and wonders if she has fibromyalgia. She has tried Cymbalta in the past but could not tolerate the side effects of Cymbalta.  Activities of Daily Living:  Patient reports morning stiffness for 60 minutes.   Patient Reports nocturnal pain.  Difficulty dressing/grooming: Denies Difficulty climbing stairs: Denies Difficulty getting out of chair: Denies Difficulty using hands for taps, buttons, cutlery, and/or writing: Reports   Review of Systems  Constitutional: Positive for fatigue and weakness. Negative for night sweats, weight gain and weight loss.  HENT: Positive for mouth dryness. Negative for mouth sores, trouble swallowing, trouble  swallowing and nose dryness.   Eyes: Positive for dryness. Negative for pain, redness and visual disturbance.  Respiratory: Negative for cough, shortness of breath and difficulty breathing.   Cardiovascular: Negative for chest pain, palpitations, hypertension, irregular heartbeat and swelling in legs/feet.  Gastrointestinal: Negative for blood in stool, constipation and diarrhea.  Endocrine: Negative for increased urination.  Genitourinary: Negative for vaginal dryness.  Musculoskeletal: Positive for arthralgias, joint pain, joint swelling, myalgias, morning stiffness and myalgias. Negative for muscle weakness and muscle tenderness.  Skin: Negative for color change, rash, hair loss, skin tightness, ulcers and sensitivity to sunlight.  Allergic/Immunologic: Negative for susceptible to infections.  Neurological: Negative for dizziness, memory loss and night sweats.  Hematological: Negative for swollen glands.  Psychiatric/Behavioral: Positive for depressed mood and sleep disturbance. The patient is nervous/anxious.     PMFS History:  Patient Active Problem List   Diagnosis Date Noted  . Anemia 10/12/2015  . Glaucoma 09/16/2015  . Pulmonary nodules 12/26/2012  . Chronic rhinitis 12/26/2012    Past Medical History:  Diagnosis Date  . Anxiety   . Cervicalgia   . CKD (chronic kidney disease) stage 3, GFR 30-59 ml/min   . Diabetes mellitus (HCC)   . Environmental allergies   . FH: colonic polyps   . Fibromyalgia   . GERD (gastroesophageal reflux disease)   . Goiter   . Hematuria   . Hyperlipidemia   . Hypertension   . Osteoporosis   .  Solitary pulmonary nodule     ;RUL - 7mm on CT 04/2014; CT 10/2014 resolution of nodule on RUL however persistant smaller nodules throughouts the lungs  . Weight loss     Family History  Problem Relation Age of Onset  . Heart disease Father   . Heart disease Brother   . Lung cancer Sister     smoked   Past Surgical History:  Procedure Laterality  Date  . BACK SURGERY    . NASAL SINUS SURGERY    . NECK SURGERY     Social History   Social History Narrative  . No narrative on file     Objective: Vital Signs: BP 110/72   Pulse 66   Resp 14   Ht 5\' 4"  (1.626 m)   Wt 122 lb (55.3 kg)   BMI 20.94 kg/m    Physical Exam  Constitutional: She is oriented to person, place, and time. She appears well-developed and well-nourished.  HENT:  Head: Normocephalic and atraumatic.  Eyes: Conjunctivae and EOM are normal.  Neck: Normal range of motion.  Cardiovascular: Normal rate, regular rhythm, normal heart sounds and intact distal pulses.   Pulmonary/Chest: Effort normal and breath sounds normal.  Abdominal: Soft. Bowel sounds are normal.  Lymphadenopathy:    She has no cervical adenopathy.  Neurological: She is alert and oriented to person, place, and time.  Skin: Skin is warm and dry. Capillary refill takes less than 2 seconds.  Psychiatric: She has a normal mood and affect. Her behavior is normal.  Nursing note and vitals reviewed.    Musculoskeletal Exam: C-spine some limitation with range of motion she has painful range of motion of her thoracic spine and lumbar spine with limitation. She is good range of motion of her shoulder joints, elbow joints, wrist joints, MCPs. She has thickening of her PIP/DIP joints of her hands bilaterally with no synovitis. Hip joints knee joints ankle joints are good range of motion she has some prominence of her bilateral first MTP joint DIP PIP joints in her feet with no synovitis. Fibromyalgia tender points with 12 out of 18 positive.  CDAI Exam: No CDAI exam completed.    Investigation: Findings:  January 2017 CMP normal, August 2017 CBC hemoglobin 10.2, iron saturation 4%, uric acid 5.3    Imaging: Dg Cervical Spine Complete  Result Date: 02/01/2016 CLINICAL DATA:  Chronic neck pain, worsening on the left EXAM: CERVICAL SPINE - COMPLETE 4+ VIEW COMPARISON:  None. FINDINGS: Anterior  cervical fusion noted at C5-C7. Solid bony fusion across the disc spaces. No hardware bony complicating feature. Normal alignment. Disc spaces are maintained. Prevertebral soft tissues are normal. IMPRESSION: Prior anterior fusion C5-C7.  No acute findings. Electronically Signed   By: Charlett NoseKevin  Dover M.D.   On: 02/01/2016 10:02   Xr Foot 2 Views Left  Result Date: 02/25/2016 PIP/DIP and left first MTP narrowing. No erosive changes noted. A small calcaneal spur noted. Impression: Findings are consistent with osteoarthritis.  Xr Foot 2 Views Right  Result Date: 02/25/2016 PIP/DIP and left first MTP narrowing with mild valgus deformity. No erosive changes noted. Impression: Findings are consistent with osteoarthritis.  Xr Hand 2 View Left  Result Date: 02/25/2016 All PIP/DIP show severe narrowing. No MCP changes or erosive changes were noted. Mild CMC narrowing was noted. Impression: Findings are consistent with osteoarthritis.  Xr Hand 2 View Right  Result Date: 02/25/2016 All PIP/DIP narrowing with subluxation. No erosive changes. No MCP changes. Impression: These findings are consistent  with osteoarthritis.   Speciality Comments: No specialty comments available.    Procedures:  No procedures performed Allergies: Asa [aspirin]; Codeine; Sudafed [pseudoephedrine hcl]; and Tylenol [acetaminophen]   Assessment / Plan:     Visit Diagnoses: Primary osteoarthritis of both hands -she has clinical findings consistent with osteoarthritis with bilateral PIP/DIP thickening and no synovitis Plan: XR Hand 2 View Right, XR Hand 2 View Left. X-rays are consistent with osteoarthritis only.  Pain in both feet -patient complains of bilateral feet discomfort. Plan: XR Foot 2 Views Right, XR Foot 2 Views Left. X-rays are consistent with osteoarthritis. Proper fitting shoes were discussed.  DDD C-spine - Status post fusion 2004 Dr. Otelia Sergeant -she continues to have C-spine discomfort and trapezius muscle spasm. I  reviewed her C-spine x-rays which show mild is spondylosis and C5-C7 fusion. 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 She continues to have some lower back pain.  Pain in thoracic spine - Plan: XR Thoracic Spine 2 View, x-rays revealed multilevel spondylosis. Ambulatory referral to Physical Therapy  Chronic left shoulder pain - History of left rotator cuff tear repair by Dr. Ranell Patrick  Fibromyalgia -she complains of generalized pain myalgias and positive tender points today on examination. She states she has tried Cymbalta in the past and did not like the side effects. Plan: Ambulatory referral to Physical Therapy. She is already on Flexeril and Ultram. I do not have suggestions to add any other medications. Need for regular exercise was emphasized.  Iron deficiency anemia, unspecified iron deficiency anemia type  Type 2 diabetes mellitus without complication, without long-term current use of insulin (HCC) . Dietary controlled   Orders: Orders Placed This Encounter  Procedures  . XR Hand 2 View Right  . XR Hand 2 View Left  . XR Foot 2 Views Right  . XR Foot 2 Views Left  . XR Thoracic Spine 2 View  . Ambulatory referral to Physical Therapy   No orders of the defined types were placed in this encounter.   Face-to-face time spent with patient was 50 minutes. 50% of time was spent in counseling and coordination of care.  Follow-Up Instructions: Return in about 2 months (around 04/24/2016) for Osteoarthritis, fibromyalgia.   Pollyann Savoy, MD

## 2016-02-25 ENCOUNTER — Ambulatory Visit (INDEPENDENT_AMBULATORY_CARE_PROVIDER_SITE_OTHER): Payer: Medicare Other | Admitting: Rheumatology

## 2016-02-25 ENCOUNTER — Ambulatory Visit: Payer: Medicare Other | Admitting: Rheumatology

## 2016-02-25 ENCOUNTER — Ambulatory Visit (INDEPENDENT_AMBULATORY_CARE_PROVIDER_SITE_OTHER): Payer: Medicare Other

## 2016-02-25 ENCOUNTER — Encounter: Payer: Self-pay | Admitting: Rheumatology

## 2016-02-25 VITALS — BP 110/72 | HR 66 | Resp 14 | Ht 64.0 in | Wt 122.0 lb

## 2016-02-25 DIAGNOSIS — M19041 Primary osteoarthritis, right hand: Secondary | ICD-10-CM

## 2016-02-25 DIAGNOSIS — M79672 Pain in left foot: Secondary | ICD-10-CM | POA: Diagnosis not present

## 2016-02-25 DIAGNOSIS — E119 Type 2 diabetes mellitus without complications: Secondary | ICD-10-CM

## 2016-02-25 DIAGNOSIS — M546 Pain in thoracic spine: Secondary | ICD-10-CM | POA: Diagnosis not present

## 2016-02-25 DIAGNOSIS — M79671 Pain in right foot: Secondary | ICD-10-CM

## 2016-02-25 DIAGNOSIS — M19042 Primary osteoarthritis, left hand: Secondary | ICD-10-CM

## 2016-02-25 DIAGNOSIS — M5030 Other cervical disc degeneration, unspecified cervical region: Secondary | ICD-10-CM | POA: Diagnosis not present

## 2016-02-25 DIAGNOSIS — G8929 Other chronic pain: Secondary | ICD-10-CM

## 2016-02-25 DIAGNOSIS — D509 Iron deficiency anemia, unspecified: Secondary | ICD-10-CM | POA: Diagnosis not present

## 2016-02-25 DIAGNOSIS — M797 Fibromyalgia: Secondary | ICD-10-CM

## 2016-02-25 DIAGNOSIS — M47812 Spondylosis without myelopathy or radiculopathy, cervical region: Secondary | ICD-10-CM

## 2016-02-25 DIAGNOSIS — M47816 Spondylosis without myelopathy or radiculopathy, lumbar region: Secondary | ICD-10-CM | POA: Diagnosis not present

## 2016-02-25 DIAGNOSIS — M25512 Pain in left shoulder: Secondary | ICD-10-CM | POA: Diagnosis not present

## 2016-02-29 ENCOUNTER — Encounter: Payer: Self-pay | Admitting: Podiatry

## 2016-02-29 ENCOUNTER — Ambulatory Visit (INDEPENDENT_AMBULATORY_CARE_PROVIDER_SITE_OTHER): Payer: Medicare Other | Admitting: Podiatry

## 2016-02-29 DIAGNOSIS — M7742 Metatarsalgia, left foot: Secondary | ICD-10-CM

## 2016-02-29 DIAGNOSIS — L603 Nail dystrophy: Secondary | ICD-10-CM | POA: Diagnosis not present

## 2016-02-29 DIAGNOSIS — M722 Plantar fascial fibromatosis: Secondary | ICD-10-CM

## 2016-02-29 DIAGNOSIS — M7741 Metatarsalgia, right foot: Secondary | ICD-10-CM | POA: Diagnosis not present

## 2016-03-01 ENCOUNTER — Telehealth: Payer: Self-pay | Admitting: Rheumatology

## 2016-03-01 NOTE — Telephone Encounter (Signed)
Patient called on 02/26/16 and asked that her follow up with Dr. Corliss Skainseveshwar be canceled because she would not be back to see Dr. Corliss Skainseveshwar (as noted in cancellation reasons and I spoke to patient that day). Patient then called today stating she was upset when she made that phone call and she is requesting Dr. Corliss Skainseveshwar to prescribe her something for osteoarthritis. I advised patient we would need to r/s the follow up that she cancelled, since she is requesting a prescription. She said there was no need to r/s the follow up, she just wanted a prescription. I advised patient the follow up is part of Dr. Fatima Sangereveshwar's and the offices procedures and the patient said I was intentionally trying to upset her by asking her to r/s. I told patient that was not the case, I was just following the doctors guidelines and requirements. Patient then demanded that I ask about the prescription and I advised her that I would do so.

## 2016-03-01 NOTE — Progress Notes (Signed)
Subjective: 71 year old female presents to the office today for concerns of pain underneath the ball of her foot and her right side. This has been ongoing for a couple months at this time. She denies any numbness tingling to the toes. She also states that she gets intermittent heel pain she is previous which it for plantar fascitisi recurring at times. She is coming I expect any heel pain. She's had no recent treatment for this and she is requesting orthotics today. She is also missed a right third toenails become thick and starting to turn colors. No recent treatment for this. Denies any systemic complaints such as fevers, chills, nausea, vomiting. No acute changes since last appointment, and no other complaints at this time.   Objective: AAO x3, NAD DP/PT pulses palpable bilaterally, CRT less than 3 seconds Tenderness submetatarsal area on the right foot. There is no pain with MPJ range of motion. There is no area of tenderness of the dorsal aspect of the metatarsals. There is prominence the metatarsal heads plantarly. There is minimal tenderness palpation along the plantar medial tubercle of the calcaneus at insertion the plantar fascia. No other areas of tenderness bilaterally. The nails on the right footappear to be dystrophic, discolored nails 1-3 on the right foot. No signs of infection. No open lesions or pre-ulcerative lesions.  No pain with calf compression, swelling, warmth, erythema  Assessment: Metatarsalgia, plantar fasciitis; onychodystrophy  Plan: -All treatment options discussed with the patient including all alternatives, risks, complications.  -Previous x-rays were reviewed. -Metatarsal pads were dispensed. I discussed stretching exercises as well. She presents today requesting orthotics. She was [facility] -Nail was debrided with right third toe without complications or bleeding. -Also discussed shoe gear changes. -Follow-up in 3 weeks  to pick up inserts or sooner if needed. -Patient encouraged to call the office with any questions, concerns, change in symptoms.   Ovid CurdMatthew Geoge Lawrance, DPM

## 2016-03-02 NOTE — Telephone Encounter (Signed)
Patient just called to follow up on the rx she asked for yesterday.

## 2016-03-02 NOTE — Telephone Encounter (Signed)
I called patient, patient wanted pain medication. I informed patient that Dr. Corliss Skainseveshwar does not prescribe narcotic pain medications to new patients. Patient cancelled follow up appt when she checked out from her new patient appt. I advised patient to reschedule her new patient follow up appt to discuss her plan of care. Patient was referred to PT, PT left patient a message that patient stated she did not receive. I gave patient phone # to PT to schedule appt. Patient said she would call back if she decided to make a new patient follow up appt.

## 2016-03-03 ENCOUNTER — Ambulatory Visit: Payer: Medicare Other | Attending: Rheumatology | Admitting: Rehabilitative and Restorative Service Providers"

## 2016-03-03 DIAGNOSIS — M542 Cervicalgia: Secondary | ICD-10-CM

## 2016-03-03 DIAGNOSIS — R293 Abnormal posture: Secondary | ICD-10-CM | POA: Diagnosis present

## 2016-03-03 DIAGNOSIS — M6281 Muscle weakness (generalized): Secondary | ICD-10-CM | POA: Insufficient documentation

## 2016-03-03 NOTE — Patient Instructions (Signed)
Thoracic Self-Mobilization (Supine)    No towel roll to begin.  Lie with arms outstretched to the side and hold for 1 minute.  Rest.  Repeat for a total of 3 times. 2 times/day. http://orth.exer.us/1001   Copyright  VHI. All rights reserved.   Shoulder Shrug    Bring shoulders up toward ears. Hold __3__ seconds. Relax. Repeat __5-10__ times. Do _2___ sessions per day.  http://gt2.exer.us/13   Copyright  VHI. All rights reserved.

## 2016-03-04 NOTE — Therapy (Signed)
[facility] [address] [place], [place], [address] Phone: [phone]   Fax:  [phone]  Physical Therapy Evaluation  Patient Details  Name: Stephanie Henson MRN: [id number] Date of Birth: May 01, 1945 Referring Provider: Jeananne RamaShaili Devashwar, DO  Encounter Date: 03/03/2016      PT End of Session - 03/03/16 0845    Visit Number 1   Number of Visits 8   Date for PT Re-Evaluation 04/03/16   Authorization Type G code every 10th visit   PT Start Time 0937   PT Stop Time 1020   PT Time Calculation (min) 43 min   Activity Tolerance Patient tolerated treatment well;Patient limited by pain   Behavior During Therapy [facility] for tasks assessed/performed      Past Medical History:  Diagnosis Date  . Anxiety   . Cervicalgia   . CKD (chronic kidney disease) stage 3, GFR 30-59 ml/min   . Diabetes mellitus (HCC)   . Environmental allergies   . FH: colonic polyps   . Fibromyalgia   . GERD (gastroesophageal reflux disease)   . Goiter   . Hematuria   . Hyperlipidemia   . Hypertension   . Osteoporosis   . Solitary pulmonary nodule     ;RUL - 7mm on CT 04/2014; CT 10/2014 resolution of nodule on RUL however persistant smaller nodules throughouts the lungs  . Weight loss     Past Surgical History:  Procedure Laterality Date  . BACK SURGERY    . NASAL SINUS SURGERY    . NECK SURGERY      There were no vitals filed for this visit.       Subjective Assessment - 03/03/16 0944    Subjective The patient notes increasing pain in neck and upper back greater than 1 month.  She has h/o chronic neck pain and muscle aches due to fibromyalgia, however notes this has worsened recently.     Pertinent History Fibromyalgia, h/o back surgery 1980, neck surgery 2004, shoulder surgery L side 2005   Patient Stated Goals Learn to manage the pain. "I really don't see how therapy can help."   Currently in Pain? Yes   Pain Score 8    Pain  Location Neck  and thoracic spine 7/10   Pain Orientation Right;Left;Mid   Pain Descriptors / Indicators Aching;Constant   Pain Type Chronic pain   Pain Onset More than a month ago   Pain Frequency Constant   Aggravating Factors  worse later in the day when tired   Pain Relieving Factors pain patches help, but it doesn't take it all away   Multiple Pain Sites Yes   Pain Score 6   Pain Location Foot  in toes and under ball of foot   Pain Orientation Right;Left  right foot worse, left foot 4/10   Pain Descriptors / Indicators Burning   Pain Type Chronic pain  "a long time"   Pain Onset More than a month ago   Pain Frequency Constant   Aggravating Factors  walking   Pain Relieving Factors unsure            [facility] PT Assessment - 03/03/16 0951      Assessment   Medical Diagnosis DJD   Referring Provider Jeananne RamaShaili Devashwar, DO   Onset Date/Surgical Date --  worse over past couple of months   Hand Dominance Right   Prior Therapy none     Precautions   Precautions --  pain from fibromyalgia and OA  Restrictions   Weight Bearing Restrictions No     Balance Screen   Has the patient fallen in the past 6 months No   Has the patient had a decrease in activity level because of a fear of falling?  No  due to pain, not as active   Is the patient reluctant to leave their home because of a fear of falling?  No     Home Environment   Living Environment Private residence   Living Arrangements Spouse/significant other   Type of Home House   Home Access Stairs to enter   Entrance Stairs-Number of Steps 5   Home Layout One level   Additional Comments limited to a couple hours of getting out of the house/ goes home to go to bed     Prior Function   Level of Independence Independent   Leisure "stays tired all of the time"     Cognition   Overall Cognitive Status Within Functional Limits for tasks assessed     Observation/Other Assessments   Focus on Therapeutic Outcomes  (FOTO)  53%     Posture/Postural Control   Posture/Postural Control Postural limitations   Postural Limitations Rounded Shoulders;Increased thoracic kyphosis;Decreased lumbar lordosis   Posture Comments guarded cervical position, ears are over shoulders     ROM / Strength   AROM / PROM / Strength AROM;Strength     AROM   AROM Assessment Site Cervical;Shoulder   Right/Left Shoulder Right;Left   Right Shoulder Flexion 130 Degrees   Left Shoulder Flexion 140 Degrees  limited by pain in neck/shoulders with reaching overhead   Cervical Flexion 28 deg   Cervical Extension 16 deg   Cervical - Right Side Bend 20 deg   Cervical - Left Side Bend 18 deg   Cervical - Right Rotation 42 deg   Cervical - Left Rotation 40 deg     Strength   Overall Strength Deficits   Strength Assessment Site Shoulder;Elbow   Right/Left Shoulder Right;Left   Right Shoulder Flexion 4/5   Right Shoulder ABduction 4/5   Left Shoulder Flexion 4/5   Left Shoulder ABduction 4/5   Right/Left Elbow Right;Left   Right Elbow Flexion 5/5   Right Elbow Extension 5/5   Left Elbow Flexion 5/5   Left Elbow Extension 5/5     Flexibility   Soft Tissue Assessment /Muscle Length yes     Palpation   Palpation comment Painful and tender to superficial palpation of bilateral upper trap, scalenes, suboccipitals, parascapular muscles.                    [facility] Adult PT Treatment/Exercise - 03/04/16 0001      Self-Care   Self-Care Other Self-Care Comments     Exercises   Exercises --                PT Education - 03/03/16 1014    Education provided Yes   Education Details HEP: anterior chest stretch supine and shoulder shrugs   Person(s) Educated Patient   Methods Explanation;Demonstration;Handout   Comprehension Verbalized understanding;Returned demonstration          PT Short Term Goals - 03/04/16 0846      PT SHORT TERM GOAL #1   Title STGs=LTGs           PT Long Term Goals -  03/04/16 0846      PT LONG TERM GOAL #1   Title The patient will be independent with HEP for ROM,  flexibility, strengthening and postural stability.   Baseline Target date 04/03/2016   Time 4   Period Weeks     PT LONG TERM GOAL #2   Title The patient will improve functional status survey score from 53% to > or equal to 65% to demonstrate improved overall perception of mobility.   Baseline Target date 04/03/2016   Time 4   Period Weeks     PT LONG TERM GOAL #3   Title The patient will improve cervical rotation up to 58 deg bilaterally   Baseline Target date 04/03/2016   Time 4   Period Weeks     PT LONG TERM GOAL #4   Title The patient will improve bilateral UE strength from 4/5 shoulder flexion/abduction up to 5/5 for improved functional strength.   Baseline Target date 04/03/2016   Time 4   Period Weeks     PT LONG TERM GOAL #5   Title The patient will verbalize understanding of strategies for self mgmt of chronic pain.   Baseline Target date 04/03/2016   Time 4   Period Weeks               Plan - 03/04/16 1119    Clinical Impression Statement The patient is a 71 yo female presenting with chronic neck and upper back pain.   She presents with reduced ROM, diminished strength and overall decline in tolerance to ADLs and IADLs due to debilitating pain.  PT discussed realistic outcomes with therapy as patient expressed doubt of any benefit from intervention. Our focus/emphasis will be on self mgmt of chronic symptoms, postural strengthening, and improving tolerance to daily activities.  Patient agrees to participate.    Rehab Potential Good   PT Frequency 2x / week   PT Duration 4 weeks   PT Treatment/Interventions ADLs/Self Care Home Management;Therapeutic activities;Therapeutic exercise;Neuromuscular re-education;Patient/family education;Cryotherapy;Moist Heat;Manual techniques   PT Next Visit Plan Check HEP: add supine cervical isometric/chin tuck, add towel roll to current  chest stretch, cervical AROM   Consulted and Agree with Plan of Care Patient      Patient will benefit from skilled therapeutic intervention in order to improve the following deficits and impairments:  Decreased range of motion, Pain, Impaired UE functional use, Decreased activity tolerance, Impaired flexibility, Postural dysfunction, Decreased strength  Visit Diagnosis: Neck pain  Muscle weakness (generalized)  Abnormal posture     Problem List Patient Active Problem List   Diagnosis Date Noted  . Anemia 10/12/2015  . Glaucoma 09/16/2015  . Pulmonary nodules 12/26/2012  . Chronic rhinitis 12/26/2012    Nole Robey, PT 03/04/2016, 11:26 AM  [facility] [address] [place], [place], [address] Phone: [phone]   Fax:  [phone]  Name: Stephanie Henson MRN: [id number] Date of Birth: May 12, 1945

## 2016-03-08 ENCOUNTER — Encounter: Payer: Self-pay | Admitting: Physical Therapy

## 2016-03-08 ENCOUNTER — Ambulatory Visit: Payer: Medicare Other | Admitting: Physical Therapy

## 2016-03-08 DIAGNOSIS — M542 Cervicalgia: Secondary | ICD-10-CM | POA: Diagnosis not present

## 2016-03-08 DIAGNOSIS — R293 Abnormal posture: Secondary | ICD-10-CM

## 2016-03-08 DIAGNOSIS — M6281 Muscle weakness (generalized): Secondary | ICD-10-CM

## 2016-03-08 NOTE — Patient Instructions (Addendum)
   While lying on your back with a towel under your head: press your head down into the mat. You should feel the muscles on the back of your neck get tight. Hold for 5 seconds. Perform 10 reps.   Scapular Retraction (Standing)    Either lying on back, seated or standing: With arms at sides, pinch shoulder blades together. Hold for 5 seconds.  Repeat _10_ times per set. Do _1-2__ sessions per day.  http://orth.exer.us/945   Copyright  VHI. All rights reserved.    Upper Limb Neural Tension: Radial I    In seated position: Place right arm across low back and turn head down toward other side. IF No stretch is felt: Gently increase stretch by pulling down on head and depressing shoulder girdle. Hold for 20 seconds.  Repeat _3_ times each side. Do __1-2__ sessions per day.  http://orth.exer.us/408   Copyright  VHI. All rights reserved.

## 2016-03-08 NOTE — Therapy (Signed)
[facility] [address] [place], [place], [address] Phone: [phone]   Fax:  [phone]  Physical Therapy Treatment  Patient Details  Name: Stephanie BaileyClara C Canelo MRN: [id number] Date of Birth: [birth year] Referring Provider: Jeananne RamaShaili Devashwar, DO  Encounter Date: 03/08/2016      PT End of Session - 03/08/16 1024    Visit Number 2   Number of Visits 8   Date for PT Re-Evaluation 04/03/16   Authorization Type G code every 10th visit   PT Start Time 1018   PT Stop Time 1100   PT Time Calculation (min) 42 min   Activity Tolerance Patient tolerated treatment well;Patient limited by pain   Behavior [facility] for tasks assessed/performed      Past Medical History:  Diagnosis Date  . Anxiety   . Cervicalgia   . CKD (chronic kidney disease) stage 3, GFR 30-59 ml/min   . Diabetes mellitus (HCC)   . Environmental allergies   . FH: colonic polyps   . Fibromyalgia   . GERD (gastroesophageal reflux disease)   . Goiter   . Hematuria   . Hyperlipidemia   . Hypertension   . Osteoporosis   . Solitary pulmonary nodule     ;RUL - 7mm on CT 04/2014; CT 10/2014 resolution of nodule on RUL however persistant smaller nodules throughouts the lungs  . Weight loss     Past Surgical History:  Procedure Laterality Date  . BACK SURGERY    . NASAL SINUS SURGERY    . NECK SURGERY      There were no vitals filed for this visit.      Subjective Assessment - 03/08/16 1023    Subjective No new complaints. No falls.    Pertinent History Fibromyalgia, h/o back surgery 1980, neck surgery 2004, shoulder surgery L side 2005   Patient Stated Goals Learn to manage the pain. "I really don't see how therapy can help."   Currently in Pain? Yes   Pain Score 8    Pain Location Neck   Pain Descriptors / Indicators Aching;Constant   Pain Type Chronic pain   Pain Radiating Towards into thoracic back and bil UE's   Pain Onset More than  a month ago   Pain Frequency Constant   Aggravating Factors  worse later in day as she get tired; cold weather   Pain Relieving Factors pain patches;         03/08/16 1100  Neck Exercises: Supine  Neck Retraction 10 reps;Limitations;5 secs  Neck Retraction Limitations cues on hold time and ex form/technique  Manual Therapy  Manual Therapy Soft tissue mobilization;Myofascial release;Neural Stretch;Manual Traction  Manual therapy comments for decreased pain and mucle tightness  Soft tissue mobilization to bil cervical paraspinals, upper traps, STM and scalenes  Myofascial Release to bil upper traps and rhomboids  Manual Traction gentle cervical distraction for decreased pain/tightness  Neural Stretch passive stretching of bil upper traps and scalenes.   Neck Exercises: Stretches  Upper Trapezius Stretch 2 reps;20 seconds;Limitations   Reviewed exercises from last session with min cues on ex form/technique.  Issued the following today to pt's HEP:   While lying on your back with a towel under your head: press your head down into the mat. You should feel the muscles on the back of your neck get tight. Hold for 5 seconds. Perform 10 reps.   Scapular Retraction (Standing)    Either lying on back, seated or standing: With arms at  sides, pinch shoulder blades together. Hold for 5 seconds.  Repeat _10_ times per set. Do _1-2__ sessions per day.  http://orth.exer.us/945   Copyright  VHI. All rights reserved.    Upper Limb Neural Tension: Radial I    In seated position: Place right arm across low back and turn head down toward other side. IF No stretch is felt: Gently increase stretch by pulling down on head and depressing shoulder girdle. Hold for 20 seconds.  Repeat _3_ times each side. Do __1-2__ sessions per day.  http://orth.exer.us/408   Copyright  VHI. All rights reserved.          PT Education - 03/08/16 1057    Education provided Yes   Education Details HEP:  added chin tucks, scapular retraction and upper trap stretches   Person(s) Educated Patient   Methods Explanation;Demonstration;Verbal cues;Handout   Comprehension Verbalized understanding;Returned demonstration;Verbal cues required;Need further instruction          PT Short Term Goals - 03/04/16 0846      PT SHORT TERM GOAL #1   Title STGs=LTGs           PT Long Term Goals - 03/04/16 0846      PT LONG TERM GOAL #1   Title The patient will be independent with HEP for ROM, flexibility, strengthening and postural stability.   Baseline Target date 04/03/2016   Time 4   Period Weeks     PT LONG TERM GOAL #2   Title The patient will improve functional status survey score from 53% to > or equal to 65% to demonstrate improved overall perception of mobility.   Baseline Target date 04/03/2016   Time 4   Period Weeks     PT LONG TERM GOAL #3   Title The patient will improve cervical rotation up to 58 deg bilaterally   Baseline Target date 04/03/2016   Time 4   Period Weeks     PT LONG TERM GOAL #4   Title The patient will improve bilateral UE strength from 4/5 shoulder flexion/abduction up to 5/5 for improved functional strength.   Baseline Target date 04/03/2016   Time 4   Period Weeks     PT LONG TERM GOAL #5   Title The patient will verbalize understanding of strategies for self mgmt of chronic pain.   Baseline Target date 04/03/2016   Time 4   Period Weeks           Plan - 03/08/16 1025    Clinical Impression Statement today's session continued to work towards pain reduction and cervical/postural strengthening. Added new exercises/stretches to HEP without any issues reported in session. Pt is making progress toward goals and should benefit from continued PT to progress toward unmet goals.    Rehab Potential Good   PT Frequency 2x / week   PT Duration 4 weeks   PT Treatment/Interventions ADLs/Self Care Home Management;Therapeutic activities;Therapeutic  exercise;Neuromuscular re-education;Patient/family education;Cryotherapy;Moist Heat;Manual techniques   PT Next Visit Plan continued to work on decreased pain and strengthening, add towel roll to current chest stretch when pt able to tolerate, cervical AROM   Consulted and Agree with Plan of Care Patient      Patient will benefit from skilled therapeutic intervention in order to improve the following deficits and impairments:  Decreased range of motion, Pain, Impaired UE functional use, Decreased activity tolerance, Impaired flexibility, Postural dysfunction, Decreased strength  Visit Diagnosis: Neck pain  Muscle weakness (generalized)  Abnormal posture     Problem  List Patient Active Problem List   Diagnosis Date Noted  . Anemia 10/12/2015  . Glaucoma 09/16/2015  . Pulmonary nodules 12/26/2012  . Chronic rhinitis 12/26/2012    Sallyanne Kuster, PTA, [facility] [address] [place], [place] [address] [phone] 03/08/16, 1:04 PM   Name: TAYNA SMETHURST MRN: [id number] Date of Birth: 20-Jul-1945

## 2016-03-10 ENCOUNTER — Ambulatory Visit: Payer: Medicare Other | Admitting: Rehabilitative and Restorative Service Providers"

## 2016-03-16 ENCOUNTER — Ambulatory Visit: Payer: Medicare Other | Admitting: Rehabilitative and Restorative Service Providers"

## 2016-03-17 ENCOUNTER — Ambulatory Visit: Payer: Medicare Other | Admitting: Physical Therapy

## 2016-03-17 ENCOUNTER — Encounter: Payer: Self-pay | Admitting: Physical Therapy

## 2016-03-17 DIAGNOSIS — M542 Cervicalgia: Secondary | ICD-10-CM | POA: Diagnosis not present

## 2016-03-17 DIAGNOSIS — M6281 Muscle weakness (generalized): Secondary | ICD-10-CM

## 2016-03-17 DIAGNOSIS — R293 Abnormal posture: Secondary | ICD-10-CM

## 2016-03-17 NOTE — Therapy (Signed)
[facility] [address] [place], [place], [address] Phone: [phone]   Fax:  [phone]  Physical Therapy Treatment  Patient Details  Name: Stephanie Henson MRN: [id number] Date of Birth: [birth year] Referring Provider: Jeananne Rama, DO  Encounter Date: 03/17/2016      PT End of Session - 03/17/16 0852    Visit Number 3   Number of Visits 8   Date for PT Re-Evaluation 04/03/16   Authorization Type G code every 10th visit   PT Start Time 0847   PT Stop Time 0930   PT Time Calculation (min) 43 min   Activity Tolerance Patient tolerated treatment well;Patient limited by pain   Behavior During Therapy [facility] for tasks assessed/performed      Past Medical History:  Diagnosis Date  . Anxiety   . Cervicalgia   . CKD (chronic kidney disease) stage 3, GFR 30-59 ml/min   . Diabetes mellitus (HCC)   . Environmental allergies   . FH: colonic polyps   . Fibromyalgia   . GERD (gastroesophageal reflux disease)   . Goiter   . Hematuria   . Hyperlipidemia   . Hypertension   . Osteoporosis   . Solitary pulmonary nodule     ;RUL - 7mm on CT 04/2014; CT 10/2014 resolution of nodule on RUL however persistant smaller nodules throughouts the lungs  . Weight loss     Past Surgical History:  Procedure Laterality Date  . BACK SURGERY    . NASAL SINUS SURGERY    . NECK SURGERY      There were no vitals filed for this visit.      Subjective Assessment - 03/17/16 0851    Subjective No new complaints. No falls to report. Reports pain is a little better.    Pertinent History Fibromyalgia, h/o back surgery 1980, neck surgery 2004, shoulder surgery L side 2005   Patient Stated Goals Learn to manage the pain. "I really don't see how therapy can help."   Currently in Pain? Yes   Pain Score 4    Pain Location Neck   Pain Orientation Right;Left;Mid   Pain Descriptors / Indicators Aching;Sore   Pain Type Chronic pain   Pain  Onset More than a month ago   Pain Frequency Constant   Aggravating Factors  worse later in day with fatigue, cold weather   Pain Relieving Factors pain patches, heat            [facility] Adult PT Treatment/Exercise - 03/17/16 0905      Neck Exercises: Theraband   Shoulder Extension 10 reps;Limitations   Shoulder Extension Limitations yellow band, cues on form and technique   Rows 10 reps;Limitations   Rows Limitations yellow band, cues on form and technique   Horizontal ABduction 10 reps;Limitations   Horizontal ABduction Limitations yellow band, cues on form and technique     Neck Exercises: Standing   Other Standing Exercises with red pball at wall: rolling up for flexion stretch at top x 10 reps; circles with emphasis on scapular stabilization x 10 each way     Neck Exercises: Seated   Shoulder Rolls Backwards;10 reps;Limitations   Shoulder Rolls Limitations cues for slow, controlled motions.     Neck Exercises: Supine   Neck Retraction 10 reps;Limitations;5 secs   Neck Retraction Limitations cues on hold time and ex form/technique     Manual Therapy   Manual Therapy Soft tissue mobilization;Myofascial release;Neural Stretch;Manual Traction   Manual therapy comments  for decreased pain and mucle tightness: started in supine, then right sidelying for manual therapy to left scapula area, then back to supine.                      Soft tissue mobilization to bil cervical paraspinals, upper traps, STM and scalenes   Myofascial Release to bil upper traps and rhomboids   Manual Traction gentle cervical distraction for decreased pain/tightness   Neural Stretch passive stretching of bil upper traps and scalenes.      Neck Exercises: Stretches   Upper Trapezius Stretch 3 reps;20 seconds;Limitations   Levator Stretch 3 reps;20 seconds;Limitations            PT Short Term Goals - 03/04/16 0846      PT SHORT TERM GOAL #1   Title STGs=LTGs           PT Long Term Goals -  03/04/16 0846      PT LONG TERM GOAL #1   Title The patient will be independent with HEP for ROM, flexibility, strengthening and postural stability.   Baseline Target date 04/03/2016   Time 4   Period Weeks     PT LONG TERM GOAL #2   Title The patient will improve functional status survey score from 53% to > or equal to 65% to demonstrate improved overall perception of mobility.   Baseline Target date 04/03/2016   Time 4   Period Weeks     PT LONG TERM GOAL #3   Title The patient will improve cervical rotation up to 58 deg bilaterally   Baseline Target date 04/03/2016   Time 4   Period Weeks     PT LONG TERM GOAL #4   Title The patient will improve bilateral UE strength from 4/5 shoulder flexion/abduction up to 5/5 for improved functional strength.   Baseline Target date 04/03/2016   Time 4   Period Weeks     PT LONG TERM GOAL #5   Title The patient will verbalize understanding of strategies for self mgmt of chronic pain.   Baseline Target date 04/03/2016   Time 4   Period Weeks           Plan - 03/17/16 [id number]    Clinical Impression Statement today's session continued to focus on decreasing pain/tightness and on increasing strengthening/range of motion. Pt reported feeling less pain/tightness after session. Pt is making steady progress toward goals and should benefit from continued PT to progres toward unmet goals.    Rehab Potential Good   PT Frequency 2x / week   PT Duration 4 weeks   PT Treatment/Interventions ADLs/Self Care Home Management;Therapeutic activities;Therapeutic exercise;Neuromuscular re-education;Patient/family education;Cryotherapy;Moist Heat;Manual techniques   PT Next Visit Plan continued to work on decreased pain and strengthening, add towel roll to current chest stretch when pt able to tolerate, cervical AROM   Consulted and Agree with Plan of Care Patient      Patient will benefit from skilled therapeutic intervention in order to improve the following  deficits and impairments:  Decreased range of motion, Pain, Impaired UE functional use, Decreased activity tolerance, Impaired flexibility, Postural dysfunction, Decreased strength  Visit Diagnosis: Neck pain  Muscle weakness (generalized)  Abnormal posture     Problem List Patient Active Problem List   Diagnosis Date Noted  . Anemia 10/12/2015  . Glaucoma 09/16/2015  . Pulmonary nodules 12/26/2012  . Chronic rhinitis 12/26/2012    Sallyanne KusterKathy Bury, PTA, [facility] [phone] [address] [place], [place] [address] [phone] 03/17/16, 3:52 PM   Name: Stephanie Henson MRN: [id number] Date of Birth: [birth year]

## 2016-03-18 ENCOUNTER — Ambulatory Visit: Payer: Medicare Other | Admitting: Physical Therapy

## 2016-03-18 ENCOUNTER — Encounter: Payer: Self-pay | Admitting: Physical Therapy

## 2016-03-18 DIAGNOSIS — M542 Cervicalgia: Secondary | ICD-10-CM

## 2016-03-18 DIAGNOSIS — M6281 Muscle weakness (generalized): Secondary | ICD-10-CM

## 2016-03-18 DIAGNOSIS — R293 Abnormal posture: Secondary | ICD-10-CM

## 2016-03-18 NOTE — Patient Instructions (Addendum)
Sleeping on Side    Place pillow between knees. Use cervical support under neck and a roll around waist as needed.   Copyright  VHI. All rights reserved.    Posture - Sitting    Sit upright, head facing forward. Try using a roll to support lower back. Keep shoulders relaxed, and avoid rounded back. Keep hips level with knees. Avoid crossing legs for long periods.   Copyright  VHI. All rights reserved.  Reading    When reading/doing word searches, hold material in tilted position and maintain good sitting posture. Sit up straight and look up every 15-20 minutes for neck stretching to avoid stiffness.    Copyright  VHI. All rights reserved.  Alternating Positions    Alternate tasks and change positions frequently to reduce fatigue and muscle tension. Take rest breaks.   Copyright  VHI. All rights reserved.

## 2016-03-20 NOTE — Therapy (Signed)
[facility] [address] [place], [place], [address] Phone: [phone]   Fax:  [phone]  Physical Therapy Treatment  Patient Details  Name: Stephanie Henson MRN: [id number] Date of Birth: Feb 17, 1946 Referring Provider: Jeananne Rama, DO  Encounter Date: 03/18/2016   03/18/16 0938  PT Visits / Re-Eval  Visit Number 4  Number of Visits 8  Date for PT Re-Evaluation 04/03/16  Authorization  Authorization Type G code every 10th visit  PT Time Calculation  PT Start Time 0933  PT Stop Time 1015  PT Time Calculation (min) 42 min  PT - End of Session  Activity Tolerance Patient tolerated treatment well;Patient limited by pain  Behavior During Therapy [facility] for tasks assessed/performed     Past Medical History:  Diagnosis Date  . Anxiety   . Cervicalgia   . CKD (chronic kidney disease) stage 3, GFR 30-59 ml/min   . Diabetes mellitus (HCC)   . Environmental allergies   . FH: colonic polyps   . Fibromyalgia   . GERD (gastroesophageal reflux disease)   . Goiter   . Hematuria   . Hyperlipidemia   . Hypertension   . Osteoporosis   . Solitary pulmonary nodule     ;RUL - 7mm on CT 04/2014; CT 10/2014 resolution of nodule on RUL however persistant smaller nodules throughouts the lungs  . Weight loss     Past Surgical History:  Procedure Laterality Date  . BACK SURGERY    . NASAL SINUS SURGERY    . NECK SURGERY      There were no vitals filed for this visit.    03/18/16 0936  Symptoms/Limitations  Subjective No falls to report. Hurting all over today. Felt good after session yesterday, however woke up in more pain.  Pertinent History Fibromyalgia, h/o back surgery 1980, neck surgery 2004, shoulder surgery L side 2005  Patient Stated Goals Learn to manage the pain. "I really don't see how therapy can help."  Pain Assessment  Currently in Pain? Yes  Pain Score 9  Pain Location Neck  Pain Orientation  Mid;Lower  Pain Descriptors / Indicators Sore;Tender;Throbbing;Discomfort  Pain Type Chronic pain  Pain Radiating Towards into thoracic back and bil UE's  Pain Onset More than a month ago  Pain Frequency Constant  Aggravating Factors  gets worse as day progresses, cold weather  Pain Relieving Factors pain patches, heat      03/18/16 0938  Neck Exercises: Standing  Other Standing Exercises with red pball at wall: rolling up for flexion stretch at top x 10 reps; circles with emphasis on scapular stabilization x 10 each way  Neck Exercises: Seated  Shoulder Rolls Backwards;10 reps;Limitations  Shoulder Rolls Limitations cues for slow, controlled motions.  Other Seated Exercise scapular squeezes with 5 sec holds x 10 reps, cues on form and technique  X to V Limitations no weight's, cues on form and technique  X to V 15 reps;Limitations  Other Seated Exercise shoulder horizontal abduction with arms (elbows) in full extension,  no weight/resistance, x 10 reps with cues on ex form and technique  Cervical Rotation Both;10 reps;Limitations  Cervical Rotation Limitations x 10 each way within pain free ranges  Neck Exercises: Supine  Neck Retraction 10 reps;Limitations;5 secs  Neck Retraction Limitations cues on hold time and ex form/technique  Modalities  Modalities Moist Heat  Moist Heat Therapy  Number Minutes Moist Heat 10 Minutes  Moist Heat Location Cervical  Manual Therapy  Manual Therapy Soft tissue  mobilization;Myofascial release;Neural Stretch;Manual Traction  Manual therapy comments for decreased pain and mucle tightness: started in supine, then right sidelying for manual therapy to left scapula area, then back to supine.                     Soft tissue mobilization to bil cervical paraspinals, upper traps, STM and scalenes  Myofascial Release to bil upper traps and rhomboids  Manual Traction gentle cervical distraction for decreased pain/tightness  Neural Stretch passive  stretching of bil upper traps and scalenes.   Neck Exercises: Stretches  Upper Trapezius Stretch 3 reps;20 seconds;Limitations  Levator Stretch 3 reps;20 seconds;Limitations  Other Neck Stretches lateral flexion neck stretch for 20 sec's x 3 each side with cues on form and technique           PT Short Term Goals - 03/04/16 0846      PT SHORT TERM GOAL #1   Title STGs=LTGs           PT Long Term Goals - 03/04/16 0846      PT LONG TERM GOAL #1   Title The patient will be independent with HEP for ROM, flexibility, strengthening and postural stability.   Baseline Target date 04/03/2016   Time 4   Period Weeks     PT LONG TERM GOAL #2   Title The patient will improve functional status survey score from 53% to > or equal to 65% to demonstrate improved overall perception of mobility.   Baseline Target date 04/03/2016   Time 4   Period Weeks     PT LONG TERM GOAL #3   Title The patient will improve cervical rotation up to 58 deg bilaterally   Baseline Target date 04/03/2016   Time 4   Period Weeks     PT LONG TERM GOAL #4   Title The patient will improve bilateral UE strength from 4/5 shoulder flexion/abduction up to 5/5 for improved functional strength.   Baseline Target date 04/03/2016   Time 4   Period Weeks     PT LONG TERM GOAL #5   Title The patient will verbalize understanding of strategies for self mgmt of chronic pain.   Baseline Target date 04/03/2016   Time 4   Period Weeks        03/18/16 [id number]  Plan  Clinical Impression Statement Today's session addressed decreased pain/tightness with pt reporting decreased pain after heat and manual therapy to 3/10. Remainder of session worked on active stretching and gentle strenthening with decrease in resistance/weights with no issues reported. Pt is making slow, steady progress toward goals Pt should benefit from continued PT to progress toward unmet goals.                       Pt will benefit from skilled therapeutic  intervention in order to improve on the following deficits Decreased range of motion;Pain;Impaired UE functional use;Decreased activity tolerance;Impaired flexibility;Postural dysfunction;Decreased strength  Rehab Potential Good  PT Frequency 2x / week  PT Duration 4 weeks  PT Treatment/Interventions ADLs/Self Care Home Management;Therapeutic activities;Therapeutic exercise;Neuromuscular re-education;Patient/family education;Cryotherapy;Moist Heat;Manual techniques  PT Next Visit Plan continued to work on decreased pain and strengthening, add towel roll to current chest stretch when pt able to tolerate, cervical AROM  Consulted and Agree with Plan of Care Patient          Patient will benefit from skilled therapeutic intervention in order to improve the following deficits and impairments:  Decreased  range of motion, Pain, Impaired UE functional use, Decreased activity tolerance, Impaired flexibility, Postural dysfunction, Decreased strength  Visit Diagnosis: Neck pain  Muscle weakness (generalized)  Abnormal posture     Problem List Patient Active Problem List   Diagnosis Date Noted  . Anemia 10/12/2015  . Glaucoma 09/16/2015  . Pulmonary nodules 12/26/2012  . Chronic rhinitis 12/26/2012    Sallyanne Kuster, PTA, [facility] Outpatient Neuro [facility] [address] [place], [place] [address] [phone] 03/20/16, 9:25 PM   Name: Stephanie Henson MRN: [id number] Date of Birth: 06-10-1945

## 2016-03-23 ENCOUNTER — Ambulatory Visit: Payer: Medicare Other | Admitting: Rehabilitative and Restorative Service Providers"

## 2016-03-24 ENCOUNTER — Ambulatory Visit: Payer: Medicare Other | Admitting: Physical Therapy

## 2016-03-25 ENCOUNTER — Ambulatory Visit: Payer: Medicare Other | Admitting: Physical Therapy

## 2016-03-25 ENCOUNTER — Ambulatory Visit (INDEPENDENT_AMBULATORY_CARE_PROVIDER_SITE_OTHER): Payer: Medicare Other | Admitting: Podiatry

## 2016-03-25 DIAGNOSIS — Q828 Other specified congenital malformations of skin: Secondary | ICD-10-CM

## 2016-03-25 DIAGNOSIS — M722 Plantar fascial fibromatosis: Secondary | ICD-10-CM

## 2016-03-25 NOTE — Patient Instructions (Signed)

## 2016-03-25 NOTE — Progress Notes (Signed)
Subjective: Patient presents to the office today to [facility]. She states today that she has a painful callus to the ball of her right foot and points to sub metatarsal 2 where she gets the majority of pain with walking. Denies stepping on any foreign object. No swelling, redness, drainage.  Her heel is doing better. Denies any systemic complaints such as fevers, chills, nausea, vomiting. No acute changes since last appointment, and no other complaints at this time.   Objective: AAO x3, NAD DP/PT pulses palpable bilaterally, CRT less than 3 seconds Small annular taking a hyperkeratotic lesion present right foot submetatarsal 2. Upon debridement there is no underlying ulceration, drainage or signs of infection. No foreign body was identified. Prominence the metatarsal heads plantarly with atrophy of the fat pad. HAV interspinous present bilaterally. No tenderness along the plantar fascia. No open lesions or pre-ulcerative lesions.  No pain with calf compression, swelling, warmth, erythema  Assessment: Porokeratosis right foot  Plan: -All treatment options discussed with the patient including all alternatives, risks, complications.  -Orthotics were dispensed. Oral and written break in instructions were discussed. -Hyperkeratotic lesion sharply debrided 1 without complications or bleeding. -Follow-up in 4 weeks or sooner if needed. -Patient encouraged to call the office with any questions, concerns, change in symptoms.   Ovid CurdMatthew Jordan Caraveo, DPM

## 2016-03-30 ENCOUNTER — Ambulatory Visit: Payer: Medicare Other | Admitting: Rehabilitative and Restorative Service Providers"

## 2016-03-31 ENCOUNTER — Ambulatory Visit: Payer: Medicare Other | Attending: Rheumatology | Admitting: Rehabilitative and Restorative Service Providers"

## 2016-03-31 DIAGNOSIS — M542 Cervicalgia: Secondary | ICD-10-CM | POA: Diagnosis not present

## 2016-03-31 DIAGNOSIS — M6281 Muscle weakness (generalized): Secondary | ICD-10-CM | POA: Diagnosis present

## 2016-03-31 DIAGNOSIS — R293 Abnormal posture: Secondary | ICD-10-CM | POA: Insufficient documentation

## 2016-03-31 NOTE — Patient Instructions (Signed)
Thoracic Self-Mobilization (Supine)     Use a towel roll between shoulder blades.  Lie with arms outstretched to the side and hold for 1 minute.  Rest.  Repeat for a total of 3 times. 2 times/day. http://orth.exer.us/1001   Copyright  VHI. All rights reserved.   Healthy Back - Shoulder Roll    Stand straight with arms relaxed at sides. Roll shoulders backward continuously. Do _5-10___ times. This exercise can also be done one shoulder at a time.  Copyright  VHI. All rights reserved.      Electronically signed by Berneice Heinrichhristina M Treavon Castilleja, PT at 03/03/2016 10:13 AM    Physical Therapy      While lying on your back with a towel under your head: press your head down into the mat. You should feel the muscles on the back of your neck get tight. Hold for 5 seconds. Perform 10 reps.   Scapular Retraction (Standing)    Either lying on back, seated or standing: With arms at sides, pinch shoulder blades together. Hold for 5 seconds.  Repeat _10_ times per set. Do _1-2__ sessions per day.  http://orth.exer.us/945   Copyright  VHI. All rights reserved.    Upper Limb Neural Tension: Radial I    In seated position: Place right arm across low back and turn head down toward other side. IF No stretch is felt: Gently increase stretch by pulling down on head and depressing shoulder girdle. Hold for 20 seconds.  Repeat _3_ times each side. Do __1-2__ sessions per day.  http://orth.exer.us/408   Copyright  VHI. All rights reserved.       Horizontal Abduction (Resistive Band)    With arms at shoulder level, keep elbows straight. CAN HOLD BAND IN PALMS THUMBS POINTING OUT.  Pull hands apart and hold for 3 seconds.  Repeat __5__ times. Do _1-2___ sessions per day.  Copyright  VHI. All rights reserved.

## 2016-03-31 NOTE — Therapy (Signed)
[facility] [address] [place], [place], [address] Phone: [phone]   Fax:  [phone]  Physical Therapy Treatment  Patient Details  Name: Stephanie Henson MRN: [id number] Date of Birth: [birth year] Referring Provider: Marcelle Smiling, DO  Encounter Date: 03/31/2016      PT End of Session - 03/31/16 1409    Visit Number 5   Number of Visits 8   Date for PT Re-Evaluation 04/03/16   Authorization Type G code every 10th visit   PT Start Time 1315   PT Stop Time 1400   PT Time Calculation (min) 45 min   Activity Tolerance Patient tolerated treatment well;Patient limited by pain   Behavior During Therapy [facility] for tasks assessed/performed      Past Medical History:  Diagnosis Date  . Anxiety   . Cervicalgia   . CKD (chronic kidney disease) stage 3, GFR 30-59 ml/min   . Diabetes mellitus ([place])   . Environmental allergies   . FH: colonic polyps   . Fibromyalgia   . GERD (gastroesophageal reflux disease)   . Goiter   . Hematuria   . Hyperlipidemia   . Hypertension   . Osteoporosis   . Solitary pulmonary nodule     ;RUL - 48m on CT 04/2014; CT 10/2014 resolution of nodule on RUL however persistant smaller nodules throughouts the lungs  . Weight loss     Past Surgical History:  Procedure Laterality Date  . BACK SURGERY    . NASAL SINUS SURGERY    . NECK SURGERY      There were no vitals filed for this visit.      Subjective Assessment - 03/31/16 1317    Subjective The patient reports no pain at rest.  She is wearing pain patches.  She notes that her pain was so bad after last visit when she didn't wear pain patches that it was intolerable.     Pertinent History Fibromyalgia, h/o back surgery 1980, neck surgery 2004, shoulder surgery L side 2005   Patient Stated Goals Learn to manage the pain. "I really don't see how therapy can help."   Currently in Pain? No/denies  none right now, does still increase to  10/10   Aggravating Factors  worse later in day   Pain Relieving Factors pain patches                         [facility] Adult PT Treatment/Exercise - 03/31/16 1411      Neck Exercises: Theraband   Horizontal ABduction 10 reps;Limitations   Horizontal ABduction Limitations yellow band, cues on form and technique     Neck Exercises: Standing   Other Standing Exercises shoulder stabilization rolling a ball clockwise and counterclockwise x 30 seconds each direction R and L sides.  Standing wall push up (partial motion) within tolerance x 5 reps x 2 sets.     Other Standing Exercises Standing shoulder/trunk rotation reaching R and L sides while holding onto countertop.      Neck Exercises: Seated   Other Seated Exercise scapular retraction diagonal shoulder flexion with yellow theraband x 5 reps each side; seated shoulder rolls, seated levator stretch     Neck Exercises: Supine   Neck Retraction 10 reps;Limitations;5 secs   Neck Retraction Limitations cues on hold time and ex form/technique   Capital Flexion 5 reps   Capital Flexion Limitations with cues on chin tuck prior to lifting for positioning  Neck Exercises: Prone   Axial Exentsion 5 reps  combining shoulder retraction with movement.   Other Prone Exercise Prone with arms at neutral and lifting into shoulder extension x 5 reps.      Manual Therapy   Manual Therapy Manual Traction;Passive ROM;Soft tissue mobilization   Manual therapy comments for relaxation of muscles post exxercise   Passive ROM gentle overpressure into neck lateral flexion and flexion/extension   Manual Traction gentle cervical traction to reduce muscle tightness; patient reports relief of tightness during manual traction   Neural Stretch passive stretch                PT Education - 03/31/16 1402    Education provided Yes   Education Details Reviewed prior HEP of towel stretches, shoulder circles, chin tucks, scapular retraction,  levator stretch, and added theraband scapular retraction   Person(s) Educated Patient   Methods Explanation;Demonstration;Handout   Comprehension Verbalized understanding;Returned demonstration          PT Short Term Goals - 03/04/16 0846      PT SHORT TERM GOAL #1   Title STGs=LTGs           PT Long Term Goals - 03/31/16 1409      PT LONG TERM GOAL #1   Title The patient will be independent with HEP for ROM, flexibility, strengthening and postural stability.   Baseline Met on 03/31/2016   Time 4   Period Weeks   Status Achieved     PT LONG TERM GOAL #2   Title The patient will improve functional status survey score from 53% to > or equal to 65% to demonstrate improved overall perception of mobility.   Baseline Target date 04/03/2016   Time 4   Period Weeks     PT LONG TERM GOAL #3   Title The patient will improve cervical rotation up to 58 deg bilaterally   Baseline Target date 04/03/2016   Time 4   Period Weeks     PT LONG TERM GOAL #4   Title The patient will improve bilateral UE strength from 4/5 shoulder flexion/abduction up to 5/5 for improved functional strength.   Baseline Target date 04/03/2016   Time 4   Period Weeks     PT LONG TERM GOAL #5   Title The patient will verbalize understanding of strategies for self mgmt of chronic pain.   Baseline Target date 04/03/2016   Time 4   Period Weeks               Plan - 03/31/16 1409    Clinical Impression Statement The patient notes some discomfort trying new activities of prone exercises.  She is tolerating general HEP well and PT beginning to add light resistance to improve postural stability.  Anticipate discharge next visit depending on patient progress with current program.    PT Treatment/Interventions ADLs/Self Care Home Management;Therapeutic activities;Therapeutic exercise;Neuromuscular re-education;Patient/family education;Cryotherapy;Moist Heat;Manual techniques   PT Next Visit Plan Check LTGs;  add further shoulder strengthening to tolerance to HEP, postural stabilization.   Consulted and Agree with Plan of Care Patient      Patient will benefit from skilled therapeutic intervention in order to improve the following deficits and impairments:  Decreased range of motion, Pain, Impaired UE functional use, Decreased activity tolerance, Impaired flexibility, Postural dysfunction, Decreased strength  Visit Diagnosis: Neck pain  Muscle weakness (generalized)  Abnormal posture     Problem List Patient Active Problem List   Diagnosis Date Noted  .  Plantar fasciitis 03/25/2016  . Anemia 10/12/2015  . Glaucoma 09/16/2015  . Pulmonary nodules 12/26/2012  . Chronic rhinitis 12/26/2012    Stephanie Henson, PT 03/31/2016, 2:16 PM  [facility] [address] [place], [place], [address] Phone: [phone]   Fax:  [phone]  Name: Stephanie Henson MRN: [id number] Date of Birth: 07/31/1945

## 2016-04-07 ENCOUNTER — Ambulatory Visit: Payer: Medicare Other | Admitting: Rehabilitative and Restorative Service Providers"

## 2016-04-07 DIAGNOSIS — M542 Cervicalgia: Secondary | ICD-10-CM | POA: Diagnosis not present

## 2016-04-07 DIAGNOSIS — R293 Abnormal posture: Secondary | ICD-10-CM

## 2016-04-07 DIAGNOSIS — M6281 Muscle weakness (generalized): Secondary | ICD-10-CM

## 2016-04-07 NOTE — Patient Instructions (Signed)
External Rotation (Eccentric), (Resistance Band)    Pull band to the side emphasizing squeezing shoulder blades together.  Keep elbows at sides, wrists neutral. Slowly return to start for 3-5 seconds. Use yellow resistance band. _5__ reps per set, _1__ sets per day. *work up to 10 repetitions.  http://ecce.exer.us/197   Copyright  VHI. All rights reserved.

## 2016-04-07 NOTE — Therapy (Signed)
[facility] [address] [place], [place], [address] Phone: [phone]   Fax:  [phone]  Physical Therapy Treatment  Patient Details  Name: Stephanie Henson MRN: [id number] Date of Birth: 02/02/1946 Referring Provider: Shaili Devashwar, DO  Encounter Date: 04/07/2016      PT End of Session - 04/07/16 0940    Visit Number 6   Number of Visits 8   Date for PT Re-Evaluation 04/03/16   Authorization Type G code every 10th visit   PT Start Time 0935   PT Stop Time 1008   PT Time Calculation (min) 33 min   Activity Tolerance Patient tolerated treatment well;Patient limited by pain   Behavior During Therapy WFL for tasks assessed/performed      Past Medical History:  Diagnosis Date  . Anxiety   . Cervicalgia   . CKD (chronic kidney disease) stage 3, GFR 30-59 ml/min   . Diabetes mellitus ([facility])   . Environmental allergies   . FH: colonic polyps   . Fibromyalgia   . GERD (gastroesophageal reflux disease)   . Goiter   . Hematuria   . Hyperlipidemia   . Hypertension   . Osteoporosis   . Solitary pulmonary nodule     ;RUL - 7mm on CT 04/2014; CT 10/2014 resolution of nodule on RUL however persistant smaller nodules throughouts the lungs  . Weight loss     Past Surgical History:  Procedure Laterality Date  . BACK SURGERY    . NASAL SINUS SURGERY    . NECK SURGERY      There were no vitals filed for this visit.      Subjective Assessment - 04/07/16 0938    Subjective The patient was sore after last session noting that she should avoid prone on elbows position due to h/o elbow arthritis.  The soreness dissipated.     Patient Stated Goals Learn to manage the pain. "I really don't see how therapy can help."   Currently in Pain? Yes   Pain Score 7    Pain Location Neck   Pain Descriptors / Indicators Aching;Sore   Aggravating Factors  worse with damp weather   Pain Relieving Factors pain patches             [facility] PT Assessment - 04/07/16 0944      AROM   Cervical - Right Rotation 65   Cervical - Left Rotation 52                     [facility] Adult PT Treatment/Exercise - 04/07/16 0944      Self-Care   Self-Care Other Self-Care Comments   Other Self-Care Comments  Discussed home mgmt of pain and tightness and continued home program post discharge.       Exercises   Exercises Other Exercises   Other Exercises  modified HEP from shoulder horiozntal abduction at 90 to arms at neutral.       Manual Therapy   Manual Therapy Soft tissue mobilization   Manual therapy comments for decreased pain and muscle tightness performed in supine and in sidelying for parascapular region   Soft tissue mobilization to bilateral upper trapezius musculature and parascapular musculature   Neural Stretch passive stretch sidelying opening up shoulder into horizontal abduction with neural glide.                 PT Education - 04/07/16 0956    Education provided Yes   Education Details   modified HEP to keep shoulder abduction with arms at neutral position with yellow band   Person(s) Educated Patient   Methods Explanation;Demonstration   Comprehension Verbalized understanding          PT Short Term Goals - 03/04/16 0846      PT SHORT TERM GOAL #1   Title STGs=LTGs           PT Long Term Goals - 04/21/16 0943      PT LONG TERM GOAL #1   Title The patient will be independent with HEP for ROM, flexibility, strengthening and postural stability.   Baseline Met on 03/31/2016   Time 4   Period Weeks   Status Achieved     PT LONG TERM GOAL #2   Title The patient will improve functional status survey score from 53% to > or equal to 65% to demonstrate improved overall perception of mobility.   Baseline Target date 04/03/2016   Time 4   Period Weeks   Status Deferred     PT LONG TERM GOAL #3   Title The patient will improve cervical rotation up to 58 deg bilaterally    Baseline R rotation=65 degrees; L rotation=52 degrees.   Time 4   Period Weeks   Status Partially Met     PT LONG TERM GOAL #4   Title The patient will improve bilateral UE strength from 4/5 shoulder flexion/abduction up to 5/5 for improved functional strength.   Baseline Patient continues with 4/5 bilateral UE strength with pain upon resistance in shoulders (L pain>R pain).   Time 4   Period Weeks   Status Not Met     PT LONG TERM GOAL #5   Title The patient will verbalize understanding of strategies for self mgmt of chronic pain.   Baseline Target date 04/03/2016   Time 4   Period Weeks   Status Achieved               Plan - Apr 21, 2016 Apr 06, 2038    Clinical Impression Statement The patient partially met LTGs with improvement in ROM and ability to perform stabilization exercises for mgmt of chronic pain.  Patient feels able to complete HEP to post d/c mgmt of symptoms.    PT Next Visit Plan Discharge today.   Consulted and Agree with Plan of Care Patient      Patient will benefit from skilled therapeutic intervention in order to improve the following deficits and impairments:     Visit Diagnosis: Neck pain  Muscle weakness (generalized)  Abnormal posture       G-Codes - 21-Apr-2016 2049/04/06    Functional Assessment Tool Used functional status survey 53%   Functional Limitation Mobility: Walking and moving around   Mobility: Walking and Moving Around Goal Status [phone]) At least 1 percent but less than 20 percent impaired, limited or restricted   Mobility: Walking and Moving Around Discharge Status [phone]) At least 20 percent but less than 40 percent impaired, limited or restricted     PHYSICAL THERAPY DISCHARGE SUMMARY  Visits from Start of Care: 6  Current functional level related to goals / functional outcomes: See above   Remaining deficits: Chronic pain with PT emphasis on self mgmt and postural stabilization + stretching.   Education / Equipment: HEP, post d/c  plan for posture/strengthening.  Plan: Patient agrees to discharge.  Patient goals were partially met. Patient is being discharged due to meeting the stated rehab goals.  ?????  Thank you for the referral of this patient. Rudell Cobb, MPT  [place], PT 04/07/2016, 8:52 PM  [facility] [address] [place], [place], [address] Phone: [phone]   Fax:  [phone]  Name: CHARITIE HINOTE MRN: [id number] Date of Birth: [birth year]

## 2016-04-08 ENCOUNTER — Ambulatory Visit: Payer: Medicare Other | Admitting: Podiatry

## 2016-04-11 ENCOUNTER — Ambulatory Visit: Payer: Medicare Other | Admitting: Rheumatology

## 2016-04-21 ENCOUNTER — Ambulatory Visit: Payer: Medicare Other | Admitting: Rheumatology

## 2016-04-22 ENCOUNTER — Emergency Department (HOSPITAL_COMMUNITY): Payer: Medicare Other

## 2016-04-22 ENCOUNTER — Emergency Department (HOSPITAL_COMMUNITY)
Admission: EM | Admit: 2016-04-22 | Discharge: 2016-04-22 | Discharge status: Against Medical Advice | Payer: Medicare Other | Attending: Physician Assistant | Admitting: Physician Assistant

## 2016-04-22 DIAGNOSIS — N183 Chronic kidney disease, stage 3 (moderate): Secondary | ICD-10-CM | POA: Diagnosis not present

## 2016-04-22 DIAGNOSIS — Z79899 Other long term (current) drug therapy: Secondary | ICD-10-CM | POA: Insufficient documentation

## 2016-04-22 DIAGNOSIS — R079 Chest pain, unspecified: Secondary | ICD-10-CM

## 2016-04-22 DIAGNOSIS — R0789 Other chest pain: Secondary | ICD-10-CM | POA: Insufficient documentation

## 2016-04-22 DIAGNOSIS — I129 Hypertensive chronic kidney disease with stage 1 through stage 4 chronic kidney disease, or unspecified chronic kidney disease: Secondary | ICD-10-CM | POA: Diagnosis not present

## 2016-04-22 DIAGNOSIS — E1122 Type 2 diabetes mellitus with diabetic chronic kidney disease: Secondary | ICD-10-CM | POA: Insufficient documentation

## 2016-04-22 DIAGNOSIS — R42 Dizziness and giddiness: Secondary | ICD-10-CM | POA: Insufficient documentation

## 2016-04-22 DIAGNOSIS — Z7982 Long term (current) use of aspirin: Secondary | ICD-10-CM | POA: Diagnosis not present

## 2016-04-22 LAB — CBC
HEMATOCRIT: 36.4 % (ref 36.0–46.0)
Hemoglobin: 11 g/dL — ABNORMAL LOW (ref 12.0–15.0)
MCH: 22.8 pg — ABNORMAL LOW (ref 26.0–34.0)
MCHC: 30.2 g/dL (ref 30.0–36.0)
MCV: 75.4 fL — AB (ref 78.0–100.0)
Platelets: 210 10*3/uL (ref 150–400)
RBC: 4.83 MIL/uL (ref 3.87–5.11)
RDW: 16.1 % — AB (ref 11.5–15.5)
WBC: 6.2 10*3/uL (ref 4.0–10.5)

## 2016-04-22 LAB — LIPASE, BLOOD: Lipase: 39 U/L (ref 11–51)

## 2016-04-22 LAB — BASIC METABOLIC PANEL
Anion gap: 10 (ref 5–15)
BUN: 13 mg/dL (ref 6–20)
CHLORIDE: 106 mmol/L (ref 101–111)
CO2: 25 mmol/L (ref 22–32)
Calcium: 9.4 mg/dL (ref 8.9–10.3)
Creatinine, Ser: 0.83 mg/dL (ref 0.44–1.00)
GFR calc Af Amer: >60 mL/min (ref >60)
GFR calc non Af Amer: >60 mL/min (ref >60)
GLUCOSE: 126 mg/dL — AB (ref 65–99)
POTASSIUM: 3.8 mmol/L (ref 3.5–5.1)
SODIUM: 141 mmol/L (ref 135–145)

## 2016-04-22 LAB — URINALYSIS, ROUTINE W REFLEX MICROSCOPIC
Bilirubin Urine: NEGATIVE
Glucose, UA: NEGATIVE mg/dL
KETONES UR: NEGATIVE mg/dL
LEUKOCYTES UA: NEGATIVE
Nitrite: NEGATIVE
PH: 7 (ref 5.0–8.0)
PROTEIN: NEGATIVE mg/dL
SQUAMOUS EPITHELIAL / LPF: NONE SEEN
Specific Gravity, Urine: 1.003 — ABNORMAL LOW (ref 1.005–1.030)

## 2016-04-22 LAB — I-STAT CG4 LACTIC ACID, ED: LACTIC ACID, VENOUS: 0.83 mmol/L (ref 0.5–1.9)

## 2016-04-22 LAB — PROTIME-INR
INR: 1.01
PROTHROMBIN TIME: 13.3 s (ref 11.4–15.2)

## 2016-04-22 LAB — I-STAT TROPONIN, ED: TROPONIN I, POC: 0 ng/mL (ref 0.00–0.08)

## 2016-04-22 LAB — CBG MONITORING, ED: GLUCOSE-CAPILLARY: 128 mg/dL — AB (ref 65–99)

## 2016-04-22 MED ORDER — ASPIRIN 81 MG PO CHEW
324.0000 mg | CHEWABLE_TABLET | Freq: Once | ORAL | Status: AC
  Administered 2016-04-22: 324 mg via ORAL
  Filled 2016-04-22: qty 4

## 2016-04-22 MED ORDER — IBUPROFEN 400 MG PO TABS
400.0000 mg | ORAL_TABLET | Freq: Once | ORAL | Status: AC
  Administered 2016-04-22: 400 mg via ORAL
  Filled 2016-04-22: qty 1

## 2016-04-22 NOTE — ED Notes (Signed)
Patient transported to X-ray 

## 2016-04-22 NOTE — Discharge Instructions (Signed)
You were seen today with chest pain. We wanted to admit you to further workup or chest pain however you would like to go home. Please follow-up with a cardiologist and is possible if you have any chest pain please return if you feel like he wanted be seen again please return anytime/.

## 2016-04-22 NOTE — ED Provider Notes (Signed)
[facility] Provider Note   CSN: [id number] Arrival date & time: 04/22/16  [id number]     History   Chief Complaint Chief Complaint  Patient presents with  . Weakness    HPI Stephanie Henson is a 71 y.o. female.  HPI   Patient is a 71 year old female with past medical history significant for hypertension hyperlipidemia diabetes presenting today with atypical chest pain. Patient had pain radiating to her left arm numbness in her left arm and pain radiating into her left jaw. This happened around 5 AM when she was awoken from sleep.  Patient now has a mild heaviness on her chest.  This episode was accompanied by shortness of breath, dizziness.  Husband is concerned because she hit her head a couple nights ago on the table.   Past Medical History:  Diagnosis Date  . Anxiety   . Cervicalgia   . CKD (chronic kidney disease) stage 3, GFR 30-59 ml/min   . Diabetes mellitus (HCC)   . Environmental allergies   . FH: colonic polyps   . Fibromyalgia   . GERD (gastroesophageal reflux disease)   . Goiter   . Hematuria   . Hyperlipidemia   . Hypertension   . Osteoporosis   . Solitary pulmonary nodule     ;RUL - 7mm on CT 04/2014; CT 10/2014 resolution of nodule on RUL however persistant smaller nodules throughouts the lungs  . Weight loss     Patient Active Problem List   Diagnosis Date Noted  . Plantar fasciitis 03/25/2016  . Anemia 10/12/2015  . Glaucoma 09/16/2015  . Pulmonary nodules 12/26/2012  . Chronic rhinitis 12/26/2012    Past Surgical History:  Procedure Laterality Date  . BACK SURGERY    . NASAL SINUS SURGERY    . NECK SURGERY      OB History    No data available       Home Medications    Prior to Admission medications   Medication Sig Start Date End Date Taking? Authorizing Provider  ALPHAGAN P 0.1 % SOLN INSTILL 1 DROP IN BOTH EYES THREE TIMES A DAY 12/24/14  Yes Historical Provider, MD  aspirin 81 MG tablet Take 81 mg by mouth daily.    Yes Historical Provider, MD  azelastine (ASTELIN) 0.1 % nasal spray USE 2 SPRAYS INTO EACH NOSTRIL TWICE A DAY 05/20/15  Yes Historical Provider, MD  BETIMOL 0.5 % ophthalmic solution Place 1 drop into both eyes daily. Each eye once per day 11/06/12  Yes Historical Provider, MD  Calcium Carbonate-Vitamin D (CALCIUM 500 + D PO) Take 1 tablet by mouth daily.   Yes Historical Provider, MD  CAPSAICIN HOT PATCH EX Apply 1 patch topically daily as needed (pain).   Yes Historical Provider, MD  latanoprost (XALATAN) 0.005 % ophthalmic solution Place 1 drop into both eyes at bedtime.  12/22/12  Yes Historical Provider, MD  LORazepam (ATIVAN) 0.5 MG tablet Take 0.5 mg by mouth 2 (two) times daily.   Yes Historical Provider, MD  Omega-3 Fatty Acids (FISH OIL) 1000 MG CAPS Take 2 capsules by mouth daily.    Yes Historical Provider, MD  rosuvastatin (CRESTOR) 10 MG tablet Take 10 mg by mouth daily. 02/04/16  Yes Historical Provider, MD  cyclobenzaprine (FLEXERIL) 10 MG tablet Take 1 tablet (10 mg total) by mouth at bedtime. Patient not taking: Reported on 04/22/2016 12/04/15   Doyle AskewKimberly Stephenia Harris, FNP  diclofenac sodium (VOLTAREN) 1 % GEL Apply gel up to two times daily  for relief of pain and inflammation. Patient not taking: Reported on 03/03/2016 11/25/15   Doyle Askew, FNP  traMADol (ULTRAM) 50 MG tablet Take 1 tablet (50 mg total) by mouth every 8 (eight) hours as needed. Patient not taking: Reported on 03/03/2016 12/28/15   Doyle Askew, FNP    Family History Family History  Problem Relation Age of Onset  . Heart disease Father   . Heart disease Brother   . Lung cancer Sister     smoked    Social History Social History  Substance Use Topics  . Smoking status: Never Smoker  . Smokeless tobacco: Never Used  . Alcohol use No     Allergies   Asa [aspirin]; Codeine; Sudafed [pseudoephedrine hcl]; and Tylenol [acetaminophen]   Review of Systems Review of Systems    Constitutional: Negative for activity change.  Respiratory: Negative for shortness of breath.   Cardiovascular: Negative for chest pain.  Gastrointestinal: Negative for abdominal pain.     Physical Exam Updated Vital Signs BP 147/70   Pulse 66   Resp 21   SpO2 100%   Physical Exam  Constitutional: She is oriented to person, place, and time. She appears well-developed and well-nourished.  HENT:  Head: Normocephalic and atraumatic.  Eyes: Right eye exhibits no discharge.  Cardiovascular: Normal rate, regular rhythm and normal heart sounds.   No murmur heard. Pulmonary/Chest: Effort normal and breath sounds normal. She has no wheezes. She has no rales.  Abdominal: Soft. She exhibits no distension. There is no tenderness.  Neurological: She is oriented to person, place, and time.  Skin: Skin is warm and dry. She is not diaphoretic.  Psychiatric: She has a normal mood and affect.  Nursing note and vitals reviewed.    ED Treatments / Results  Labs (all labs ordered are listed, but only abnormal results are displayed) Labs Reviewed  BASIC METABOLIC PANEL - Abnormal; Notable for the following:       Result Value   Glucose, Bld 126 (*)    All other components within normal limits  CBC - Abnormal; Notable for the following:    Hemoglobin 11.0 (*)    MCV 75.4 (*)    MCH 22.8 (*)    RDW 16.1 (*)    All other components within normal limits  URINALYSIS, ROUTINE W REFLEX MICROSCOPIC - Abnormal; Notable for the following:    Color, Urine STRAW (*)    Specific Gravity, Urine 1.003 (*)    Hgb urine dipstick SMALL (*)    Bacteria, UA RARE (*)    All other components within normal limits  CBG MONITORING, ED - Abnormal; Notable for the following:    Glucose-Capillary 128 (*)    All other components within normal limits  LIPASE, BLOOD  PROTIME-INR  I-STAT CG4 LACTIC ACID, ED  I-STAT TROPOININ, ED    EKG  EKG Interpretation  Date/Time:  Friday April 22 2016 09:16:02  EST Ventricular Rate:  56 PR Interval:    QRS Duration: 89 QT Interval:  408 QTC Calculation: 394 R Axis:   -30 Text Interpretation:  Sinus rhythm Left axis deviation Baseline wander in lead(s) V6 Normal sinus rhythm No significant change since last tracing Confirmed by Kandis Mannan ([id number]) on 04/22/2016 9:37:17 AM       Radiology Dg Chest 2 View  Result Date: 04/22/2016 CLINICAL DATA:  Numbness in both hands chest pain, initial encounter EXAM: CHEST  2 VIEW COMPARISON:  11/14/2014 FINDINGS: Cardiac shadow is within normal limits.  The lungs are clear bilaterally with the exception of mild right middle lobe scarring. This is stable from prior exam. No acute infiltrate or sizable effusion is noted. Mild degenerative change of the thoracic spine is seen. Postsurgical changes in the cervical spine are noted. IMPRESSION: Stable scarring in the right middle lobe. No acute abnormality noted. Electronically Signed   By: Alcide Clever M.D.   On: 04/22/2016 09:44   Ct Head Wo Contrast  Result Date: 04/22/2016 CLINICAL DATA:  Hit head on corner of table two days prior with persistent pain and dizziness. Left arm weakness. EXAM: CT HEAD WITHOUT CONTRAST TECHNIQUE: Contiguous axial images were obtained from the base of the skull through the vertex without intravenous contrast. COMPARISON:  Head CT February 26, 2015 and brain MRI February 26, 2015 FINDINGS: Brain: The ventricles are normal in size and configuration. There is no intracranial mass, hemorrhage, extra-axial fluid collection, or midline shift. There is slight small vessel disease in the anterior centra semiovale bilaterally. Elsewhere gray-white compartments appear normal. No acute infarct evident. Vascular: No hyperdense vessels. There is slight calcification in the carotid siphon regions bilaterally. Skull: Bony calvarium appears intact. Sinuses/Orbits: There is evidence of previous surgery in the ethmoid regions. Areas of patchy mild mucosal  thickening noted in the ethmoid regions. All antrostomy defects are present bilaterally. Other visualized paranasal sinuses are clear. Orbits appear symmetric bilaterally. Other: There is opacification of multiple mastoid air cells on the right. Mastoids on the left are clear. IMPRESSION: Rather minimal periventricular small vessel disease. No intracranial mass, hemorrhage, or extra-axial fluid collection. Slight arterial calcification carotid siphon regions. Areas of previous postoperative change in paranasal sinuses with mild mucosal thickening in several ethmoid regions. There is opacification of multiple mastoid air cells on the right, not present on most recent prior study. Electronically Signed   By: Bretta Bang III M.D.   On: 04/22/2016 09:58    Procedures Procedures (including critical care time)  Medications Ordered in ED Medications  ibuprofen (ADVIL,MOTRIN) tablet 400 mg (400 mg Oral Given 04/22/16 1024)  aspirin chewable tablet 324 mg (324 mg Oral Given 04/22/16 1303)     Initial Impression / Assessment and Plan / ED Course  I have reviewed the triage vital signs and the nursing notes.  Pertinent labs & imaging results that were available during my care of the patient were reviewed by me and considered in my medical decision making (see chart for details).     Patient is a 71 year old female with history of hypertension hyperlipidemia and diabetes presenting with atypical chest pain. She had an episode a couple hour goes when she had left arm numbness pain into her left jaw, shortness of breath and dizziness. This is concerning for an ischemic equivalent. We'll get EKG, troponin, likely need to admit for serial troponins given  elevated heart score  Patient had normal first trop and EKG.  Patient is requesting to leave.  We discussed that she should probably stay for admission, or at very least a second troponin. She and her heusband would like to go home. They expressed  understanding about risk of disabolity or death.  We will still refer them to cardiology.      Final Clinical Impressions(s) / ED Diagnoses   Final diagnoses:  Chest pain, unspecified type    New Prescriptions Discharge Medication List as of 04/22/2016 12:57 PM       Matan Steen Randall An, MD 04/24/16 [id number]

## 2016-04-22 NOTE — ED Triage Notes (Signed)
Pt [facility], per report pt hit head on table x 2 days ago, pt denies LOC, pt reports bending down when this happened, pt MAE, pt does not take blood thinners, pt c/o HA onset today, A&O x4

## 2016-05-18 ENCOUNTER — Encounter: Payer: Self-pay | Admitting: Rehabilitative and Restorative Service Providers"

## 2016-05-18 ENCOUNTER — Telehealth: Payer: Self-pay | Admitting: *Deleted

## 2016-05-18 ENCOUNTER — Encounter: Payer: Self-pay | Admitting: Physician Assistant

## 2016-05-18 ENCOUNTER — Ambulatory Visit (INDEPENDENT_AMBULATORY_CARE_PROVIDER_SITE_OTHER): Payer: Medicare Other | Admitting: Physician Assistant

## 2016-05-18 VITALS — BP 144/70 | HR 58 | Ht 64.0 in | Wt 119.0 lb

## 2016-05-18 DIAGNOSIS — R079 Chest pain, unspecified: Secondary | ICD-10-CM | POA: Diagnosis not present

## 2016-05-18 DIAGNOSIS — R002 Palpitations: Secondary | ICD-10-CM | POA: Diagnosis not present

## 2016-05-18 DIAGNOSIS — I10 Essential (primary) hypertension: Secondary | ICD-10-CM | POA: Diagnosis not present

## 2016-05-18 DIAGNOSIS — D649 Anemia, unspecified: Secondary | ICD-10-CM | POA: Diagnosis not present

## 2016-05-18 DIAGNOSIS — E119 Type 2 diabetes mellitus without complications: Secondary | ICD-10-CM | POA: Insufficient documentation

## 2016-05-18 DIAGNOSIS — I2510 Atherosclerotic heart disease of native coronary artery without angina pectoris: Secondary | ICD-10-CM | POA: Diagnosis not present

## 2016-05-18 DIAGNOSIS — E785 Hyperlipidemia, unspecified: Secondary | ICD-10-CM | POA: Insufficient documentation

## 2016-05-18 HISTORY — DX: Atherosclerotic heart disease of native coronary artery without angina pectoris: I25.10

## 2016-05-18 LAB — TSH: TSH: 0.835 u[IU]/mL (ref 0.450–4.500)

## 2016-05-18 NOTE — Progress Notes (Signed)
Cardiology Office Note:    Date:  05/18/2016   ID:  Stephanie Henson, DOB Feb 15, 1946, MRN [id number]  PCP:  Lupita Raider, MD  Cardiologist:  Dr. Loman Brooklyn    Referring MD: Lupita Raider, MD   Chief Complaint  Patient presents with  . Palpitations  . Chest Pain    History of Present Illness:    Stephanie Henson is a 71 y.o. female with a hx of DM, HTN, HL, GERD, CKD 3, pulmonary nodules, fibromyalgia, goiter.  She was evaluated by Dr. Loman Brooklyn and Manson Passey, PA-C in 7/17 for bradycardia and fatigue.  Holter monitor showed Avg HR 63, NSR, possible ATach.  No further testing was recommended and prn follow up was planned.   Stephanie Henson was seen in the emergency room 04/22/16 for chest pain. Troponin was negative 1. Chest x-ray was unremarkable. ECG was nonacute. Symptoms were felt to be concerning by the ER physician. However, the patient insisted on leaving after her initial troponin came back negative. She returns for follow-up.  She is here alone today. She tells me that she has been fatigued with decreased exercise tolerance over the past year. She has also been diagnosed with fibromyalgia. She notes dyspnea with mild activities. However, this morning, she was able to walk for about 18 minutes without significant issues. She was told that she has borderline diabetes. Since that time, she has been getting up at night to urinate. She typically has rapid palpitations at that time. She denies rapid onset. She notes gradual resolution. She denies syncope. The day that she went to the emergency room, she had neck pain radiating to her left arm with some discomfort in her left chest. Her left chest symptoms were more related to palpitations than actual pain. She denies orthopnea, PND or edema.  Prior CV studies:   The following studies were reviewed today:  Holter 7/17 Average HR: 63 Minimum HR: 46 Maximum HR: 152 0% PVCs Zero atrial runs Sinus rhythm with sinus  tachycardia, possibly atrial tachycardia  Chest CT 9/16 FINDINGS: No pericardial effusion. The heart is normal in size. Atherosclerotic calcifications of the coronary vessels are noted. Thoracic aorta is normal in caliber. Visualized thyroid is unremarkable. There is a small hiatal hernia.  Carotid US 6/08 IMPRESSION:  1. No significant carotid stenosis.   2. Mild atherosclerotic plaque.   Past Medical History:  Diagnosis Date  . Anxiety   . Cervicalgia   . CKD (chronic kidney disease) stage 3, GFR 30-59 ml/min   . Coronary artery calcification seen on CAT scan 05/18/2016  . Diabetes mellitus (HCC)   . Environmental allergies   . FH: colonic polyps   . Fibromyalgia   . GERD (gastroesophageal reflux disease)   . Goiter   . Hematuria   . Hyperlipidemia   . Hypertension   . Osteoporosis   . Solitary pulmonary nodule     ;RUL - 7mm on CT 04/2014; CT 10/2014 resolution of nodule on RUL however persistant smaller nodules throughouts the lungs  . Weight loss     Past Surgical History:  Procedure Laterality Date  . BACK SURGERY    . NASAL SINUS SURGERY    . NECK SURGERY      Current Medications: Current Meds  Medication Sig  . ALPHAGAN P 0.1 % SOLN INSTILL 1 DROP IN BOTH EYES THREE TIMES A DAY  . aspirin 81 MG tablet Take 81 mg by mouth daily.  Marland Kitchen azelastine (ASTELIN) 0.1 % nasal spray USE  2 SPRAYS INTO EACH NOSTRIL TWICE A DAY  . BETIMOL 0.5 % ophthalmic solution Place 1 drop into both eyes daily. Each eye once per day  . Calcium Carbonate-Vitamin D (CALCIUM 500 + D PO) Take 1 tablet by mouth daily.  Marland Kitchen. CAPSAICIN HOT PATCH EX Apply 1 patch topically daily as needed (pain).  . cyclobenzaprine (FLEXERIL) 10 MG tablet Take 1 tablet (10 mg total) by mouth at bedtime.  Marland Kitchen. latanoprost (XALATAN) 0.005 % ophthalmic solution Place 1 drop into both eyes at bedtime.   Marland Kitchen. LORazepam (ATIVAN) 0.5 MG tablet Take 0.5 mg by mouth 2 (two) times daily.  . Omega-3 Fatty Acids (FISH OIL) 1000 MG  CAPS Take 2 capsules by mouth daily.   . rosuvastatin (CRESTOR) 10 MG tablet Take 10 mg by mouth daily.     Allergies:   Asa [aspirin]; Codeine; Sudafed [pseudoephedrine hcl]; and Tylenol [acetaminophen]   Social History   Social History  . Marital status: Married    Spouse name: N/A  . Number of children: N/A  . Years of education: N/A   Social History Main Topics  . Smoking status: Never Smoker  . Smokeless tobacco: Never Used  . Alcohol use No  . Drug use: No  . Sexual activity: Not Asked   Other Topics Concern  . None   Social History Narrative  . None     Family History  Problem Relation Age of Onset  . Heart disease Father   . Heart disease Brother   . Lung cancer Sister     smoked     ROS:   Please see the history of present illness.    Review of Systems  Cardiovascular: Positive for chest pain and dyspnea on exertion.  Musculoskeletal: Positive for myalgias.  Neurological: Positive for loss of balance.  Psychiatric/Behavioral: The patient is nervous/anxious.    All other systems reviewed and are negative.   EKGs/Labs/Other Test Reviewed:    EKG:  EKG is  ordered today.  The ekg ordered today demonstrates Sinus bradycardia, HR 58, normal axis, nonspecific ST-T wave changes, QTc 390 ms  Recent Labs: 04/22/2016: BUN 13; Creatinine, Ser 0.83; Hemoglobin 11.0; Platelets 210; Potassium 3.8; Sodium 141   Recent Lipid Panel No results found for: CHOL, TRIG, HDL, CHOLHDL, VLDL, LDLCALC, LDLDIRECT    Physical Exam:    VS:  BP (!) 144/70   Pulse (!) 58   Ht 5\' 4"  (1.626 m)   Wt 119 lb (54 kg)   BMI 20.43 kg/m     Wt Readings from Last 3 Encounters:  05/18/16 119 lb (54 kg)  02/25/16 122 lb (55.3 kg)  11/25/15 119 lb (54 kg)     Physical Exam  Constitutional: She is oriented to person, place, and time. She appears well-developed and well-nourished. No distress.  HENT:  Head: Normocephalic and atraumatic.  Eyes: No scleral icterus.  Neck: Normal  range of motion. No JVD present.  Cardiovascular: Normal rate, regular rhythm, S1 normal, S2 normal and normal heart sounds.   No murmur heard. Pulmonary/Chest: Breath sounds normal. She has no wheezes. She has no rhonchi. She has no rales.  Abdominal: Soft. There is no tenderness.  Musculoskeletal: She exhibits no edema.  Neurological: She is alert and oriented to person, place, and time.  Skin: Skin is warm and dry.  Psychiatric: She has a normal mood and affect.    ASSESSMENT:    1. Palpitations   2. Chest pain, unspecified type   3. Coronary  artery calcification seen on CAT scan   4. Essential hypertension   5. Anemia, unspecified type    PLAN:    In order of problems listed above:  1. Palpitations - She has had rapid palpitations for the past year. She was seen by Dr. Elberta Fortis last July for bradycardia. Holter monitor did not demonstrate significant bradycardia arrhythmias. However, she did have an episode of what appeared to be atrial tachycardia. I question if her rapid palpitations may be symptomatic atrial tachycardia. Her resting heart rate today is 58. Therefore, I do not think a beta blocker would be tolerated. I have recommended proceeding with an event monitor to further evaluate her palpitations. If she has documented symptomatic atrial tachycardia, question antiarrhythmic therapy versus ablative therapy. I will have her follow-up with Dr. Elberta Fortis after her monitor.  -  Event monitor x 30 days  -  Given fatigue, will check TSH.   -  FU with Dr. Loman Brooklyn 6-8 weeks.   2. Chest pain, unspecified type -  She has atypical chest pain.  But, she does have coronary Ca on prior CT and borderline DM.  If antiarrhythmic Rx needs to be considered in the future, we will need to r/o significant CAD.  -  Arrange ETT-Myoview  3. Coronary artery calcification seen on CAT scan - Obtain Myoview as noted.  Continue ASA, statin.  4. Essential hypertension - Borderline controlled.   Continue follow up with PCP.  5. Anemia, unspecified type - Hgb ~ 11.  This has been stable for 1 year.  FU with PCP for evaluation.  She admits to having a colonoscopy in the last 5 years.   Dispo:  Return in about 6 weeks (around 06/29/2016) for Follow up after testing with Dr. Elberta Fortis.   Medication Adjustments/Labs and Tests Ordered: Current medicines are reviewed at length with the patient today.  Concerns regarding medicines are outlined above.  Medication changes, Labs and Tests ordered today are outlined in the Patient Instructions noted below. Patient Instructions  Medication Instructions:  Your physician recommends that you continue on your current medications as directed. Please refer to the Current Medication list given to you today.  Labwork: TODAY TSH  Testing/Procedures: 1. Your physician has requested that you have en exercise stress myoview. For further information please visit [email or web address] Please follow instruction sheet, as given.  2. Your physician has recommended that you wear an 30 DAY event monitor. Event monitors are medical devices that record the heart's electrical activity. Doctors most often [place] these monitors to diagnose arrhythmias. Arrhythmias are problems with the speed or rhythm of the heartbeat. The monitor is a small, portable device. You can wear one while you do your normal daily activities. This is usually used to diagnose what is causing palpitations/syncope (passing out).  Follow-Up: DR. Elberta Fortis IN 6-8 WEEKS  Any Other Special Instructions Will Be Listed Below (If Applicable).  If you need a refill on your cardiac medications before your next appointment, please call your pharmacy.  Signed, Tereso Newcomer, PA-C  05/18/2016 5:09 PM    [facility] [address] [place], [place], [place]  [address] Phone: [phone]; Fax: [phone]

## 2016-05-18 NOTE — Telephone Encounter (Signed)
Both pt and her husband have been notified of lab results by phone with verbal understanding. Pt agreeable to plan of care to continue on current Tx plan.

## 2016-05-18 NOTE — Patient Instructions (Addendum)
Medication Instructions:  Your physician recommends that you continue on your current medications as directed. Please refer to the Current Medication list given to you today.  Labwork: TODAY TSH  Testing/Procedures: 1. Your physician has requested that you have en exercise stress myoview. For further information please visit [email or web address]. Please follow instruction sheet, as given.  2. Your physician has recommended that you wear an 30 DAY event monitor. Event monitors are medical devices that record the heart's electrical activity. Doctors most often us these monitors to diagnose arrhythmias. Arrhythmias are problems with the speed or rhythm of the heartbeat. The monitor is a small, portable device. You can wear one while you do your normal daily activities. This is usually used to diagnose what is causing palpitations/syncope (passing out).  Follow-Up: DR. Elberta FortisAMNITZ IN 6-8 WEEKS  Any Other Special Instructions Will Be Listed Below (If Applicable).  If you need a refill on your cardiac medications before your next appointment, please call your pharmacy.

## 2016-05-18 NOTE — Therapy (Signed)
[facility] [address] [place], [place], [address] Phone: [phone]   Fax:  [phone]  Patient Details  Name: Stephanie Henson MRN: [id number] Date of Birth: 05-27-1945 Referring Provider:  No ref. provider found  Encounter Date: last encounter 04/07/2016  PHYSICAL THERAPY DISCHARGE SUMMARY  Visits from Start of Care: 5  Current functional level related to goals / functional outcomes:      PT Long Term Goals - 04/07/16 0943      PT LONG TERM GOAL #1   Title The patient will be independent with HEP for ROM, flexibility, strengthening and postural stability.   Baseline Met on 03/31/2016   Time 4   Period Weeks   Status Achieved     PT LONG TERM GOAL #2   Title The patient will improve functional status survey score from 53% to > or equal to 65% to demonstrate improved overall perception of mobility.   Baseline Target date 04/03/2016   Time 4   Period Weeks   Status Deferred     PT LONG TERM GOAL #3   Title The patient will improve cervical rotation up to 58 deg bilaterally   Baseline R rotation=65 degrees; L rotation=52 degrees.   Time 4   Period Weeks   Status Partially Met     PT LONG TERM GOAL #4   Title The patient will improve bilateral UE strength from 4/5 shoulder flexion/abduction up to 5/5 for improved functional strength.   Baseline Patient continues with 4/5 bilateral UE strength with pain upon resistance in shoulders (L pain>R pain).   Time 4   Period Weeks   Status Not Met     PT LONG TERM GOAL #5   Title The patient will verbalize understanding of strategies for self mgmt of chronic pain.   Baseline Target date 04/03/2016   Time 4   Period Weeks   Status Achieved        Remaining deficits: Chronic neck pain Abnormal posture   Education / Equipment: HEP, self mgmt of chronic pain.  Plan: Patient agrees to discharge.  Patient goals were partially met. Patient is being discharged  due to meeting the stated rehab goals.  ?????        Thank you for the referral of this patient. Rudell Cobb, MPT   Uzziah Rigg 05/18/2016, 8:53 AM  [facility] [address] [place], [place], [address] Phone: [phone]   Fax:  [phone]

## 2016-05-18 NOTE — Telephone Encounter (Signed)
-----   Message from Beatrice LecherScott T Weaver, [place]PA-C sent at 05/18/2016  5:43 PM EDT ----- Please call the patient TSH normal Continue with current treatment plan. Tereso NewcomerScott Weaver, PA-C   05/18/2016 5:43 PM

## 2016-05-26 ENCOUNTER — Telehealth (HOSPITAL_COMMUNITY): Payer: Self-pay | Admitting: *Deleted

## 2016-05-26 ENCOUNTER — Telehealth: Payer: Self-pay | Admitting: Cardiology

## 2016-05-26 NOTE — Telephone Encounter (Signed)
Patient given detailed instructions per Myocardial Perfusion Study Information Sheet for the test on  05/31/16. Patient notified to arrive 15 minutes early and that it is imperative to arrive on time for appointment to keep from having the test rescheduled.  If you need to cancel or reschedule your appointment, please call the office within 24 hours of your appointment. Failure to do so may result in a cancellation of your appointment, and a $50 no show fee. Patient verbalized understanding. Maleny Candy Jacqueline    

## 2016-05-26 NOTE — Telephone Encounter (Signed)
New message   Pt is calling stating she does not want to wear the heart monitor for a month. She said she would wear it for a week and that is it.

## 2016-05-26 NOTE — Telephone Encounter (Signed)
I would recommend she wear the monitor at least until we have been able to document 3-4 of the episodes of rapid palpitations that she reported to me at her last office visit. Tereso Newcomer, PA-C   05/26/2016 2:38 PM

## 2016-05-26 NOTE — Telephone Encounter (Signed)
I s/w pt in regards to wearing the heart monitor. I advised pt per recommendation from [place] W. PA to try and at least wear the monitor until we have been able to document 3-4 episodes of rapid palpitations that pt had reported at her last ov with [place] W.PA. Pt is agreeable to this plan of care and she will try to wear the monitor until we have we are looking for. I explained to her that we can't just tell our heart to do something and it will do it, so wearing the monitor as long as possible will help [place] be able to try and catch what is happening when she describes her heart rate going fast. Pt thanked me for my tine and explainng this to her.

## 2016-05-26 NOTE — Telephone Encounter (Signed)
Will route to Tereso Newcomer, [place] for further advice.

## 2016-05-27 DIAGNOSIS — Z7982 Long term (current) use of aspirin: Secondary | ICD-10-CM | POA: Insufficient documentation

## 2016-05-27 DIAGNOSIS — N183 Chronic kidney disease, stage 3 (moderate): Secondary | ICD-10-CM | POA: Diagnosis not present

## 2016-05-27 DIAGNOSIS — I129 Hypertensive chronic kidney disease with stage 1 through stage 4 chronic kidney disease, or unspecified chronic kidney disease: Secondary | ICD-10-CM | POA: Diagnosis not present

## 2016-05-27 DIAGNOSIS — M79671 Pain in right foot: Secondary | ICD-10-CM | POA: Insufficient documentation

## 2016-05-27 DIAGNOSIS — E1122 Type 2 diabetes mellitus with diabetic chronic kidney disease: Secondary | ICD-10-CM | POA: Diagnosis not present

## 2016-05-28 ENCOUNTER — Encounter (HOSPITAL_COMMUNITY): Payer: Self-pay

## 2016-05-28 ENCOUNTER — Emergency Department (HOSPITAL_COMMUNITY)
Admission: EM | Admit: 2016-05-28 | Discharge: 2016-05-28 | Disposition: A | Discharge status: Home / Self Care | Payer: Medicare Other | Attending: Emergency Medicine | Admitting: Emergency Medicine

## 2016-05-28 ENCOUNTER — Emergency Department (HOSPITAL_COMMUNITY): Payer: Medicare Other

## 2016-05-28 DIAGNOSIS — M79671 Pain in right foot: Secondary | ICD-10-CM

## 2016-05-28 LAB — BASIC METABOLIC PANEL
ANION GAP: 10 (ref 5–15)
BUN: 20 mg/dL (ref 6–20)
CO2: 25 mmol/L (ref 22–32)
Calcium: 9.9 mg/dL (ref 8.9–10.3)
Chloride: 107 mmol/L (ref 101–111)
Creatinine, Ser: 0.9 mg/dL (ref 0.44–1.00)
GFR calc Af Amer: >60 mL/min (ref >60)
GFR calc non Af Amer: >60 mL/min (ref >60)
GLUCOSE: 119 mg/dL — AB (ref 65–99)
POTASSIUM: 3.7 mmol/L (ref 3.5–5.1)
Sodium: 142 mmol/L (ref 135–145)

## 2016-05-28 LAB — CBC WITH DIFFERENTIAL/PLATELET
BASOS PCT: 0 %
Basophils Absolute: 0 10*3/uL (ref 0.0–0.1)
EOS PCT: 2 %
Eosinophils Absolute: 0.2 10*3/uL (ref 0.0–0.7)
HEMATOCRIT: 39 % (ref 36.0–46.0)
Hemoglobin: 12 g/dL (ref 12.0–15.0)
LYMPHS PCT: 17 %
Lymphs Abs: 1.7 10*3/uL (ref 0.7–4.0)
MCH: 23 pg — AB (ref 26.0–34.0)
MCHC: 30.8 g/dL (ref 30.0–36.0)
MCV: 74.9 fL — AB (ref 78.0–100.0)
MONOS PCT: 9 %
Monocytes Absolute: 0.9 10*3/uL (ref 0.1–1.0)
NEUTROS ABS: 7.4 10*3/uL (ref 1.7–7.7)
NEUTROS PCT: 72 %
Platelets: 189 10*3/uL (ref 150–400)
RBC: 5.21 MIL/uL — ABNORMAL HIGH (ref 3.87–5.11)
RDW: 15.9 % — ABNORMAL HIGH (ref 11.5–15.5)
WBC: 10.2 10*3/uL (ref 4.0–10.5)

## 2016-05-28 LAB — URIC ACID: Uric Acid, Serum: 5.1 mg/dL (ref 2.3–6.6)

## 2016-05-28 MED ORDER — PREDNISONE 20 MG PO TABS
60.0000 mg | ORAL_TABLET | Freq: Once | ORAL | Status: AC
  Administered 2016-05-28: 60 mg via ORAL
  Filled 2016-05-28: qty 3

## 2016-05-28 MED ORDER — TRAMADOL HCL 50 MG PO TABS: 50.0000 mg | ORAL_TABLET | Freq: Four times a day (QID) | ORAL | 0 refills | Status: DC | PRN

## 2016-05-28 MED ORDER — INDOMETHACIN 25 MG PO CAPS
25.0000 mg | ORAL_CAPSULE | Freq: Once | ORAL | Status: AC
Start: 2016-05-28 — End: 2016-05-28
  Administered 2016-05-28: 25 mg via ORAL
  Filled 2016-05-28: qty 1

## 2016-05-28 MED ORDER — OXYCODONE HCL 5 MG PO TABS
5.0000 mg | ORAL_TABLET | Freq: Once | ORAL | Status: AC
  Administered 2016-05-28: 5 mg via ORAL
  Filled 2016-05-28: qty 1

## 2016-05-28 MED ORDER — PREDNISONE 20 MG PO TABS: 40.0000 mg | ORAL_TABLET | Freq: Every day | ORAL | 0 refills | Status: AC

## 2016-05-28 NOTE — ED Provider Notes (Signed)
Medical screening examination/treatment/procedure(s) were conducted as a shared visit with non-physician practitioner(s) and myself.  I personally evaluated the patient during the encounter.   EKG Interpretation None     71 year old female here with acute onset of right foot pain was started at her first MTP has now spread to her fifth MTP. On physical exam she has no sign of basilar compromise. Suspect that this isn't arthritic condition and will treat with prednisone and return precautions given.   Lorre Nick, MD 05/28/16 [phone]

## 2016-05-28 NOTE — ED Triage Notes (Signed)
Pt c/o 10/10 pain that started in her right toe and has progressed to her right foot. Pt denies hx of gout. Pt denies traumatic injury. Pt reports hx of diabetes. Pt A+OX4, in WC, speaking in complete sentences.

## 2016-05-28 NOTE — ED Provider Notes (Signed)
[facility]-EMERGENCY DEPT Provider Note   CSN: [id number] Arrival date & time: 05/27/16  2358   By signing my name below, I, Teofilo Pod, attest that this documentation has been prepared under the direction and in the presence of Elpidio Anis, PA-C. Electronically Signed: Teofilo Pod, ED Scribe. 05/28/2016. 1:49 AM.   History   Chief Complaint Chief Complaint  Patient presents with  . Foot Pain    Right    The history is provided by the patient. No language interpreter was used.   HPI Comments:  Stephanie Henson is a 71 y.o. female with PMHx of DM who presents to the Emergency Department complaining of constant right foot pain since yesterday. She states that the pain started in her right great toe and now radiates through the entire foot. She rates the pain at 10/10 and describes the pain as "stinging." She notes that she is unable to move the right toes, and states that her right toes have gotten significantly red. Reports associated heart palpitations. She denies any injury or trauma. She reports a known allergy to aspirin and codeine. No alleviating factors noted. Pt denies other associated symptoms.   Past Medical History:  Diagnosis Date  . Anxiety   . Cervicalgia   . CKD (chronic kidney disease) stage 3, GFR 30-59 ml/min   . Coronary artery calcification seen on CAT scan 05/18/2016  . Diabetes mellitus (HCC)   . Environmental allergies   . FH: colonic polyps   . Fibromyalgia   . GERD (gastroesophageal reflux disease)   . Goiter   . Hematuria   . Hyperlipidemia   . Hypertension   . Osteoporosis   . Solitary pulmonary nodule     ;RUL - 7mm on CT 04/2014; CT 10/2014 resolution of nodule on RUL however persistant smaller nodules throughouts the lungs  . Weight loss     Patient Active Problem List   Diagnosis Date Noted  . Coronary artery calcification seen on CAT scan 05/18/2016  . Essential hypertension 05/18/2016  . Type 2 diabetes mellitus without complication  (HCC) 05/18/2016  . HLD (hyperlipidemia) 05/18/2016  . Plantar fasciitis 03/25/2016  . Anemia 10/12/2015  . Glaucoma 09/16/2015  . Pulmonary nodules 12/26/2012  . Chronic rhinitis 12/26/2012    Past Surgical History:  Procedure Laterality Date  . BACK SURGERY    . NASAL SINUS SURGERY    . NECK SURGERY      OB History    No data available       Home Medications    Prior to Admission medications   Medication Sig Start Date End Date Taking? Authorizing Provider  ALPHAGAN P 0.1 % SOLN INSTILL 1 DROP IN BOTH EYES THREE TIMES A DAY 12/24/14   Historical Provider, MD  aspirin 81 MG tablet Take 81 mg by mouth daily.    Historical Provider, MD  azelastine (ASTELIN) 0.1 % nasal spray USE 2 SPRAYS INTO EACH NOSTRIL TWICE A DAY 05/20/15   Historical Provider, MD  BETIMOL 0.5 % ophthalmic solution Place 1 drop into both eyes daily. Each eye once per day 11/06/12   Historical Provider, MD  Calcium Carbonate-Vitamin D (CALCIUM 500 + D PO) Take 1 tablet by mouth daily.    Historical Provider, MD  CAPSAICIN HOT PATCH EX Apply 1 patch topically daily as needed (pain).    Historical Provider, MD  cyclobenzaprine (FLEXERIL) 10 MG tablet Take 1 tablet (10 mg total) by mouth at bedtime. 12/04/15   Doyle Askew, FNP  latanoprost (XALATAN) 0.005 % ophthalmic solution Place 1 drop into both eyes at bedtime.  12/22/12   Historical Provider, MD  LORazepam (ATIVAN) 0.5 MG tablet Take 0.5 mg by mouth 2 (two) times daily.    Historical Provider, MD  Omega-3 Fatty Acids (FISH OIL) 1000 MG CAPS Take 2 capsules by mouth daily.     Historical Provider, MD  rosuvastatin (CRESTOR) 10 MG tablet Take 10 mg by mouth daily. 02/04/16   Historical Provider, MD    Family History Family History  Problem Relation Age of Onset  . Heart disease Father   . Heart disease Brother   . Lung cancer Sister     smoked    Social History Social History  Substance Use Topics  . Smoking status: Never Smoker  .  Smokeless tobacco: Never Used  . Alcohol use No     Allergies   Asa [aspirin]; Codeine; Sudafed [pseudoephedrine hcl]; and Tylenol [acetaminophen]   Review of Systems Review of Systems  Respiratory: Negative for shortness of breath.   Cardiovascular: Positive for palpitations.  Musculoskeletal: Positive for arthralgias.  Skin: Positive for color change.  Neurological: Negative for numbness.  All other systems reviewed and are negative.    Physical Exam Updated Vital Signs BP (!) 162/111 (BP Location: Right Arm)   Pulse 76   Temp 98 F (36.7 C) (Oral)   Resp 18   Wt 116 lb (52.6 kg)   SpO2 100%   BMI 19.91 kg/m   Physical Exam  Constitutional: She appears well-developed and well-nourished. No distress.  HENT:  Head: Normocephalic and atraumatic.  Eyes: Conjunctivae are normal.  Cardiovascular: Normal rate.   Pulmonary/Chest: Effort normal.  Abdominal: She exhibits no distension.  Musculoskeletal:  Distal erythema in toes of right foot, greatest in the great toe. Tenderness through all digits and MTP's. No wound, drainage or rash.   Neurological: She is alert.  Skin: Skin is warm and dry.  Psychiatric: She has a normal mood and affect.  Nursing note and vitals reviewed.    ED Treatments / Results  DIAGNOSTIC STUDIES:  Oxygen Saturation is 100% on RA, normal by my interpretation.    COORDINATION OF CARE:  1:49 AM Discussed treatment plan with pt at bedside and pt agreed to plan.   Labs (all labs ordered are listed, but only abnormal results are displayed) Labs Reviewed - No data to display  EKG  EKG Interpretation None       Radiology No results found.  Procedures Procedures (including critical care time)  Medications Ordered in ED Medications - No data to display   Initial Impression / Assessment and Plan / ED Course  I have reviewed the triage vital signs and the nursing notes.  Pertinent labs & imaging results that were available  during my care of the patient were reviewed by me and considered in my medical decision making (see chart for details).     Patient with atraumatic right foot pain, including MTP joints and toes. No evidence of infection. VSS. Percocet with some relief, Indomethacin with greater relief.   She is examined by Dr. Freida Busman. Advised prednisone, Tramadol and PCP follow up. Return precautions discussed.   Final Clinical Impressions(s) / ED Diagnoses   Final diagnoses:  None   1. Right foot pain  New Prescriptions New Prescriptions   No medications on file  I personally performed the services described in this documentation, which was scribed in my presence. The recorded information has been reviewed and is  accurate.    Elpidio Anis, PA-C 06/04/16 0501

## 2016-05-31 ENCOUNTER — Encounter (HOSPITAL_COMMUNITY): Payer: Medicare Other

## 2016-06-21 ENCOUNTER — Other Ambulatory Visit: Payer: Self-pay

## 2016-06-21 NOTE — Telephone Encounter (Signed)
11/2015 last ov and refill 

## 2016-07-06 ENCOUNTER — Institutional Professional Consult (permissible substitution): Payer: Medicare Other | Admitting: Cardiology

## 2016-07-08 ENCOUNTER — Telehealth: Payer: Self-pay | Admitting: Cardiology

## 2016-07-08 NOTE — Telephone Encounter (Signed)
Patient has declined monitor and stress test appt. I have removed the orders from the system.   Lamar LaundrySonya

## 2016-07-08 NOTE — Telephone Encounter (Signed)
[address] Stephanie Henson Fort Madison[place], [place]    07/08/2016 4:59 PM

## 2016-08-31 ENCOUNTER — Ambulatory Visit (INDEPENDENT_AMBULATORY_CARE_PROVIDER_SITE_OTHER): Payer: Medicare Other | Admitting: Family Medicine

## 2016-08-31 ENCOUNTER — Encounter: Payer: Self-pay | Admitting: Family Medicine

## 2016-08-31 VITALS — BP 190/87 | HR 63 | Temp 98.1°F | Resp 16 | Ht 64.0 in | Wt 116.8 lb

## 2016-08-31 DIAGNOSIS — R51 Headache: Secondary | ICD-10-CM

## 2016-08-31 DIAGNOSIS — Z87898 Personal history of other specified conditions: Secondary | ICD-10-CM | POA: Diagnosis not present

## 2016-08-31 DIAGNOSIS — R519 Headache, unspecified: Secondary | ICD-10-CM

## 2016-08-31 DIAGNOSIS — I10 Essential (primary) hypertension: Secondary | ICD-10-CM | POA: Diagnosis not present

## 2016-08-31 DIAGNOSIS — R7303 Prediabetes: Secondary | ICD-10-CM | POA: Diagnosis not present

## 2016-08-31 DIAGNOSIS — D509 Iron deficiency anemia, unspecified: Secondary | ICD-10-CM | POA: Diagnosis not present

## 2016-08-31 DIAGNOSIS — R42 Dizziness and giddiness: Secondary | ICD-10-CM | POA: Diagnosis not present

## 2016-08-31 LAB — POCT CBC
GRANULOCYTE PERCENT: 69.7 % (ref 37–80)
HCT, POC: 34 % — AB (ref 37.7–47.9)
Hemoglobin: 10.8 g/dL — AB (ref 12.2–16.2)
Lymph, poc: 1.2 (ref 0.6–3.4)
MCH: 23.1 pg — AB (ref 27–31.2)
MCHC: 31.8 g/dL (ref 31.8–35.4)
MCV: 72.6 fL — AB (ref 80–97)
MID (cbc): 0.4 (ref 0–0.9)
MPV: 8.7 fL (ref 0–99.8)
PLATELET COUNT, POC: 235 10*3/uL (ref 142–424)
POC Granulocyte: 3.8 (ref 2–6.9)
POC LYMPH %: 22.9 % (ref 10–50)
POC MID %: 7.4 %M (ref 0–12)
RBC: 4.68 M/uL (ref 4.04–5.48)
RDW, POC: 16.8 %
WBC: 5.4 10*3/uL (ref 4.6–10.2)

## 2016-08-31 LAB — COMPREHENSIVE METABOLIC PANEL
A/G RATIO: 2.4 — AB (ref 1.2–2.2)
ALK PHOS: 97 IU/L (ref 39–117)
ALT: 16 IU/L (ref 0–32)
AST: 21 IU/L (ref 0–40)
Albumin: 4.6 g/dL (ref 3.5–4.8)
BUN/Creatinine Ratio: 19 (ref 12–28)
BUN: 16 mg/dL (ref 8–27)
CHLORIDE: 102 mmol/L (ref 96–106)
CO2: 22 mmol/L (ref 20–29)
Calcium: 9.3 mg/dL (ref 8.7–10.3)
Creatinine, Ser: 0.85 mg/dL (ref 0.57–1.00)
GFR calc Af Amer: 80 mL/min/{1.73_m2} (ref >59)
GFR calc non Af Amer: 69 mL/min/{1.73_m2} (ref >59)
GLUCOSE: 113 mg/dL — AB (ref 65–99)
Globulin, Total: 1.9 g/dL (ref 1.5–4.5)
POTASSIUM: 4.3 mmol/L (ref 3.5–5.2)
Sodium: 141 mmol/L (ref 134–144)
Total Protein: 6.5 g/dL (ref 6.0–8.5)

## 2016-08-31 LAB — GLUCOSE, POCT (MANUAL RESULT ENTRY): POC GLUCOSE: 109 mg/dL — AB (ref 70–99)

## 2016-08-31 LAB — POCT GLYCOSYLATED HEMOGLOBIN (HGB A1C): HEMOGLOBIN A1C: 6.5

## 2016-08-31 MED ORDER — MECLIZINE HCL 25 MG PO TABS: ORAL_TABLET | ORAL | 0 refills | Status: DC

## 2016-08-31 MED ORDER — LOSARTAN POTASSIUM 50 MG PO TABS: 50.0000 mg | ORAL_TABLET | Freq: Every day | ORAL | 3 refills | Status: DC

## 2016-08-31 NOTE — Progress Notes (Addendum)
Patient ID: Stephanie Henson, female    DOB: 1945/04/20  Age: 71 y.o. MRN: [id number]  Chief Complaint  Patient presents with  . Dizziness    Began Monday  . Headache  . Hypertension    Subjective:   71 year old lady who is here acutely with feeling bad since Sunday night. She is getting ready for bed side the night when she developed dizziness and weakness. Monday morning she felt a little bit better, but as the day went on she felt worse. She felt staggery and had to hold on to things. She did very little except for some minimal housework. She didn't feel right in her stomach that was not frankly nauseated or vomiting. She did not have any chest pains or shortness of breath, but she has been having palpitations lately. She had a frontal headache which is persisted. She felt that way Monday and again Tuesday and this morning she felt a little bit better when she got up. She ate some oatmeal for breakfast. Then it hit her again and she got feeling weak and dizzy. She came on in here to get checked. She contacted her PCP office and couldn't get up appointment this month. She takes medicine for anxiety (lorazepam) and is on medicine for her lipids. Other than eyedrops for dry eyes she is not on any other major illnesses. She has a history of being prediabetic and feels like that has contributed to her urinating at night frequently over the last couple of years.  Current allergies, medications, problem list, past/family and social histories reviewed.  Objective:  BP (!) 190/87   Pulse 63   Temp 98.1 F (36.7 C) (Oral)   Resp 16   Ht 5\' 4"  (1.626 m)   Wt 116 lb 12.8 oz (53 kg)   SpO2 98%   BMI 20.05 kg/m   Alert and oriented and she sounds a little weak in her speech. She has dark glasses on because the light bothers her eyes. TMs normal. Eyes PERRLA. EOMs intact. Throat clear and mucous membranes moist. Neck supple without nodes or thyromegaly. No carotid bruits. Chest clear. Heart regular  without murmurs. Abdomen soft. Some mild nonspecific tenderness. Extremities unremarkable. No edema. Straight leg negative. Her Romberg is a little unsteady and her gait is very unsteady. She had to grab onto something just walking the 6 right feet across a room to the wall.  Noted was her elevated blood pressure when she came in, and on repeat it was still elevated at 190/87. In reviewing her records it was a little high last time that she was recorded in the emergency room a few months ago, but otherwise usually is pretty good. We do not have the records available from her primary care.  Assessment & Plan:   Assessment: 1. Dizziness and giddiness   2. Nonintractable headache, unspecified chronicity pattern, unspecified headache type   3. Pre-diabetes   4. History of palpitations   5. Systolic hypertension       Plan: Will check some basic labs and then try to make decisions.  Orders Placed This Encounter  Procedures  . Comprehensive metabolic panel  . POCT CBC  . POCT glucose (manual entry)  . POCT glycosylated hemoglobin (Hb A1C)  . EKG 12-Lead    Meds ordered this encounter  Medications  . vitamin B-12 (CYANOCOBALAMIN) 1000 MCG tablet    Sig: Take 1,000 mcg by mouth daily.   EKG normal  Results for orders placed or performed in visit  on 08/31/16  POCT CBC  Result Value Ref Range   WBC 5.4 4.6 - 10.2 K/uL   Lymph, poc 1.2 0.6 - 3.4   POC LYMPH PERCENT 22.9 10 - 50 %L   MID (cbc) 0.4 0 - 0.9   POC MID % 7.4 0 - 12 %M   POC Granulocyte 3.8 2 - 6.9   Granulocyte percent 69.7 37 - 80 %G   RBC 4.68 4.04 - 5.48 M/uL   Hemoglobin 10.8 (A) 12.2 - 16.2 g/dL   HCT, POC 16.134.0 (A) 09.637.7 - 47.9 %   MCV 72.6 (A) 80 - 97 fL   MCH, POC 23.1 (A) 27 - 31.2 pg   MCHC 31.8 31.8 - 35.4 g/dL   RDW, POC 04.516.8 %   Platelet Count, POC 235 142 - 424 K/uL   MPV 8.7 0 - 99.8 fL  POCT glucose (manual entry)  Result Value Ref Range   POC Glucose 109 (A) 70 - 99 mg/dl  POCT glycosylated  hemoglobin (Hb A1C)  Result Value Ref Range   Hemoglobin A1C 6.5          Patient Instructions   Follow-up in 2-4 weeks with your primary care to check on your blood pressure  Drink plenty of fluids  Take meclizine 25 mg 1/2-1 pill every 6 or 8 hours if needed for dizziness.  Take acetaminophen (Tylenol) 500 mg 2 pills 3 times daily if needed for headache.  Try to get plenty of rest the next couple of days. If you're getting worse at any time, please return for a recheck or go to your primary care or to an emergency room if necessary.  If not feeling better in the next couple of days, for a recheck.  Your blood test shows that you may be an early diabetic. The actual sugar is not bad today, but another test which tells which your average blood sugars have been doing over the past 3 months, the hemoglobin A1c, is 6.5. It should be less than 5.9 to be normal, above that is borderline, and 6.5 and above usually is considered to be diabetic. Discussed this with your primary care.    IF you received an x-ray today, you will receive an invoice from [facility]. Please contact [facility] at [phone] with questions or concerns regarding your invoice.   IF you received labwork today, you will receive an invoice from [facility]. Please contact [facility] at [phone] with questions or concerns regarding your invoice.   Our billing staff will not be able to assist you with questions regarding bills from these companies.  You will be contacted with the lab results as soon as they are available. The fastest way to get your results is to activate your My Chart account. Instructions are located on the last page of this paperwork. If you have not heard from us regarding the results in 2 weeks, please contact this office.      In reviewing my day and noted that the patient was anemic again. She has been off and on in the rise and fall of her hemoglobin. I called her back and  told her to make sure that when she saw Dr. back that she got this rechecked.  Return if symptoms worsen or fail to improve.   HOPPER,DAVID, MD 08/31/2016

## 2016-08-31 NOTE — Patient Instructions (Addendum)
Follow-up in 2-4 weeks with your primary care to check on your blood pressure  Drink plenty of fluids  Take meclizine 25 mg 1/2-1 pill every 6 or 8 hours if needed for dizziness.  Take acetaminophen (Tylenol) 500 mg 2 pills 3 times daily if needed for headache.  Try to get plenty of rest the next couple of days. If you're getting worse at any time, please return for a recheck or go to your primary care or to an emergency room if necessary.  If not feeling better in the next couple of days, for a recheck.  Your blood test shows that you may be an early diabetic. The actual sugar is not bad today, but another test which tells which your average blood sugars have been doing over the past 3 months, the hemoglobin A1c, is 6.5. It should be less than 5.9 to be normal, above that is borderline, and 6.5 and above usually is considered to be diabetic. Discussed this with your primary care.    IF you received an x-ray today, you will receive an invoice from [facility]. Please contact [facility] at [phone] with questions or concerns regarding your invoice.   IF you received labwork today, you will receive an invoice from [facility]. Please contact [facility] at [phone] with questions or concerns regarding your invoice.   Our billing staff will not be able to assist you with questions regarding bills from these companies.  You will be contacted with the lab results as soon as they are available. The fastest way to get your results is to activate your My Chart account. Instructions are located on the last page of this paperwork. If you have not heard from us regarding the results in 2 weeks, please contact this office.

## 2016-09-01 ENCOUNTER — Encounter: Payer: Self-pay | Admitting: Radiology

## 2016-09-05 ENCOUNTER — Telehealth: Payer: Self-pay | Admitting: Physician Assistant

## 2016-09-05 ENCOUNTER — Telehealth (HOSPITAL_COMMUNITY): Payer: Self-pay | Admitting: *Deleted

## 2016-09-05 DIAGNOSIS — R002 Palpitations: Secondary | ICD-10-CM

## 2016-09-05 DIAGNOSIS — R079 Chest pain, unspecified: Secondary | ICD-10-CM

## 2016-09-05 NOTE — Telephone Encounter (Signed)
New message       Pt c/o of Chest Pain: STAT if CP now or developed within 24 hours  1. Are you having CP right now?  yes 2. Are you experiencing any other symptoms (ex. SOB, nausea, vomiting, sweating)?  Little sob, terrible headache 3. How long have you been experiencing CP? Started at 6:30 am 4. Is your CP continuous or coming and going?  Continuous----beginning to slow down  5. Have you taken Nitroglycerin?  ?no

## 2016-09-05 NOTE — Telephone Encounter (Signed)
Patient calling complaining of racing heart, chest pain, headache, elevated BP, and SOB with activity. Patient stated she woke up with H/A and CP. Patient stated she just feels awful. Patient stated since she has been up that her chest pain has eased off. Asked patient if she took her BP this morning. Patient stated she did but she does not remember what it was. Patient stated it was high last week at her office visit with a PCP. At that time BP 190/87 and HR 63, Patient stated Dr. Alwyn RenHopper started her on Losartan for her BP. Patient wants to see Dr. Elberta Fortisamnitz and not a PA. Will discuss with DOD.  Dr. Anne FuSkains, DOD, recommended patient to follow-up with her testing that were to be completed after her office visit with Tereso NewcomerScott Weaver PA on 05/18/16. He also recommend patient to follow up with her PCP. Patient had an appointment to have a myoview, monitor put on, and f/u with Dr. Elberta Fortisamnitz back in April, which patient canceled. Informed patient that our office will [facility] her [facility]. Patient verbalized understanding and will call her PCP.   Patient coming in tomorrow for myoveiw.

## 2016-09-05 NOTE — Telephone Encounter (Signed)
Left message on voicemail per DPR in reference to upcoming appointment scheduled on 09/06/16 with detailed instructions given per Myocardial Perfusion Study Information Sheet for the test. LM to arrive 15 minutes early, and that it is imperative to arrive on time for appointment to keep from having the test rescheduled. If you need to cancel or reschedule your appointment, please call the office within 24 hours of your appointment. Failure to do so may result in a cancellation of your appointment, and a $50 no show fee. Phone number given for call back for any questions. Eyal Greenhaw Jacqueline    

## 2016-09-06 ENCOUNTER — Telehealth: Payer: Self-pay | Admitting: *Deleted

## 2016-09-06 ENCOUNTER — Ambulatory Visit (HOSPITAL_COMMUNITY): Payer: Medicare Other | Attending: Cardiology

## 2016-09-06 ENCOUNTER — Encounter: Payer: Self-pay | Admitting: Physician Assistant

## 2016-09-06 DIAGNOSIS — Z8249 Family history of ischemic heart disease and other diseases of the circulatory system: Secondary | ICD-10-CM | POA: Diagnosis not present

## 2016-09-06 DIAGNOSIS — R002 Palpitations: Secondary | ICD-10-CM | POA: Insufficient documentation

## 2016-09-06 DIAGNOSIS — R0609 Other forms of dyspnea: Secondary | ICD-10-CM | POA: Insufficient documentation

## 2016-09-06 DIAGNOSIS — R079 Chest pain, unspecified: Secondary | ICD-10-CM | POA: Insufficient documentation

## 2016-09-06 DIAGNOSIS — R5383 Other fatigue: Secondary | ICD-10-CM | POA: Insufficient documentation

## 2016-09-06 DIAGNOSIS — I10 Essential (primary) hypertension: Secondary | ICD-10-CM | POA: Insufficient documentation

## 2016-09-06 LAB — MYOCARDIAL PERFUSION IMAGING
CHL CUP MPHR: 149 {beats}/min
CHL CUP NUCLEAR SDS: 0
CHL CUP RESTING HR STRESS: 61 {beats}/min
CSEPED: 4 min
CSEPEW: 6.7 METS
Exercise duration (sec): 45 s
LHR: 0.27
LV sys vol: 21 mL
LVDIAVOL: 63 mL (ref 46–106)
NUC STRESS TID: 0.84
Peak HR: 139 {beats}/min
Percent HR: 93 %
SRS: 6
SSS: 6

## 2016-09-06 MED ORDER — TECHNETIUM TC 99M TETROFOSMIN IV KIT
10.1000 | PACK | Freq: Once | INTRAVENOUS | Status: AC | PRN
  Administered 2016-09-06: 10.1 via INTRAVENOUS
  Filled 2016-09-06: qty 11

## 2016-09-06 MED ORDER — TECHNETIUM TC 99M TETROFOSMIN IV KIT
33.0000 | PACK | Freq: Once | INTRAVENOUS | Status: AC | PRN
  Administered 2016-09-06: 33 via INTRAVENOUS
  Filled 2016-09-06: qty 33

## 2016-09-06 NOTE — Telephone Encounter (Signed)
Pt has been notified of normal Myoview results by phone with verbal understanding. Pt aware to keep appt for the event monitor 09/14/16. Pt aware to continue on her current medications at this time. Pt ok with results to be faxed to Dr. Clelia CroftShaw.

## 2016-09-06 NOTE — Telephone Encounter (Signed)
-----   Message from Beatrice LecherScott T Weaver, [place]-C sent at 09/06/2016 12:21 PM EDT ----- Please call the patient. The stress test is normal.   Continue current management.    Please fax a copy of this study result to her PCP:  Lupita RaiderShaw, Kimberlee, MD  Thanks! Tereso NewcomerScott Weaver, PA-C    09/06/2016 12:20 PM

## 2016-09-13 ENCOUNTER — Other Ambulatory Visit: Payer: Self-pay | Admitting: Family Medicine

## 2016-09-13 ENCOUNTER — Other Ambulatory Visit: Payer: Medicare Other

## 2016-09-13 DIAGNOSIS — R1084 Generalized abdominal pain: Secondary | ICD-10-CM

## 2016-09-13 LAB — CBC AND DIFFERENTIAL
HEMATOCRIT: 36 (ref 36–46)
HEMOGLOBIN: 11.2 — AB (ref 12.0–16.0)
Platelets: 239 (ref 150–399)
WBC: 6.4

## 2016-09-13 LAB — BASIC METABOLIC PANEL
BUN: 19 (ref 4–21)
Creatinine: 0.8 (ref 0.5–1.1)
GLUCOSE: 120
Potassium: 4.5 (ref 3.4–5.3)
SODIUM: 142 (ref 137–147)

## 2016-09-13 LAB — LIPID PANEL
Cholesterol: 170 (ref 0–200)
HDL: 50 (ref 35–70)
LDL CALC: 95
TRIGLYCERIDES: 127 (ref 40–160)

## 2016-09-13 LAB — HEPATIC FUNCTION PANEL
ALK PHOS: 85 (ref 25–125)
ALT: 16 (ref 7–35)
AST: 21 (ref 13–35)
Bilirubin, Total: 0.4

## 2016-09-14 ENCOUNTER — Ambulatory Visit: Payer: Medicare Other

## 2016-09-14 ENCOUNTER — Ambulatory Visit
Admission: RE | Admit: 2016-09-14 | Discharge: 2016-09-14 | Disposition: A | Discharge status: Home / Self Care | Payer: Medicare Other | Source: Ambulatory Visit | Attending: Family Medicine | Admitting: Family Medicine

## 2016-09-14 DIAGNOSIS — R1084 Generalized abdominal pain: Secondary | ICD-10-CM

## 2016-09-21 ENCOUNTER — Other Ambulatory Visit (HOSPITAL_COMMUNITY): Payer: Self-pay | Admitting: *Deleted

## 2016-09-21 ENCOUNTER — Telehealth: Payer: Self-pay | Admitting: Family Medicine

## 2016-09-21 NOTE — Telephone Encounter (Signed)
Pt states she has questions about her meds she would like to go over.  Contact number [phone].

## 2016-09-22 ENCOUNTER — Encounter (HOSPITAL_COMMUNITY)
Admission: RE | Admit: 2016-09-22 | Discharge: 2016-09-22 | Disposition: A | Discharge status: Home / Self Care | Payer: Medicare Other | Source: Ambulatory Visit | Attending: Family Medicine | Admitting: Family Medicine

## 2016-09-22 DIAGNOSIS — D649 Anemia, unspecified: Secondary | ICD-10-CM | POA: Insufficient documentation

## 2016-09-22 DIAGNOSIS — E611 Iron deficiency: Secondary | ICD-10-CM | POA: Insufficient documentation

## 2016-09-22 MED ORDER — SODIUM CHLORIDE 0.9 % IV SOLN
510.0000 mg | INTRAVENOUS | Status: DC
  Administered 2016-09-22: 10:00:00 510 mg via INTRAVENOUS
  Filled 2016-09-22: qty 17

## 2016-09-22 NOTE — Discharge Instructions (Signed)

## 2016-09-27 ENCOUNTER — Ambulatory Visit (INDEPENDENT_AMBULATORY_CARE_PROVIDER_SITE_OTHER): Payer: Medicare Other

## 2016-09-27 ENCOUNTER — Ambulatory Visit (INDEPENDENT_AMBULATORY_CARE_PROVIDER_SITE_OTHER): Payer: Medicare Other | Admitting: Orthopaedic Surgery

## 2016-09-27 DIAGNOSIS — G8929 Other chronic pain: Secondary | ICD-10-CM | POA: Diagnosis not present

## 2016-09-27 DIAGNOSIS — M542 Cervicalgia: Secondary | ICD-10-CM | POA: Diagnosis not present

## 2016-09-27 DIAGNOSIS — M25512 Pain in left shoulder: Secondary | ICD-10-CM

## 2016-09-27 MED ORDER — TIZANIDINE HCL 4 MG PO TABS: 4.0000 mg | ORAL_TABLET | Freq: Four times a day (QID) | ORAL | 0 refills | Status: DC | PRN

## 2016-09-27 MED ORDER — PREDNISONE 10 MG (21) PO TBPK: ORAL_TABLET | ORAL | 0 refills | Status: DC

## 2016-09-27 NOTE — Progress Notes (Signed)
Office Visit Note   Patient: Stephanie Henson           Date of Birth: 1945-11-09           MRN: [id number] Visit Date: 09/27/2016              Requested by: Lupita Raider, MD [address] [place], [place] [address] PCP: Lupita Raider, MD   Assessment & Plan: Visit Diagnoses:  1. Chronic left shoulder pain   2. Neck pain     Plan: Patient requests to be seen by Dr. Otelia Sergeant as soon as possible. Referral was made today. I gave her a prescription for prednisone taper and Zanaflex. Hopefully this will give her some relief.  Follow-Up Instructions: Return if symptoms worsen or fail to improve.   Orders:  Orders Placed This Encounter  Procedures  . XR Cervical Spine 2 or 3 views  . XR Shoulder Left  . Ambulatory referral to Orthopedic Surgery   Meds ordered this encounter  Medications  . predniSONE (STERAPRED UNI-PAK 21 TAB) 10 MG (21) TBPK tablet    Sig: Take as directed    Dispense:  21 tablet    Refill:  0  . tiZANidine (ZANAFLEX) 4 MG tablet    Sig: Take 1 tablet (4 mg total) by mouth every 6 (six) hours as needed for muscle spasms.    Dispense:  30 tablet    Refill:  0      Procedures: No procedures performed   Clinical Data: No additional findings.   Subjective: Chief Complaint  Patient presents with  . Neck - Pain  . Left Shoulder - Pain    Patient is a 71 year old female comes in with left shoulder and neck pain chronically. She is status post ACDF by Dr. Otelia Sergeant.  She states that she has seen her PCP and Dr. Corliss Skains who have treated her conservatively but she continues to have pain. She denies any numbness and tingling.  She denies any injuries.    Review of Systems  Constitutional: Negative.   HENT: Negative.   Eyes: Negative.   Respiratory: Negative.   Cardiovascular: Negative.   Endocrine: Negative.   Musculoskeletal: Negative.   Neurological: Negative.   Hematological: Negative.   Psychiatric/Behavioral: Negative.   All  other systems reviewed and are negative.    Objective: Vital Signs: There were no vitals taken for this visit.  Physical Exam  Constitutional: She is oriented to person, place, and time. She appears well-developed and well-nourished.  HENT:  Head: Normocephalic and atraumatic.  Eyes: EOM are normal.  Neck: Neck supple.  Pulmonary/Chest: Effort normal.  Abdominal: Soft.  Neurological: She is alert and oriented to person, place, and time.  Skin: Skin is warm. Capillary refill takes less than 2 seconds.  Psychiatric: She has a normal mood and affect. Her behavior is normal. Judgment and thought content normal.  Nursing note and vitals reviewed.   Ortho Exam Left shoulder exam is benign. Negative Spurling test. No focal findings. Specialty Comments:  No specialty comments available.  Imaging: Xr Cervical Spine 2 Or 3 Views  Result Date: 09/27/2016 Stable fusion of prior ACDF  Xr Shoulder Left  Result Date: 09/27/2016 No acute findings. Prior distal clavicle excision    PMFS History: Patient Active Problem List   Diagnosis Date Noted  . Pre-diabetes 08/31/2016  . Systolic hypertension 08/31/2016  . Coronary artery calcification seen on CAT scan 05/18/2016  . Essential hypertension 05/18/2016  . Type 2 diabetes  mellitus without complication (HCC) 05/18/2016  . HLD (hyperlipidemia) 05/18/2016  . Plantar fasciitis 03/25/2016  . Anemia 10/12/2015  . Glaucoma 09/16/2015  . Pulmonary nodules 12/26/2012  . Chronic rhinitis 12/26/2012   Past Medical History:  Diagnosis Date  . Anxiety   . Cervicalgia   . CKD (chronic kidney disease) stage 3, GFR 30-59 ml/min   . Coronary artery calcification seen on CAT scan 05/18/2016   Myoview 7/18: EF 67, no ischemia; Low Risk   . Diabetes mellitus (HCC)   . Environmental allergies   . FH: colonic polyps   . Fibromyalgia   . GERD (gastroesophageal reflux disease)   . Goiter   . Hematuria   . Hyperlipidemia   . Hypertension   .  Osteoporosis   . Solitary pulmonary nodule     ;RUL - 7mm on CT 04/2014; CT 10/2014 resolution of nodule on RUL however persistant smaller nodules throughouts the lungs  . Weight loss     Family History  Problem Relation Age of Onset  . Heart disease Father   . Heart disease Brother   . Lung cancer Sister        smoked    Past Surgical History:  Procedure Laterality Date  . BACK SURGERY    . NASAL SINUS SURGERY    . NECK SURGERY     Social History   Occupational History  . Not on file.   Social History Main Topics  . Smoking status: Never Smoker  . Smokeless tobacco: Never Used  . Alcohol use No  . Drug use: No  . Sexual activity: Not on file

## 2016-09-27 NOTE — Telephone Encounter (Signed)
Lm to return call.

## 2016-09-28 ENCOUNTER — Ambulatory Visit (INDEPENDENT_AMBULATORY_CARE_PROVIDER_SITE_OTHER): Payer: Medicare Other

## 2016-09-28 DIAGNOSIS — R002 Palpitations: Secondary | ICD-10-CM

## 2016-09-28 DIAGNOSIS — R079 Chest pain, unspecified: Secondary | ICD-10-CM

## 2016-09-29 ENCOUNTER — Encounter (HOSPITAL_COMMUNITY)
Admission: RE | Admit: 2016-09-29 | Discharge: 2016-09-29 | Disposition: A | Discharge status: Home / Self Care | Payer: Medicare Other | Source: Ambulatory Visit | Attending: Family Medicine | Admitting: Family Medicine

## 2016-09-29 DIAGNOSIS — D649 Anemia, unspecified: Secondary | ICD-10-CM | POA: Diagnosis not present

## 2016-09-29 MED ORDER — SODIUM CHLORIDE 0.9 % IV SOLN
510.0000 mg | INTRAVENOUS | Status: DC
  Administered 2016-09-29: 510 mg via INTRAVENOUS
  Filled 2016-09-29: qty 17

## 2016-09-29 NOTE — Telephone Encounter (Signed)
LMVM to call office back.

## 2016-10-14 ENCOUNTER — Other Ambulatory Visit (INDEPENDENT_AMBULATORY_CARE_PROVIDER_SITE_OTHER): Payer: Self-pay | Admitting: Orthopaedic Surgery

## 2016-10-26 ENCOUNTER — Ambulatory Visit: Payer: Medicare Other | Admitting: Cardiology

## 2016-10-28 ENCOUNTER — Ambulatory Visit (INDEPENDENT_AMBULATORY_CARE_PROVIDER_SITE_OTHER): Payer: Medicare Other | Admitting: Orthopaedic Surgery

## 2016-11-10 ENCOUNTER — Encounter: Payer: Self-pay | Admitting: Cardiology

## 2016-11-10 ENCOUNTER — Ambulatory Visit (INDEPENDENT_AMBULATORY_CARE_PROVIDER_SITE_OTHER): Payer: Medicare Other | Admitting: Cardiology

## 2016-11-10 VITALS — BP 122/78 | HR 58 | Ht 64.0 in | Wt 114.4 lb

## 2016-11-10 DIAGNOSIS — I10 Essential (primary) hypertension: Secondary | ICD-10-CM | POA: Diagnosis not present

## 2016-11-10 DIAGNOSIS — R002 Palpitations: Secondary | ICD-10-CM

## 2016-11-10 NOTE — Progress Notes (Signed)
Electrophysiology Office Note   Date:  11/10/2016   ID:  Tramaine, Snell 1945-02-23, MRN [id number]  PCP:  Lupita Raider, MD  Cardiologist:   Primary Electrophysiologist:  Will Jorja Loa, MD    Chief Complaint  Patient presents with  . Follow-up    Palpitations/monitor results  . Dizziness     History of Present Illness: Stephanie Henson is a 71 y.o. female who is being seen today for the evaluation of palpitations at the request of Lupita Raider, MD. Presenting today for electrophysiology evaluation. She has a history of diabetes, hypertension, hyperlipidemia, GERD, CKG stage III, pulmonary nodules, fibromyalgia, and goiter. She was having symptoms of fatigue and bradycardia in 2017. Holter monitor that time showed a heart rate of 63 in sinus rhythm with possible atrial tachycardia.    Today, she denies symptoms of palpitations, chest pain, shortness of breath, orthopnea, PND, lower extremity edema, claudication, dizziness, presyncope, syncope, bleeding, or neurologic sequela. The patient is tolerating medications without difficulties.  Her palpitations have gotten much better. She has had times where she wakes up from sleep and her heart rate is in the 90s. She says that this is quite short lived.   Past Medical History:  Diagnosis Date  . Anxiety   . Cervicalgia   . CKD (chronic kidney disease) stage 3, GFR 30-59 ml/min   . Coronary artery calcification seen on CAT scan 05/18/2016   Myoview 7/18: EF 67, no ischemia; Low Risk   . Diabetes mellitus (HCC)   . Environmental allergies   . FH: colonic polyps   . Fibromyalgia   . GERD (gastroesophageal reflux disease)   . Goiter   . Hematuria   . Hyperlipidemia   . Hypertension   . Osteoporosis   . Solitary pulmonary nodule     ;RUL - 7mm on CT 04/2014; CT 10/2014 resolution of nodule on RUL however persistant smaller nodules throughouts the lungs  . Weight loss    Past Surgical History:  Procedure Laterality  Date  . BACK SURGERY    . NASAL SINUS SURGERY    . NECK SURGERY       Current Outpatient Prescriptions  Medication Sig Dispense Refill  . ALPHAGAN P 0.1 % SOLN INSTILL 1 DROP IN BOTH EYES THREE TIMES A DAY  99  . aspirin 81 MG tablet Take 81 mg by mouth daily.    Marland Kitchen azelastine (ASTELIN) 0.1 % nasal spray USE 2 SPRAYS INTO EACH NOSTRIL TWICE A DAY  0  . BETIMOL 0.5 % ophthalmic solution Place 1 drop into both eyes daily. Each eye once per day    . Calcium Carbonate-Vitamin D (CALCIUM 500 + D PO) Take 1 tablet by mouth daily.    Marland Kitchen CAPSAICIN HOT PATCH EX Apply 1 patch topically daily as needed (pain).    Marland Kitchen latanoprost (XALATAN) 0.005 % ophthalmic solution Place 1 drop into both eyes at bedtime.     Marland Kitchen LORazepam (ATIVAN) 0.5 MG tablet Take 0.5 mg by mouth 2 (two) times daily.    Marland Kitchen losartan (COZAAR) 50 MG tablet Take 1 tablet (50 mg total) by mouth daily. 30 tablet 3  . meclizine (ANTIVERT) 25 MG tablet Take 1/2-1 pill every 6 or 8 hours as needed for dizziness. Will cause drowsiness. 30 tablet 0  . Omega-3 Fatty Acids (FISH OIL) 1000 MG CAPS Take 2 capsules by mouth daily.     . rosuvastatin (CRESTOR) 10 MG tablet Take 10 mg by mouth daily.  1  .  traMADol (ULTRAM) 50 MG tablet Take 1 tablet (50 mg total) by mouth every 6 (six) hours as needed. 15 tablet 0  . vitamin B-12 (CYANOCOBALAMIN) 1000 MCG tablet Take 1,000 mcg by mouth daily.     No current facility-administered medications for this visit.     Allergies:   Asa [aspirin]; Codeine; Sudafed [pseudoephedrine hcl]; and Tylenol [acetaminophen]   Social History:  The patient  reports that she has never smoked. She has never used smokeless tobacco. She reports that she does not drink alcohol or use drugs.   Family History:  The patient's family history includes Heart disease in her brother and father; Lung cancer in her sister.    ROS:  Please see the history of present illness.   Otherwise, review of systems is positive for Chest pain,  dyspnea on exertion, back pain, muscle pain, balance problems, dizziness.   All other systems are reviewed and negative.    PHYSICAL EXAM: VS:  BP 122/78   Pulse (!) 58   Ht  (1.626 m)   Wt 114 lb 6.4 oz (51.9 kg)   SpO2 92%   BMI 19.64 kg/m  , BMI Body mass index is 19.64 kg/m. GEN: Well nourished, well developed, in no acute distress  HEENT: normal  Neck: no JVD, carotid bruits, or masses Cardiac: RRR; no murmurs, rubs, or gallops,no edema  Respiratory:  clear to auscultation bilaterally, normal work of breathing GI: soft, nontender, nondistended, + BS MS: no deformity or atrophy  Skin: warm and dry Neuro:  Strength and sensation are intact Psych: euthymic mood, full affect  EKG:  EKG is not ordered today. Personal review of the ekg ordered 08/31/16 shows SR, rate 54  Recent Labs: 05/18/2016: TSH 0.835 05/28/2016: Platelets 189 08/31/2016: ALT 16; BUN 16; Creatinine, Ser 0.85; Hemoglobin 10.8; Potassium 4.3; Sodium 141    Lipid Panel  No results found for: CHOL, TRIG, HDL, CHOLHDL, VLDL, LDLCALC, LDLDIRECT   Wt Readings from Last 3 Encounters:  11/10/16 114 lb 6.4 oz (51.9 kg)  09/22/16 112 lb (50.8 kg)  09/06/16 116 lb (52.6 kg)      Other studies Reviewed: Additional studies/ records that were reviewed today include: 30 day monitor 11/01/16  Review of the above records today demonstrates:  Sinus rhythm with sinus tachycardia and sinus bradycardia seen Symptoms of palpitations, chest pain associated with atrial runs of up to 5 beats but also with sinus rhythm Symptoms of passing out associated with sinus rhythm  Myoview 09/06/16  Nuclear stress EF: 67%. No wall motion abnormalities.  4:8min exercise. Non specific ST segment changes at stress.  This is a low risk study. No ischemia.  The study is normal.   ASSESSMENT AND PLAN:  1.  Palpitations: 30 day monitor showed sinus rhythm and sinus bradycardia. Palpitations at the time were associated with runs  of 5 beats of atrial tachycardia. At this time, I do not see a reason to start her on new medications. Her heart rate and blood pressure well controlled. She is having minimal symptoms.  2. Coronary artery calcification on CT scan: Myoview read as normal. Continue aspirin and statin.  3. Essential hypertension: Currently blood pressure is well-controlled. No changes at this time. :   Current medicines are reviewed at length with the patient today.   The patient does not have concerns regarding her medicines.  The following changes were made today:  none  Labs/ tests ordered today include:  No orders of the defined types were  placed in this encounter.    Disposition:   FU with Will Camnitz PRN  Signed, Will Jorja Loa, MD  11/10/2016 9:50 AM     [facility] [address] [place] [address] [phone] (office) [phone] (fax)

## 2016-11-10 NOTE — Patient Instructions (Signed)
Medication Instructions:  Your physician recommends that you continue on your current medications as directed. Please refer to the Current Medication list given to you today.  * If you need a refill on your cardiac medications before your next appointment, please call your pharmacy.   Labwork: None ordered  Testing/Procedures: None ordered  Follow-Up: No follow up is needed at this time with Dr. Camnitz.  He will see you on an as needed basis.   Thank you for choosing [facility]!!   Stephaun Million, RN [phone]     

## 2016-11-16 ENCOUNTER — Ambulatory Visit (INDEPENDENT_AMBULATORY_CARE_PROVIDER_SITE_OTHER): Payer: Medicare Other | Admitting: Specialist

## 2016-11-16 ENCOUNTER — Encounter (INDEPENDENT_AMBULATORY_CARE_PROVIDER_SITE_OTHER): Payer: Self-pay | Admitting: Specialist

## 2016-11-16 VITALS — BP 155/76 | HR 54 | Ht 64.0 in | Wt 114.0 lb

## 2016-11-16 DIAGNOSIS — M542 Cervicalgia: Secondary | ICD-10-CM

## 2016-11-16 DIAGNOSIS — M4712 Other spondylosis with myelopathy, cervical region: Secondary | ICD-10-CM | POA: Diagnosis not present

## 2016-11-16 DIAGNOSIS — M778 Other enthesopathies, not elsewhere classified: Secondary | ICD-10-CM

## 2016-11-16 DIAGNOSIS — M7582 Other shoulder lesions, left shoulder: Secondary | ICD-10-CM

## 2016-11-16 MED ORDER — ALPRAZOLAM 0.25 MG PO TABS: ORAL_TABLET | ORAL | 0 refills | Status: DC

## 2016-11-16 NOTE — Progress Notes (Signed)
Office Visit Note   Patient: Stephanie Henson           Date of Birth: Nov 18, 1945           MRN: [id number] Visit Date: 11/16/2016              Requested by: Lupita Raider, MD [address] [place], [place] [address] PCP: Lupita Raider, MD   Assessment & Plan: Visit Diagnoses: No diagnosis found.  Plan:Avoid overhead lifting and overhead use of the arms. Do not lift greater than 5 lbs. Adjust head rest in vehicle to prevent hyperextension if rear ended. Take extra precautions to avoid falling, including use of a cane if you feel weak. Tylenol ES 3 times a day Ice or moist heat to the neck three times Fall Prevention and Home Safety Falls cause injuries and can affect all age groups. It is possible to use preventive measures to significantly decrease the likelihood of falls. There are many simple measures which can make your home safer and prevent falls. OUTDOORS  Repair cracks and edges of walkways and driveways.  Remove high doorway thresholds.  Trim shrubbery on the main path into your home.  Have good outside lighting.  Clear walkways of tools, rocks, debris, and clutter.  Check that handrails are not broken and are securely fastened. Both sides of steps should have handrails.  Have leaves, snow, and ice cleared regularly.  Use sand or salt on walkways during winter months.  In the garage, clean up grease or oil spills. BATHROOM  Install night lights.  Install grab bars by the toilet and in the tub and shower.  Use non-skid mats or decals in the tub or shower.  Place a plastic non-slip stool in the shower to sit on, if needed.  Keep floors dry and clean up all water on the floor immediately.  Remove soap buildup in the tub or shower on a regular basis.  Secure bath mats with non-slip, double-sided rug tape.  Remove throw rugs and tripping hazards from the floors. BEDROOMS  Install night lights.  Make sure a bedside light is easy to  reach.  Do not use oversized bedding.  Keep a telephone by your bedside.  Have a firm chair with side arms to use for getting dressed.  Remove throw rugs and tripping hazards from the floor. KITCHEN  Keep handles on pots and pans turned toward the center of the stove. Use back burners when possible.  Clean up spills quickly and allow time for drying.  Avoid walking on wet floors.  Avoid hot utensils and knives.  Position shelves so they are not too high or low.  Place commonly used objects within easy reach.  If necessary, use a sturdy step stool with a grab bar when reaching.  Keep electrical cables out of the way.  Do not use floor polish or wax that makes floors slippery. If you must use wax, use non-skid floor wax.  Remove throw rugs and tripping hazards from the floor. STAIRWAYS  Never leave objects on stairs.  Place handrails on both sides of stairways and use them. Fix any loose handrails. Make sure handrails on both sides of the stairways are as long as the stairs.  Check carpeting to make sure it is firmly attached along stairs. Make repairs to worn or loose carpet promptly.  Avoid placing throw rugs at the top or bottom of stairways, or properly secure the rug with carpet tape to prevent slippage. Get  rid of throw rugs, if possible.  Have an electrician put in a light switch at the top and bottom of the stairs. OTHER FALL PREVENTION TIPS  Wear low-heel or rubber-soled shoes that are supportive and fit well. Wear closed toe shoes.  When using a stepladder, make sure it is fully opened and both spreaders are firmly locked. Do not climb a closed stepladder.  Add color or contrast paint or tape to grab bars and handrails in your home. Place contrasting color strips on first and last steps.  Learn and use mobility aids as needed. Install an electrical emergency response system.  Turn on lights to avoid dark areas. Replace light bulbs that burn out immediately.  Get light switches that glow.  Arrange furniture to create clear pathways. Keep furniture in the same place.  Firmly attach carpet with non-skid or double-sided tape.  Eliminate uneven floor surfaces.  Select a carpet pattern that does not visually hide the edge of steps.  Be aware of all pets. OTHER HOME SAFETY TIPS  Set the water temperature for 120 F (48.8 C).  Keep emergency numbers on or near the telephone.  Keep smoke detectors on every level of the home and near sleeping areas. Document Released: 01/28/2002 Document Revised: 08/09/2011 Document Reviewed: 04/29/2011 [facility] Patient Information 2014 [place], [place].  Follow-Up Instructions: No Follow-up on file.   Orders:  No orders of the defined types were placed in this encounter.  No orders of the defined types were placed in this encounter.     Procedures: No procedures performed   Clinical Data: No additional findings.   Subjective: Chief Complaint  Patient presents with  . Neck - Pain  . Left Shoulder - Pain    71 year old female right handed, she has history of previous cervical spine surgery 2004 by myself she has been having worsening pain over the years.  Has used patches capsaicin, stopped NSAIDs due to HTN, she has diabetes, it is controlled by diet. The pain is mainly right greater than left more symmetric lately and radiates down the back between the shoulder blades with associated muscle spasms. No bowel or bladder difficulty. She is walking With no falls, does have history of low iron and anemia with some dizziness this past summer. She receive an iron infusion in August, received 2 iron infusions. Sees Dr. Cam Hai for primary care.      Review of Systems  Constitutional: Positive for activity change and fatigue. Negative for appetite change and unexpected weight change.  HENT: Positive for postnasal drip, rhinorrhea, sinus pain and sinus pressure.   Eyes: Positive for pain, redness  and itching.  Respiratory: Negative for shortness of breath and wheezing.   Cardiovascular: Negative for chest pain and leg swelling.  Gastrointestinal: Negative for abdominal distention, blood in stool, constipation, nausea, rectal pain and vomiting.  Endocrine: Negative for cold intolerance and heat intolerance.  Genitourinary: Negative for difficulty urinating, dyspareunia, dysuria, enuresis, flank pain, genital sores, hematuria and pelvic pain.  Musculoskeletal: Positive for arthralgias, back pain, neck pain and neck stiffness.  Skin: Negative for color change, pallor, rash and wound.  Neurological: Positive for dizziness, weakness and light-headedness.  Hematological: Negative for adenopathy. Does not bruise/bleed easily.  Psychiatric/Behavioral: Negative for agitation, behavioral problems, confusion, decreased concentration, dysphoric mood, hallucinations, self-injury, sleep disturbance and suicidal ideas. The patient is not nervous/anxious and is not hyperactive.      Objective: Vital Signs: BP (!) 155/76 (BP Location: Left Arm, Patient Position: Sitting)  Pulse (!) 54   Ht  (1.626 m)   Wt 114 lb (51.7 kg)   BMI 19.57 kg/m   Physical Exam  Constitutional: She is oriented to person, place, and time. She appears well-developed and well-nourished.  HENT:  Head: Normocephalic and atraumatic.  Eyes: Pupils are equal, round, and reactive to light. EOM are normal.  Neck: Normal range of motion. Neck supple.  Pulmonary/Chest: Effort normal and breath sounds normal.  Abdominal: Soft. Bowel sounds are normal.  Neurological: She is alert and oriented to person, place, and time.  Skin: Skin is warm and dry.  Psychiatric: She has a normal mood and affect. Her behavior is normal. Judgment and thought content normal.    Back Exam   Tenderness  The patient is experiencing tenderness in the cervical.  Range of Motion  Extension:  60 abnormal  Flexion:  60 abnormal  Lateral Bend  Right:  60 abnormal  Lateral Bend Left:  60 abnormal  Rotation Right: 60  Rotation Left: 60   Muscle Strength  Right Quadriceps:  5/5  Left Quadriceps:  5/5  Left Hamstrings:  5/5   Tests  Straight leg raise right: negative Straight leg raise left: negative  Reflexes  Patellar: Hyperreflexic Achilles: Hyperreflexic Biceps: Hyperreflexic  Other  Toe Walk: normal Heel Walk: normal Sensation: normal Gait: normal  Erythema: no back redness Scars: absent      Specialty Comments:  No specialty comments available.  Imaging: No results found.   PMFS History: Patient Active Problem List   Diagnosis Date Noted  . Pre-diabetes 08/31/2016  . Systolic hypertension 08/31/2016  . Coronary artery calcification seen on CAT scan 05/18/2016  . Essential hypertension 05/18/2016  . Type 2 diabetes mellitus without complication (HCC) 05/18/2016  . HLD (hyperlipidemia) 05/18/2016  . Plantar fasciitis 03/25/2016  . Anemia 10/12/2015  . Glaucoma 09/16/2015  . Pulmonary nodules 12/26/2012  . Chronic rhinitis 12/26/2012   Past Medical History:  Diagnosis Date  . Anxiety   . Cervicalgia   . CKD (chronic kidney disease) stage 3, GFR 30-59 ml/min   . Coronary artery calcification seen on CAT scan 05/18/2016   Myoview 7/18: EF 67, no ischemia; Low Risk   . Diabetes mellitus (HCC)   . Environmental allergies   . FH: colonic polyps   . Fibromyalgia   . GERD (gastroesophageal reflux disease)   . Goiter   . Hematuria   . Hyperlipidemia   . Hypertension   . Osteoporosis   . Solitary pulmonary nodule     ;RUL - 7mm on CT 04/2014; CT 10/2014 resolution of nodule on RUL however persistant smaller nodules throughouts the lungs  . Weight loss     Family History  Problem Relation Age of Onset  . Heart disease Father   . Heart disease Brother   . Lung cancer Sister        smoked    Past Surgical History:  Procedure Laterality Date  . BACK SURGERY    . NASAL SINUS SURGERY    .  NECK SURGERY     Social History   Occupational History  . Not on file.   Social History Main Topics  . Smoking status: Never Smoker  . Smokeless tobacco: Never Used  . Alcohol use No  . Drug use: No  . Sexual activity: Not on file

## 2016-11-16 NOTE — Addendum Note (Signed)
Addended by: Vira Browns on: 11/16/2016 11:17 AM   Modules accepted: Orders

## 2016-11-16 NOTE — Patient Instructions (Addendum)
Avoid overhead lifting and overhead use of the arms. Do not lift greater than 5 lbs. Adjust head rest in vehicle to prevent hyperextension if rear ended. Take extra precautions to avoid falling, including use of a cane if you feel weak Tylenol ES 3 times a day Ice or moist heat to the neck three times Fall Prevention and Home Safety Falls cause injuries and can affect all age groups. It is possible to use preventive measures to significantly decrease the likelihood of falls. There are many simple measures which can make your home safer and prevent falls. OUTDOORS  Repair cracks and edges of walkways and driveways.  Remove high doorway thresholds.  Trim shrubbery on the main path into your home.  Have good outside lighting.  Clear walkways of tools, rocks, debris, and clutter.  Check that handrails are not broken and are securely fastened. Both sides of steps should have handrails.  Have leaves, snow, and ice cleared regularly.  Use sand or salt on walkways during winter months.  In the garage, clean up grease or oil spills. BATHROOM  Install night lights.  Install grab bars by the toilet and in the tub and shower.  Use non-skid mats or decals in the tub or shower.  Place a plastic non-slip stool in the shower to sit on, if needed.  Keep floors dry and clean up all water on the floor immediately.  Remove soap buildup in the tub or shower on a regular basis.  Secure bath mats with non-slip, double-sided rug tape.  Remove throw rugs and tripping hazards from the floors. BEDROOMS  Install night lights.  Make sure a bedside light is easy to reach.  Do not use oversized bedding.  Keep a telephone by your bedside.  Have a firm chair with side arms to use for getting dressed.  Remove throw rugs and tripping hazards from the floor. KITCHEN  Keep handles on pots and pans turned toward the center of the stove. Use back burners when possible.  Clean up spills quickly  and allow time for drying.  Avoid walking on wet floors.  Avoid hot utensils and knives.  Position shelves so they are not too high or low.  Place commonly used objects within easy reach.  If necessary, use a sturdy step stool with a grab bar when reaching.  Keep electrical cables out of the way.  Do not use floor polish or wax that makes floors slippery. If you must use wax, use non-skid floor wax.  Remove throw rugs and tripping hazards from the floor. STAIRWAYS  Never leave objects on stairs.  Place handrails on both sides of stairways and use them. Fix any loose handrails. Make sure handrails on both sides of the stairways are as long as the stairs.  Check carpeting to make sure it is firmly attached along stairs. Make repairs to worn or loose carpet promptly.  Avoid placing throw rugs at the top or bottom of stairways, or properly secure the rug with carpet tape to prevent slippage. Get rid of throw rugs, if possible.  Have an electrician put in a light switch at the top and bottom of the stairs. OTHER FALL PREVENTION TIPS  Wear low-heel or rubber-soled shoes that are supportive and fit well. Wear closed toe shoes.  When using a stepladder, make sure it is fully opened and both spreaders are firmly locked. Do not climb a closed stepladder.  Add color or contrast paint or tape to grab bars and handrails in your  home. Place contrasting color strips on first and last steps.  Learn and use mobility aids as needed. Install an electrical emergency response system.  Turn on lights to avoid dark areas. Replace light bulbs that burn out immediately. Get light switches that glow.  Arrange furniture to create clear pathways. Keep furniture in the same place.  Firmly attach carpet with non-skid or double-sided tape.  Eliminate uneven floor surfaces.  Select a carpet pattern that does not visually hide the edge of steps.  Be aware of all pets. OTHER HOME SAFETY TIPS  Set  the water temperature for 120 F (48.8 C).  Keep emergency numbers on or near the telephone.  Keep smoke detectors on every level of the home and near sleeping areas. Document Released: 01/28/2002 Document Revised: 08/09/2011 Document Reviewed: 04/29/2011 [facility] Patient Information 2014 [place], [place].

## 2016-12-01 ENCOUNTER — Inpatient Hospital Stay: Admission: RE | Admit: 2016-12-01 | Payer: Medicare Other | Source: Ambulatory Visit

## 2016-12-08 ENCOUNTER — Ambulatory Visit (INDEPENDENT_AMBULATORY_CARE_PROVIDER_SITE_OTHER): Payer: Medicare Other | Admitting: Surgery

## 2016-12-20 ENCOUNTER — Other Ambulatory Visit: Payer: Self-pay | Admitting: Family Medicine

## 2016-12-20 DIAGNOSIS — I10 Essential (primary) hypertension: Secondary | ICD-10-CM

## 2017-01-19 ENCOUNTER — Other Ambulatory Visit: Payer: Self-pay | Admitting: Family Medicine

## 2017-01-19 DIAGNOSIS — I10 Essential (primary) hypertension: Secondary | ICD-10-CM

## 2017-01-19 NOTE — Telephone Encounter (Signed)
Wasn't sure if Dr. Alwyn RenHopper wanted to see this pt before refilling the Losartan.  There is a note that she needs an appt from the pharmacy.   Thanks.

## 2017-03-28 ENCOUNTER — Telehealth (INDEPENDENT_AMBULATORY_CARE_PROVIDER_SITE_OTHER): Payer: Self-pay | Admitting: Specialist

## 2017-03-28 NOTE — Telephone Encounter (Signed)
Patient missed last MRI scheduled due to a bladder infection. She said she will call and schedule another one, she needs Dr. Otelia SergeantNitka to call her in Xanax into [facility]. She would like a call once this is done.----Please advise, Last OV was 11/16/2016

## 2017-03-28 NOTE — Telephone Encounter (Signed)
Patient missed last MRI scheduled due to a bladder infection. She said she will call and schedule another one, she needs Dr. Otelia SergeantNitka to call her in Xanax into [facility]. She would like a call once this is done, # [phone]

## 2017-03-30 ENCOUNTER — Ambulatory Visit (INDEPENDENT_AMBULATORY_CARE_PROVIDER_SITE_OTHER): Payer: Medicare Other | Admitting: Family Medicine

## 2017-03-30 ENCOUNTER — Encounter: Payer: Self-pay | Admitting: Family Medicine

## 2017-03-30 ENCOUNTER — Other Ambulatory Visit (INDEPENDENT_AMBULATORY_CARE_PROVIDER_SITE_OTHER): Payer: Self-pay | Admitting: Specialist

## 2017-03-30 VITALS — BP 128/84 | HR 83 | Temp 98.3°F | Ht 64.0 in | Wt 111.4 lb

## 2017-03-30 DIAGNOSIS — E782 Mixed hyperlipidemia: Secondary | ICD-10-CM | POA: Diagnosis not present

## 2017-03-30 DIAGNOSIS — I10 Essential (primary) hypertension: Secondary | ICD-10-CM

## 2017-03-30 DIAGNOSIS — J310 Chronic rhinitis: Secondary | ICD-10-CM | POA: Diagnosis not present

## 2017-03-30 DIAGNOSIS — M1990 Unspecified osteoarthritis, unspecified site: Secondary | ICD-10-CM

## 2017-03-30 DIAGNOSIS — F419 Anxiety disorder, unspecified: Secondary | ICD-10-CM | POA: Diagnosis not present

## 2017-03-30 MED ORDER — ALPRAZOLAM 0.25 MG PO TABS: ORAL_TABLET | ORAL | 0 refills | Status: DC

## 2017-03-30 NOTE — Progress Notes (Signed)
Subjective:  Patient ID: Stephanie Henson, female    DOB: 23-Feb-1945  Age: 72 y.o. MRN: [id number]  CC: Establish Care   HPI Stephanie Henson presents to discuss transferring her care to this clinic.  However she says that she did not realize how far out this clinic is from her house.  She tells me that there is some difficulty in seeing her current primary care doctor.  Patient has a past medical history chronic arthritis, anxiety, hypertension, prediabetes, osteoporosis and chronic rhinitis.  She tells me that she was started on a blood pressure medicine 1 month ago and decided to stop taking it herself.  In the last month or 2 she became severely anemic and required iron transfusions.  She denies bleeding per rectum or hematuria.  Denies melena.  She tells me that she has had a colonoscopy in the recent past we do not see evidence of that in the medical record.  She takes lorazepam twice daily and tells me that she was not aware that there are alternatives that medicine for treatment of anxiety.  She takes Ultram as needed as needed for arthritic aches and pains. She has a past medical history of osteoporosis.  I am concerned about her fall risk taking two sedating medicines.  I said that we would need to consider alternative treatments for her anxiety.  She does have chronic kidney disease and GERD.  Ultram may wind up being the best choice for her arthritis pain.  In any event she said that her clinic is too far away from her home. History Michole has a past medical history of Anxiety, Cervicalgia, CKD (chronic kidney disease) stage 3, GFR 30-59 ml/min (HCC), Coronary artery calcification seen on CAT scan (05/18/2016), Diabetes mellitus (HCC), Environmental allergies, FH: colonic polyps, Fibromyalgia, GERD (gastroesophageal reflux disease), Goiter, Hematuria, Hyperlipidemia, Hypertension, Osteoporosis, Solitary pulmonary nodule, and Weight loss.   She has a past surgical history that includes Nasal  sinus surgery; Back surgery; and Neck surgery.   Her family history includes Heart disease in her brother and father; Lung cancer in her sister.She reports that  has never smoked. she has never used smokeless tobacco. She reports that she does not drink alcohol or use drugs.  Outpatient Medications Prior to Visit  Medication Sig Dispense Refill  . ALPHAGAN P 0.1 % SOLN INSTILL 1 DROP IN BOTH EYES THREE TIMES A DAY  99  . ALPRAZolam (XANAX) 0.25 MG tablet Take one at the MRI and may repeat x 1. For anxiety 2 tablet 0  . aspirin 81 MG tablet Take 81 mg by mouth daily.    Marland Kitchen. azelastine (ASTELIN) 0.1 % nasal spray USE 2 SPRAYS INTO EACH NOSTRIL TWICE A DAY  0  . BETIMOL 0.5 % ophthalmic solution Place 1 drop into both eyes daily. Each eye once per day    . Calcium Carbonate-Vitamin D (CALCIUM 500 + D PO) Take 1 tablet by mouth daily.    Marland Kitchen. CAPSAICIN HOT PATCH EX Apply 1 patch topically daily as needed (pain).    Marland Kitchen. latanoprost (XALATAN) 0.005 % ophthalmic solution Place 1 drop into both eyes at bedtime.     Marland Kitchen. LORazepam (ATIVAN) 0.5 MG tablet Take 0.5 mg by mouth 2 (two) times daily.    Marland Kitchen. losartan (COZAAR) 50 MG tablet Take 1 tablet (50 mg total) by mouth daily. Needs office visit for any additional refills 30 tablet 0  . meclizine (ANTIVERT) 25 MG tablet Take 1/2-1 pill every 6 or  8 hours as needed for dizziness. Will cause drowsiness. 30 tablet 0  . Omega-3 Fatty Acids (FISH OIL) 1000 MG CAPS Take 2 capsules by mouth daily.     . rosuvastatin (CRESTOR) 10 MG tablet Take 10 mg by mouth daily.  1  . traMADol (ULTRAM) 50 MG tablet Take 1 tablet (50 mg total) by mouth every 6 (six) hours as needed. 15 tablet 0  . vitamin B-12 (CYANOCOBALAMIN) 1000 MCG tablet Take 1,000 mcg by mouth daily.     No facility-administered medications prior to visit.     ROS Review of Systems  Constitutional: Negative for chills, fatigue and fever.  HENT: Positive for congestion, postnasal drip and rhinorrhea.   Eyes:  Negative for photophobia and visual disturbance.  Respiratory: Negative.   Cardiovascular: Negative.   Gastrointestinal: Negative.  Negative for anal bleeding, blood in stool and constipation.  Endocrine: Negative for polyphagia and polyuria.  Genitourinary: Negative for hematuria.  Musculoskeletal: Positive for arthralgias.  Hematological: Does not bruise/bleed easily.  Psychiatric/Behavioral: The patient is nervous/anxious.     Objective:  BP 128/84 (BP Location: Left Arm, Patient Position: Sitting, Cuff Size: Normal)   Pulse 83   Temp 98.3 F (36.8 C) (Oral)   Ht 5\' 4"  (1.626 m)   Wt 111 lb 6 oz (50.5 kg)   SpO2 95%   BMI 19.12 kg/m   Physical Exam  Constitutional: She is oriented to person, place, and time. She appears well-developed and well-nourished. No distress.  HENT:  Head: Normocephalic and atraumatic.  Right Ear: External ear normal.  Left Ear: External ear normal.  Mouth/Throat: Oropharynx is clear and moist. No oropharyngeal exudate.  Eyes: Conjunctivae are normal. Pupils are equal, round, and reactive to light. Right eye exhibits no discharge. Left eye exhibits no discharge. No scleral icterus.  Neck: Neck supple. No JVD present. No tracheal deviation present. No thyromegaly present.  Cardiovascular: Normal rate, regular rhythm and normal heart sounds.  Pulmonary/Chest: Effort normal and breath sounds normal. No stridor.  Lymphadenopathy:    She has no cervical adenopathy.  Neurological: She is alert and oriented to person, place, and time.  Skin: Skin is warm and dry. She is not diaphoretic.  Psychiatric: She has a normal mood and affect. Her behavior is normal.      Assessment & Plan:   Stephanie Henson was seen today for establish care.  Diagnoses and all orders for this visit:  Essential hypertension  Chronic rhinitis  Mixed hyperlipidemia  Arthritis  Anxiety   I am having Stephanie Henson maintain her latanoprost, BETIMOL, aspirin, Calcium  Carbonate-Vitamin D (CALCIUM 500 + D PO), LORazepam, ALPHAGAN P, azelastine, Fish Oil, rosuvastatin, CAPSAICIN HOT PATCH EX, traMADol, vitamin B-12, meclizine, losartan, and ALPRAZolam.  No orders of the defined types were placed in this encounter.  I voiced my concern to her about using lorazepam with Ultram along with her history of osteoporosis.  We are not in a convenient location for her and she may continue to use her existing primary care doctor.  Follow-up: No Follow-up on file.  Mliss Sax, MD

## 2017-03-30 NOTE — Telephone Encounter (Signed)
Patient called again about calling in the medication to relax her before getting her MRI done on Thursday, Feb 13th.  Thank you.

## 2017-03-30 NOTE — Telephone Encounter (Signed)
I called and lmom that rx was called into her pharm

## 2017-04-04 ENCOUNTER — Encounter: Payer: Self-pay | Admitting: Family Medicine

## 2017-04-04 ENCOUNTER — Ambulatory Visit (INDEPENDENT_AMBULATORY_CARE_PROVIDER_SITE_OTHER): Payer: Medicare Other | Admitting: Family Medicine

## 2017-04-04 ENCOUNTER — Other Ambulatory Visit: Payer: Medicare Other

## 2017-04-04 VITALS — BP 138/76 | HR 69 | Temp 97.4°F | Ht 64.0 in | Wt 112.4 lb

## 2017-04-04 DIAGNOSIS — J310 Chronic rhinitis: Secondary | ICD-10-CM | POA: Diagnosis not present

## 2017-04-04 DIAGNOSIS — J0100 Acute maxillary sinusitis, unspecified: Secondary | ICD-10-CM | POA: Diagnosis not present

## 2017-04-04 MED ORDER — FLUTICASONE PROPIONATE 50 MCG/ACT NA SUSP: 1.0000 | Freq: Every day | NASAL | 6 refills | Status: DC

## 2017-04-04 MED ORDER — AMOXICILLIN 500 MG PO CAPS: 500.0000 mg | ORAL_CAPSULE | Freq: Three times a day (TID) | ORAL | 0 refills | Status: DC

## 2017-04-04 NOTE — Progress Notes (Signed)
Subjective:  Patient ID: Stephanie Henson, female    DOB: March 22, 1945  Age: 72 y.o. MRN: [id number]  CC: Sinus Problem   HPI [name] presents for one day history of facial pressure over her cheekbones and the bridge of her nose.  She is producing rhinorrhea but is has not noted its appearance.  She has ongoing postnasal drip with a mild cough.  She denies wheezing or history of asthma or tobacco exposure.  Her husband quit smoking 40 years ago she tells me.  Denies fevers chills or teeth pain.  She has ongoing allergy symptoms with sneezing rhinorrhea and postnasal drip.  She has been using an over-the-counter allergy pill for this and is not sure what it is.  She does not tolerate prednisone but has used nasal steroids successfully in the past.  She is not sure which nasal steroid she has at home or if she has one at all.  Outpatient Medications Prior to Visit  Medication Sig Dispense Refill  . ALPHAGAN P 0.1 % SOLN INSTILL 1 DROP IN BOTH EYES THREE TIMES A DAY  99  . ALPRAZolam (XANAX) 0.25 MG tablet Take one at the MRI and may repeat x 1. For anxiety 2 tablet 0  . aspirin 81 MG tablet Take 81 mg by mouth daily.    Marland Kitchen azelastine (ASTELIN) 0.1 % nasal spray USE 2 SPRAYS INTO EACH NOSTRIL TWICE A DAY  0  . BETIMOL 0.5 % ophthalmic solution Place 1 drop into both eyes daily. Each eye once per day    . Calcium Carbonate-Vitamin D (CALCIUM 500 + D PO) Take 1 tablet by mouth daily.    Marland Kitchen CAPSAICIN HOT PATCH EX Apply 1 patch topically daily as needed (pain).    Marland Kitchen latanoprost (XALATAN) 0.005 % ophthalmic solution Place 1 drop into both eyes at bedtime.     Marland Kitchen LORazepam (ATIVAN) 0.5 MG tablet Take 0.5 mg by mouth 2 (two) times daily.    Marland Kitchen losartan (COZAAR) 50 MG tablet Take 1 tablet (50 mg total) by mouth daily. Needs office visit for any additional refills 30 tablet 0  . meclizine (ANTIVERT) 25 MG tablet Take 1/2-1 pill every 6 or 8 hours as needed for dizziness. Will cause drowsiness. 30 tablet  0  . Omega-3 Fatty Acids (FISH OIL) 1000 MG CAPS Take 2 capsules by mouth daily.     . rosuvastatin (CRESTOR) 10 MG tablet Take 10 mg by mouth daily.  1  . traMADol (ULTRAM) 50 MG tablet Take 1 tablet (50 mg total) by mouth every 6 (six) hours as needed. 15 tablet 0  . vitamin B-12 (CYANOCOBALAMIN) 1000 MCG tablet Take 1,000 mcg by mouth daily.     No facility-administered medications prior to visit.     ROS Review of Systems  Constitutional: Negative for chills, fatigue and fever.  HENT: Positive for congestion, postnasal drip, rhinorrhea, sinus pressure and sinus pain. Negative for dental problem, ear pain, nosebleeds, trouble swallowing and voice change.   Eyes: Negative for photophobia and visual disturbance.  Respiratory: Positive for cough. Negative for chest tightness and wheezing.   Cardiovascular: Negative for chest pain, palpitations and leg swelling.  Gastrointestinal: Negative.   Musculoskeletal: Negative for arthralgias and myalgias.  Skin: Negative for color change and rash.  Neurological: Positive for light-headedness and headaches.  Hematological: Does not bruise/bleed easily.  Psychiatric/Behavioral: Negative.     Objective:  BP 138/76 (BP Location: Right Arm, Patient Position: Sitting, Cuff Size: Normal)  Pulse 69   Temp (!) 97.4 F (36.3 C) (Oral)   Ht 5\' 4"  (1.626 m)   Wt 112 lb 6 oz (51 kg)   SpO2 99%   BMI 19.29 kg/m   BP Readings from Last 3 Encounters:  04/04/17 138/76  03/30/17 128/84  11/16/16 (!) 155/76    Wt Readings from Last 3 Encounters:  04/04/17 112 lb 6 oz (51 kg)  03/30/17 111 lb 6 oz (50.5 kg)  11/16/16 114 lb (51.7 kg)    Physical Exam  Constitutional: She is oriented to person, place, and time. She appears well-developed and well-nourished. No distress.  HENT:  Head: Normocephalic and atraumatic.  Right Ear: External ear normal.  Left Ear: External ear normal.  Mouth/Throat: Oropharynx is clear and moist. No oropharyngeal  exudate.  Eyes: Conjunctivae are normal. Pupils are equal, round, and reactive to light. Right eye exhibits no discharge. Left eye exhibits no discharge. No scleral icterus.  Neck: Neck supple. No JVD present. No tracheal deviation present. No thyromegaly present.  Cardiovascular: Normal rate, regular rhythm and normal heart sounds.  Pulmonary/Chest: Effort normal and breath sounds normal. No stridor.  Abdominal: Bowel sounds are normal.  Lymphadenopathy:    She has no cervical adenopathy.  Neurological: She is alert and oriented to person, place, and time.  Skin: Skin is warm and dry. No rash noted. She is not diaphoretic.  Psychiatric: She has a normal mood and affect. Her behavior is normal.    Lab Results  Component Value Date   WBC 6.4 09/13/2016   HGB 11.2 (A) 09/13/2016   HCT 36 09/13/2016   PLT 239 09/13/2016   GLUCOSE 113 (H) 08/31/2016   CHOL 170 09/13/2016   TRIG 127 09/13/2016   HDL 50 09/13/2016   LDLCALC 95 09/13/2016   ALT 16 09/13/2016   AST 21 09/13/2016   NA 142 09/13/2016   K 4.5 09/13/2016   CL 102 08/31/2016   CREATININE 0.8 09/13/2016   BUN 19 09/13/2016   CO2 22 08/31/2016   TSH 0.835 05/18/2016   INR 1.01 04/22/2016   HGBA1C 6.5 08/31/2016    No results found.  Assessment & Plan:   Vergie was seen today for sinus problem.  Diagnoses and all orders for this visit:  Acute non-recurrent maxillary sinusitis -     amoxicillin (AMOXIL) 500 MG capsule; Take 1 capsule (500 mg total) by mouth 3 (three) times daily. -     fluticasone (FLONASE) 50 MCG/ACT nasal spray; Place 1 spray into both nostrils daily.  Chronic rhinitis -     fluticasone (FLONASE) 50 MCG/ACT nasal spray; Place 1 spray into both nostrils daily.   I am having [name] start on amoxicillin and fluticasone. I am also having her maintain her latanoprost, BETIMOL, aspirin, Calcium Carbonate-Vitamin D (CALCIUM 500 + D PO), LORazepam, ALPHAGAN P, azelastine, Fish Oil,  rosuvastatin, CAPSAICIN HOT PATCH EX, traMADol, vitamin B-12, meclizine, losartan, and ALPRAZolam.  Meds ordered this encounter  Medications  . amoxicillin (AMOXIL) 500 MG capsule    Sig: Take 1 capsule (500 mg total) by mouth 3 (three) times daily.    Dispense:  30 capsule    Refill:  0  . fluticasone (FLONASE) 50 MCG/ACT nasal spray    Sig: Place 1 spray into both nostrils daily.    Dispense:  16 g    Refill:  6     Follow-up: No Follow-up on file.  Mliss SaxWilliam Alfred Vashon Riordan, MD

## 2017-04-05 ENCOUNTER — Ambulatory Visit
Admission: RE | Admit: 2017-04-05 | Discharge: 2017-04-05 | Disposition: A | Discharge status: Home / Self Care | Payer: Medicare Other | Source: Ambulatory Visit | Attending: Specialist | Admitting: Specialist

## 2017-04-05 DIAGNOSIS — M542 Cervicalgia: Secondary | ICD-10-CM

## 2017-04-06 ENCOUNTER — Telehealth: Payer: Self-pay | Admitting: Family Medicine

## 2017-04-06 NOTE — Telephone Encounter (Signed)
Copied from CRM [id number]. Topic: Quick Communication - Rx Refill/Question >> Apr 06, 2017  3:58 PM Maia PettiesOrtiz, Kristie S wrote: Medication: amoxicillin - pt would like for the doctor to call in order for the tablets vs the capsules she received. She states she is feeling like she is getting a yeast infection and feels it is because of the capsule. She states she normally takes the tablets and has never had a problem before Preferred Pharmacy (with phone number or street name): [phone]- [place], [place] - [address] [phone] (Phone) [phone] (Fax)

## 2017-04-06 NOTE — Telephone Encounter (Signed)
Okay to change to tablets  °

## 2017-04-07 MED ORDER — AMOXICILLIN 500 MG PO TABS: 500.0000 mg | ORAL_TABLET | Freq: Three times a day (TID) | ORAL | 0 refills | Status: DC

## 2017-04-07 NOTE — Telephone Encounter (Signed)
Okay to change to tablets  °

## 2017-04-07 NOTE — Telephone Encounter (Signed)
Yes

## 2017-04-07 NOTE — Telephone Encounter (Signed)
Per Dr. Doreene BurkeKremer, okay to send in Amoxicillin 500 mg tablets. Rx sent in & patient is aware.

## 2017-04-07 NOTE — Telephone Encounter (Signed)
Patient checking status, please advise. Call back [phone]

## 2017-04-11 ENCOUNTER — Ambulatory Visit (INDEPENDENT_AMBULATORY_CARE_PROVIDER_SITE_OTHER): Payer: Medicare Other | Admitting: Family Medicine

## 2017-04-11 ENCOUNTER — Encounter: Payer: Self-pay | Admitting: Family Medicine

## 2017-04-11 VITALS — BP 112/80 | HR 65 | Ht 64.0 in | Wt 111.0 lb

## 2017-04-11 DIAGNOSIS — R1084 Generalized abdominal pain: Secondary | ICD-10-CM

## 2017-04-11 DIAGNOSIS — M545 Low back pain, unspecified: Secondary | ICD-10-CM | POA: Insufficient documentation

## 2017-04-11 DIAGNOSIS — Z1159 Encounter for screening for other viral diseases: Secondary | ICD-10-CM

## 2017-04-11 DIAGNOSIS — F329 Major depressive disorder, single episode, unspecified: Secondary | ICD-10-CM | POA: Diagnosis not present

## 2017-04-11 DIAGNOSIS — G8929 Other chronic pain: Secondary | ICD-10-CM | POA: Diagnosis not present

## 2017-04-11 LAB — URINALYSIS, ROUTINE W REFLEX MICROSCOPIC
BILIRUBIN URINE: NEGATIVE
KETONES UR: NEGATIVE
Leukocytes, UA: NEGATIVE
NITRITE: NEGATIVE
RBC / HPF: NONE SEEN (ref 0–?)
Specific Gravity, Urine: 1.01 (ref 1.000–1.030)
Total Protein, Urine: NEGATIVE
Urine Glucose: NEGATIVE
Urobilinogen, UA: 0.2 (ref 0.0–1.0)
pH: 6 (ref 5.0–8.0)

## 2017-04-11 LAB — AMYLASE: AMYLASE: 34 U/L (ref 27–131)

## 2017-04-11 LAB — COMPREHENSIVE METABOLIC PANEL
ALK PHOS: 79 U/L (ref 39–117)
ALT: 15 U/L (ref 0–35)
AST: 20 U/L (ref 0–37)
Albumin: 4.4 g/dL (ref 3.5–5.2)
BUN: 18 mg/dL (ref 6–23)
CHLORIDE: 102 meq/L (ref 96–112)
CO2: 32 mEq/L (ref 19–32)
Calcium: 9.9 mg/dL (ref 8.4–10.5)
Creatinine, Ser: 0.94 mg/dL (ref 0.40–1.20)
GFR: 62.23 mL/min (ref >60.00)
GLUCOSE: 113 mg/dL — AB (ref 70–99)
POTASSIUM: 4.6 meq/L (ref 3.5–5.1)
SODIUM: 140 meq/L (ref 135–145)
TOTAL PROTEIN: 6.9 g/dL (ref 6.0–8.3)
Total Bilirubin: 0.6 mg/dL (ref 0.2–1.2)

## 2017-04-11 LAB — CBC
HCT: 44.7 % (ref 36.0–46.0)
Hemoglobin: 14.6 g/dL (ref 12.0–15.0)
MCHC: 32.8 g/dL (ref 30.0–36.0)
MCV: 90.6 fl (ref 78.0–100.0)
Platelets: 224 10*3/uL (ref 150.0–400.0)
RBC: 4.94 Mil/uL (ref 3.87–5.11)
RDW: 13.3 % (ref 11.5–15.5)
WBC: 6.2 10*3/uL (ref 4.0–10.5)

## 2017-04-11 MED ORDER — PAROXETINE HCL 10 MG PO TABS: 10.0000 mg | ORAL_TABLET | Freq: Every day | ORAL | 0 refills | Status: DC

## 2017-04-11 NOTE — Progress Notes (Signed)
Subjective:  Patient ID: Stephanie Henson, female    DOB: 21-Sep-1945  Age: 72 y.o. MRN: [id number]  CC: go over medications   HPI Stephanie Henson presents for evaluation of multiple multiple medical issues.  She tells of a 2-week history of intermittent abdominal pain.  Pain is generalized and moves around her abdomen.  Currently she is feeling it in the right lower quadrant.  She denies nausea vomiting or reflux.  She denies weight change with it.  She denies melena or blood in her stool.  She stools daily and her stools have been normal.  She had a Colo guard 2 years ago that was normal.  Her last Pap smear was last month and it was normal she tells me.  She has had no abdominal surgeries.  She tells of chronic lower back pain since her surgery some years ago.  She tries to use ULTRAM for this sparingly.  She does not tolerate ibuprofen for this.  Patient said that she is willing to try something else for her nerves other than the lorazepam.  When I asked her about sadness she welled up and started to cry a little bit said that she did not want to talk about it.  I asked her how long she has been sad and she said for a few years now.  She is married and lives with her husband.  All of her children are grown and she has several grandchildren.  Her children live locally.  She is retired from working at a [employer].  When I asked her about how things were going with her husband she broke down and cried and told me that she feels like he is probably having an affair.  Their sex life is nonexistant and he comes home wearing clothing that she did not by for him.  She continues to have a normal libido.  Outpatient Medications Prior to Visit  Medication Sig Dispense Refill  . ALPHAGAN P 0.1 % SOLN INSTILL 1 DROP IN BOTH EYES THREE TIMES A DAY  99  . ALPRAZolam (XANAX) 0.25 MG tablet Take one at the MRI and may repeat x 1. For anxiety 2 tablet 0  . amoxicillin (AMOXIL) 500 MG tablet Take 1 tablet (500 mg  total) by mouth 3 (three) times daily. 30 tablet 0  . aspirin 81 MG tablet Take 81 mg by mouth daily.    Marland Kitchen. azelastine (ASTELIN) 0.1 % nasal spray USE 2 SPRAYS INTO EACH NOSTRIL TWICE A DAY  0  . BETIMOL 0.5 % ophthalmic solution Place 1 drop into both eyes daily. Each eye once per day    . CAPSAICIN HOT PATCH EX Apply 1 patch topically daily as needed (pain).    . fluticasone (FLONASE) 50 MCG/ACT nasal spray Place 1 spray into both nostrils daily. 16 g 6  . latanoprost (XALATAN) 0.005 % ophthalmic solution Place 1 drop into both eyes at bedtime.     Marland Kitchen. LORazepam (ATIVAN) 0.5 MG tablet Take 0.5 mg by mouth 2 (two) times daily.    . meclizine (ANTIVERT) 25 MG tablet Take 1/2-1 pill every 6 or 8 hours as needed for dizziness. Will cause drowsiness. 30 tablet 0  . Omega-3 Fatty Acids (FISH OIL) 1000 MG CAPS Take 2 capsules by mouth daily.     . rosuvastatin (CRESTOR) 10 MG tablet Take 10 mg by mouth daily.  1  . traMADol (ULTRAM) 50 MG tablet Take 1 tablet (50 mg total) by mouth every  6 (six) hours as needed. 15 tablet 0  . vitamin B-12 (CYANOCOBALAMIN) 1000 MCG tablet Take 1,000 mcg by mouth daily.    . Calcium Carbonate-Vitamin D (CALCIUM 500 + D PO) Take 1 tablet by mouth daily.    Marland Kitchen losartan (COZAAR) 50 MG tablet Take 1 tablet (50 mg total) by mouth daily. Needs office visit for any additional refills 30 tablet 0   No facility-administered medications prior to visit.     ROS Review of Systems  Constitutional: Negative.   HENT: Positive for postnasal drip and rhinorrhea.   Eyes: Negative for photophobia and visual disturbance.  Respiratory: Negative for chest tightness and shortness of breath.   Cardiovascular: Negative for chest pain and leg swelling.  Gastrointestinal: Positive for abdominal pain. Negative for constipation, nausea and vomiting.  Endocrine: Negative for polyphagia and polyuria.  Genitourinary: Negative for difficulty urinating, frequency, hematuria and vaginal discharge.    Musculoskeletal: Positive for back pain.  Skin: Negative for rash.  Allergic/Immunologic: Negative for immunocompromised state.  Neurological: Negative for weakness and headaches.  Hematological: Does not bruise/bleed easily.  Psychiatric/Behavioral: Positive for dysphoric mood. Negative for behavioral problems, self-injury and suicidal ideas. The patient is nervous/anxious.     Objective:  BP 112/80 (BP Location: Right Arm, Patient Position: Sitting, Cuff Size: Normal)   Pulse 65   Ht 5\' 4"  (1.626 m)   Wt 111 lb (50.3 kg)   SpO2 98%   BMI 19.05 kg/m   BP Readings from Last 3 Encounters:  04/11/17 112/80  04/04/17 138/76  03/30/17 128/84    Wt Readings from Last 3 Encounters:  04/11/17 111 lb (50.3 kg)  04/04/17 112 lb 6 oz (51 kg)  03/30/17 111 lb 6 oz (50.5 kg)    Physical Exam  Constitutional: She is oriented to person, place, and time. She appears well-developed and well-nourished. No distress.  HENT:  Head: Normocephalic and atraumatic.  Right Ear: External ear normal.  Left Ear: External ear normal.  Mouth/Throat: Oropharynx is clear and moist. No oropharyngeal exudate.  Eyes: Conjunctivae are normal. Pupils are equal, round, and reactive to light. Right eye exhibits no discharge. Left eye exhibits no discharge. No scleral icterus.  Neck: Neck supple. No JVD present. No tracheal deviation present. No thyromegaly present.  Cardiovascular: Normal rate, regular rhythm and normal heart sounds.  Pulmonary/Chest: Effort normal and breath sounds normal. No stridor.  Abdominal: Soft. Bowel sounds are normal. She exhibits no distension and no mass. There is no rebound and no guarding.  Musculoskeletal:       Lumbar back: She exhibits decreased range of motion. She exhibits no tenderness, no bony tenderness and no deformity.  Lymphadenopathy:    She has no cervical adenopathy.  Neurological: She is alert and oriented to person, place, and time. She has normal strength.   Reflex Scores:      Patellar reflexes are 2+ on the right side and 2+ on the left side.      Achilles reflexes are 1+ on the right side and 1+ on the left side. Neg dural tension signs.  Skin: Skin is warm and dry. She is not diaphoretic.  Psychiatric: She has a normal mood and affect. Her behavior is normal.    Lab Results  Component Value Date   WBC 6.4 09/13/2016   HGB 11.2 (A) 09/13/2016   HCT 36 09/13/2016   PLT 239 09/13/2016   GLUCOSE 113 (H) 08/31/2016   CHOL 170 09/13/2016   TRIG 127 09/13/2016  HDL 50 09/13/2016   LDLCALC 95 09/13/2016   ALT 16 09/13/2016   AST 21 09/13/2016   NA 142 09/13/2016   K 4.5 09/13/2016   CL 102 08/31/2016   CREATININE 0.8 09/13/2016   BUN 19 09/13/2016   CO2 22 08/31/2016   TSH 0.835 05/18/2016   INR 1.01 04/22/2016   HGBA1C 6.5 08/31/2016    Mr Cervical Spine W/o Contrast  Result Date: 04/05/2017 CLINICAL DATA:  Neck and bilateral shoulder pain for over 20 years. History of prior cervical surgery. EXAM: MRI CERVICAL SPINE WITHOUT CONTRAST TECHNIQUE: Multiplanar, multisequence MR imaging of the cervical spine was performed. No intravenous contrast was administered. COMPARISON:  Plain film cervical spine from [facility] 09/27/2016. FINDINGS: Alignment: Maintained. Vertebrae: No fracture or worrisome lesion. The patient is status post C5-7 ACDF. Cord: Normal signal throughout. Posterior Fossa, vertebral arteries, paraspinal tissues: Negative. Disc levels: C2-3: Minimal bulge without stenosis. Mild facet arthropathy bilaterally. C3-4: Mild-to-moderate facet degenerative disease is worse on the left. Shallow central protrusion is identified. No stenosis. C4-5: Mild-to-moderate facet degenerative disease is worse on the left. There is mild disc bulging and uncovertebral spurring. The central canal and foramina are open. C5-6: Status post discectomy and fusion. The central canal and foramina are widely patent. C6-7: Status post discectomy  and fusion. The central canal and foramina are widely patent. C7-T1: There is some facet degenerative disease. This level is otherwise negative. IMPRESSION: Status post C5-7 ACDF without evidence of complication. The central canal and foramina are widely patent at both levels. Small central protrusion at C3-4 and left worse than right facet degenerative disease without stenosis. Minimal disc bulge and uncovertebral disease C4-5 without central canal or foraminal stenosis. Mild to moderate facet degenerative disease is worse on the left at this level. Electronically Signed   By: Drusilla Kanner M.D.   On: 04/05/2017 14:59    Assessment & Plan:   Kaelea was seen today for go over medications.  Diagnoses and all orders for this visit:  Reactive depression -     PARoxetine (PAXIL) 10 MG tablet; Take 1 tablet (10 mg total) by mouth daily. -     Ambulatory referral to Psychology  Chronic midline low back pain without sciatica  Generalized abdominal pain -     CBC -     Amylase -     Comprehensive metabolic panel -     Urinalysis, Routine w reflex microscopic  Encounter for hepatitis C screening test for low risk patient -     Hepatitis C antibody   I have discontinued Silver C. Simko's Calcium Carbonate-Vitamin D (CALCIUM 500 + D PO) and losartan. I am also having her start on PARoxetine. Additionally, I am having her maintain her latanoprost, BETIMOL, aspirin, LORazepam, ALPHAGAN P, azelastine, Fish Oil, rosuvastatin, CAPSAICIN HOT PATCH EX, traMADol, vitamin B-12, meclizine, ALPRAZolam, fluticasone, and amoxicillin.  Meds ordered this encounter  Medications  . PARoxetine (PAXIL) 10 MG tablet    Sig: Take 1 tablet (10 mg total) by mouth daily.    Dispense:  30 tablet    Refill:  0     Follow-up: Return in about 1 month (around 05/09/2017).  Mliss Sax, MD

## 2017-04-12 ENCOUNTER — Encounter: Payer: Self-pay | Admitting: Family Medicine

## 2017-04-12 LAB — HEPATITIS C ANTIBODY
HEP C AB: NONREACTIVE
SIGNAL TO CUT-OFF: 0.02 (ref <1.00)

## 2017-04-14 ENCOUNTER — Other Ambulatory Visit: Payer: Self-pay | Admitting: Family Medicine

## 2017-04-14 MED ORDER — TRAMADOL HCL 50 MG PO TABS: 50.0000 mg | ORAL_TABLET | Freq: Four times a day (QID) | ORAL | 0 refills | Status: DC | PRN

## 2017-04-14 NOTE — Telephone Encounter (Signed)
Copied from CRM [id number]. Topic: Quick Communication - See Telephone Encounter >> Apr 14, 2017  1:51 PM Cipriano BunkerLambe, Annette S wrote: CRM for notification. See Telephone encounter for:   Pt. Called said spoke to dr. Last week at appt. About pain medicine.Marland Kitchen.   She would like to continue on Tramadol, but if not please let her know.    She tried Paxil today and will let dr. Ashley MarinerKnow how it works.    [id number] [address]- [place], [place] - [phone] [facility] [address] [facility] [address] [place] [address] Phone: [phone] Fax: [phone]   04/14/17.

## 2017-04-14 NOTE — Telephone Encounter (Signed)
Rx has been sent in. 

## 2017-04-14 NOTE — Telephone Encounter (Signed)
Tramadol LOV 04/11/17 Dr. D[facility]ene BurkeKremer [facility]

## 2017-04-17 ENCOUNTER — Telehealth: Payer: Self-pay | Admitting: Family Medicine

## 2017-04-17 NOTE — Telephone Encounter (Signed)
Copied from CRM [phone]. Topic: Quick Communication - Rx Refill/Question >> Apr 17, 2017  9:00 AM Lelon FrohlichGolden, Tashia, RMA wrote: Medication: lorazepam 0.5 mg   Has the patient contacted their pharmacy? Yes   (Agent: If no, request that the patient contact the pharmacy for the refill.)   Preferred Pharmacy (with phone number [facility] street [facility] [address]  Pt also requesting a call from Dr. Doreene BurkeKremer  Agent: Please be advised that RX refills may take up to 3 business days. We ask that you follow-up with your pharmacy.

## 2017-04-18 MED ORDER — LORAZEPAM 0.5 MG PO TABS: 0.5000 mg | ORAL_TABLET | Freq: Two times a day (BID) | ORAL | 0 refills | Status: DC

## 2017-04-18 NOTE — Telephone Encounter (Signed)
Spoke with patient who is requesting a refill of her Lorazepam. She said that she could not tolerate the Paxil because it caused indigestion. I expressed my concern about her risk for falls with two sedating meds. She said that she rarely takes the ultram. I am okaying a month supply of lorazepam bid. I asked to try to take it only as needed. She said that she would try.

## 2017-04-18 NOTE — Telephone Encounter (Signed)
Rx phoned in.   

## 2017-04-24 ENCOUNTER — Other Ambulatory Visit: Payer: Self-pay | Admitting: Family Medicine

## 2017-05-02 ENCOUNTER — Other Ambulatory Visit: Payer: Self-pay | Admitting: Family Medicine

## 2017-05-11 ENCOUNTER — Ambulatory Visit (INDEPENDENT_AMBULATORY_CARE_PROVIDER_SITE_OTHER): Payer: Medicare Other | Admitting: Specialist

## 2017-05-11 ENCOUNTER — Encounter (INDEPENDENT_AMBULATORY_CARE_PROVIDER_SITE_OTHER): Payer: Self-pay | Admitting: Specialist

## 2017-05-11 VITALS — BP 178/83 | Ht 64.0 in | Wt 112.0 lb

## 2017-05-11 DIAGNOSIS — M4712 Other spondylosis with myelopathy, cervical region: Secondary | ICD-10-CM | POA: Diagnosis not present

## 2017-05-11 DIAGNOSIS — M4322 Fusion of spine, cervical region: Secondary | ICD-10-CM

## 2017-05-11 MED ORDER — TRAMADOL HCL 50 MG PO TABS: 50.0000 mg | ORAL_TABLET | Freq: Four times a day (QID) | ORAL | 1 refills | Status: DC | PRN

## 2017-05-11 NOTE — Progress Notes (Signed)
Office Visit Note   Patient: Stephanie Henson           Date of Birth: 28-Jun-1945           MRN: [id number] Visit Date: 05/11/2017              Requested by: Lupita RaiderShaw, Kimberlee, MD [address] [place], [place] [address] PCP: Mliss SaxKremer, William Alfred, MD   Assessment & Plan: Visit Diagnoses:  1. Other spondylosis with myelopathy, cervical region   2. Cervical vertebral fusion     Plan: Avoid overhead lifting and overhead use of the arms. Do not lift greater than 5 lbs. Adjust head rest in vehicle to prevent hyperextension if rear ended. Hot showers or ice intermittantly are helpful Biofreeze can help with the pain. Some patients have found relief with the use of Hemp Oil CDB oil.  Follow-Up Instructions: Return in about 3 weeks (around 06/01/2017).   Orders:  Orders Placed This Encounter  Procedures  . Ambulatory referral to Physical Medicine Rehab   No orders of the defined types were placed in this encounter.     Procedures: No procedures performed   Clinical Data: No additional findings.   Subjective: Chief Complaint  Patient presents with  . Results    HPI  Review of Systems   Objective: Vital Signs: BP (!) 178/83   Ht 5\' 4"  (1.626 m)   Wt 112 lb (50.8 kg)   BMI 19.22 kg/m   Physical Exam  Ortho Exam  Specialty Comments:  No specialty comments available.  Imaging: No results found.   PMFS History: Patient Active Problem List   Diagnosis Date Noted  . 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  . Chronic midline low back pain without sciatica 04/11/2017  . Generalized abdominal pain 04/11/2017  . Acute non-recurrent maxillary sinusitis 04/04/2017  . Arthritis 03/30/2017  . Anxiety 03/30/2017  . Pre-diabetes 08/31/2016  . Systolic hypertension 08/31/2016  . Coronary artery calcification seen on CAT scan 05/18/2016  . Essential hypertension 05/18/2016  . Type 2 diabetes mellitus without complication (HCC) 05/18/2016  . HLD  (hyperlipidemia) 05/18/2016  . Plantar fasciitis 03/25/2016  . Anemia 10/12/2015  . Glaucoma 09/16/2015  . Pulmonary nodules 12/26/2012  . Chronic rhinitis 12/26/2012   Past Medical History:  Diagnosis Date  . Anxiety   . Cervicalgia   . CKD (chronic kidney disease) stage 3, GFR 30-59 ml/min (HCC)   . Coronary artery calcification seen on CAT scan 05/18/2016   Myoview 7/18: EF 67, no ischemia; Low Risk   . Diabetes mellitus (HCC)   . Environmental allergies   . FH: colonic polyps   . Fibromyalgia   . GERD (gastroesophageal reflux disease)   . Goiter   . Hematuria   . Hyperlipidemia   . Hypertension   . Osteoporosis   . Solitary pulmonary nodule     ;RUL - 7mm on CT 04/2014; CT 10/2014 resolution of nodule on RUL however persistant smaller nodules throughouts the lungs  . Weight loss     Family History  Problem Relation Age of Onset  . Heart disease Father   . Heart disease Brother   . Lung cancer Sister        smoked    Past Surgical History:  Procedure Laterality Date  . BACK SURGERY    . NASAL SINUS SURGERY    . NECK SURGERY     Social History   Occupational History  . Not on file  Tobacco Use  .  Smoking status: Never Smoker  . Smokeless tobacco: Never Used  Substance and Sexual Activity  . Alcohol use: No  . Drug use: No  . Sexual activity: Not on file

## 2017-05-11 NOTE — Patient Instructions (Signed)
Avoid overhead lifting and overhead use of the arms. Do not lift greater than 5 lbs. Adjust head rest in vehicle to prevent hyperextension if rear ended. Hot showers or ice intermittantly are helpful Biofreeze can help with the pain. Some patients have found relief with the use of Hemp Oil CDB oil.

## 2017-05-15 ENCOUNTER — Telehealth (INDEPENDENT_AMBULATORY_CARE_PROVIDER_SITE_OTHER): Payer: Self-pay | Admitting: Specialist

## 2017-05-15 NOTE — Telephone Encounter (Signed)
Patient left a voicemail saying hemp oil is not working for her and that she needs 60 quantity of the tramadol instead of 30 because she takes 2 a day and they help with all of her pain. Please advise # [phone]

## 2017-05-16 ENCOUNTER — Telehealth: Payer: Self-pay | Admitting: Family Medicine

## 2017-05-16 ENCOUNTER — Telehealth: Payer: Self-pay

## 2017-05-16 NOTE — Telephone Encounter (Signed)
Pt needs to rtc.  

## 2017-05-16 NOTE — Telephone Encounter (Signed)
Copied from CRM [phone]. Topic: Quick Communication - See Telephone Encounter >> May 16, 2017  7:56 AM [name], [name] wrote: CRM for notification. See Telephone encounter for: 05/16/17. Pt states that she needs 60 tablets of tramadol. That she is in pain, she does not have side effects from the medicine.  She would like to have a call today with an answer whether or not you will prescribe 60 pills.  Pt says she may have to find a new provider if she continues to be in pain. Cb is [phone]. Please leave answer on answering machine if no answer

## 2017-05-16 NOTE — Telephone Encounter (Signed)
Please help patient get on the schedule to see Dr. Doreene BurkeKremer.    Thank you guys!

## 2017-05-16 NOTE — Telephone Encounter (Signed)
Copied from CRM [phone]. Topic: Quick Communication - See Telephone Encounter >> May 16, 2017  7:56 AM [name], [name] wrote: CRM for notification. See Telephone encounter for: 05/16/17. Pt states that she needs 60 tablets of tramadol. That she is in pain, she does not have side effects from the medicine.  She would like to have a call today with an answer whether or not you will prescribe 60 pills.  Pt says she may have to find a new provider if she continues to be in pain. Cb is [phone]. Please leave answer on answering machine if no answer  >> May 16, 2017  8:23 AM Louie BunPalacios Medina, Rosey Batheresa D wrote: Patient called and said that if she needs to be reached, office can also call her cell phone [phone].

## 2017-05-16 NOTE — Telephone Encounter (Signed)
Patient needs appointment with Dr. Doreene BurkeKremer, message already sent to front staff for patient to be scheduled.

## 2017-05-17 ENCOUNTER — Ambulatory Visit (INDEPENDENT_AMBULATORY_CARE_PROVIDER_SITE_OTHER): Payer: Medicare Other | Admitting: Family Medicine

## 2017-05-17 ENCOUNTER — Encounter: Payer: Self-pay | Admitting: Family Medicine

## 2017-05-17 ENCOUNTER — Ambulatory Visit: Payer: Medicare Other | Admitting: Family Medicine

## 2017-05-17 VITALS — BP 132/74 | HR 62 | Ht 64.0 in | Wt 109.5 lb

## 2017-05-17 DIAGNOSIS — M1990 Unspecified osteoarthritis, unspecified site: Secondary | ICD-10-CM | POA: Diagnosis not present

## 2017-05-17 DIAGNOSIS — F419 Anxiety disorder, unspecified: Secondary | ICD-10-CM

## 2017-05-17 DIAGNOSIS — T887XXA Unspecified adverse effect of drug or medicament, initial encounter: Secondary | ICD-10-CM | POA: Diagnosis not present

## 2017-05-17 NOTE — Telephone Encounter (Signed)
I called and advised patient of Dr. Barbaraann FasterNitka's Message

## 2017-05-17 NOTE — Progress Notes (Addendum)
Subjective:  Patient ID: Stephanie Henson, female    DOB: 12/16/1945  Age: 72 y.o. MRN: [id number]  CC: medication follow up   HPI Stephanie Henson presents for follow up of her anxiety and arthritis.  Once again I expressed my concern about taking ativan and tramadol together. I also expressed concern that she was also receiving tramadol from other providers. However, she did say that she had decreased her ativan usage to once a day.  She also told me that she did not understand why I wanted to try other medicines for her. Once again I expressed my concern about the safety of taking ativan and tramadol together on an ongoing basis, much less someone at her age and stage of life. When I asked her about the paxil, she said that she was still having nausea from the one pill that she had taken over a month ago.   Outpatient Medications Prior to Visit  Medication Sig Dispense Refill  . ALPHAGAN P 0.1 % SOLN INSTILL 1 DROP IN BOTH EYES THREE TIMES A DAY  99  . aspirin 81 MG tablet Take 81 mg by mouth daily.    Marland Kitchen azelastine (ASTELIN) 0.1 % nasal spray USE 2 SPRAYS INTO EACH NOSTRIL TWICE A DAY  0  . BETIMOL 0.5 % ophthalmic solution Place 1 drop into both eyes daily. Each eye once per day    . CAPSAICIN HOT PATCH EX Apply 1 patch topically daily as needed (pain).    . fluticasone (FLONASE) 50 MCG/ACT nasal spray Place 1 spray into both nostrils daily. 16 g 6  . latanoprost (XALATAN) 0.005 % ophthalmic solution Place 1 drop into both eyes at bedtime.     Marland Kitchen LORazepam (ATIVAN) 0.5 MG tablet Take 1 tablet (0.5 mg total) by mouth 2 (two) times daily. 60 tablet 0  . Omega-3 Fatty Acids (FISH OIL) 1000 MG CAPS Take 2 capsules by mouth daily.     . rosuvastatin (CRESTOR) 10 MG tablet Take 10 mg by mouth daily.  1  . traMADol (ULTRAM) 50 MG tablet Take 1 tablet (50 mg total) by mouth every 6 (six) hours as needed. 30 tablet 1  . vitamin B-12 (CYANOCOBALAMIN) 1000 MCG tablet Take 1,000 mcg by mouth daily.      . meclizine (ANTIVERT) 25 MG tablet Take 1/2-1 pill every 6 or 8 hours as needed for dizziness. Will cause drowsiness. (Patient not taking: Reported on 05/11/2017) 30 tablet 0  . PARoxetine (PAXIL) 10 MG tablet Take 1 tablet (10 mg total) by mouth daily. (Patient not taking: Reported on 05/11/2017) 30 tablet 0  . traMADol (ULTRAM) 50 MG tablet TAKE 1 TABLET (50 MG TOTAL) BY MOUTH EVERY 6 (SIX) HOURS AS NEEDED. 15 tablet 0   No facility-administered medications prior to visit.     ROS Review of Systems  Constitutional: Negative.   Musculoskeletal: Positive for back pain, neck pain and neck stiffness.    Objective:  BP 132/74 (BP Location: Left Arm, Patient Position: Sitting, Cuff Size: Normal)   Pulse 62   Ht 5\' 4"  (1.626 m)   Wt 109 lb 8 oz (49.7 kg)   SpO2 98%   BMI 18.80 kg/m   BP Readings from Last 3 Encounters:  05/17/17 132/74  05/11/17 (!) 178/83  04/11/17 112/80    Wt Readings from Last 3 Encounters:  05/17/17 109 lb 8 oz (49.7 kg)  05/11/17 112 lb (50.8 kg)  04/11/17 111 lb (50.3 kg)    Physical  Exam  Constitutional: She is oriented to person, place, and time. She appears well-developed and well-nourished. No distress.  HENT:  Head: Normocephalic and atraumatic.  Right Ear: External ear normal.  Left Ear: External ear normal.  Eyes: Right eye exhibits no discharge. Left eye exhibits no discharge. No scleral icterus.  Neurological: She is alert and oriented to person, place, and time.  Skin: Skin is warm and dry. She is not diaphoretic.  Psychiatric: Her affect is angry, labile and inappropriate. She is agitated.  Pt moved quickly from anger to crying to a more normal mood.         Lab Results  Component Value Date   WBC 6.2 04/11/2017   HGB 14.6 04/11/2017   HCT 44.7 04/11/2017   PLT 224.0 04/11/2017   GLUCOSE 113 (H) 04/11/2017   CHOL 170 09/13/2016   TRIG 127 09/13/2016   HDL 50 09/13/2016   LDLCALC 95 09/13/2016   ALT 15 04/11/2017   AST 20  04/11/2017   NA 140 04/11/2017   K 4.6 04/11/2017   CL 102 04/11/2017   CREATININE 0.94 04/11/2017   BUN 18 04/11/2017   CO2 32 04/11/2017   TSH 0.835 05/18/2016   INR 1.01 04/22/2016   HGBA1C 6.5 08/31/2016    Mr Cervical Spine W/o Contrast  Result Date: 04/05/2017 CLINICAL DATA:  Neck and bilateral shoulder pain for over 20 years. History of prior cervical surgery. EXAM: MRI CERVICAL SPINE WITHOUT CONTRAST TECHNIQUE: Multiplanar, multisequence MR imaging of the cervical spine was performed. No intravenous contrast was administered. COMPARISON:  Plain [facility] cervical spine [facility] 09/27/2016. FINDINGS: Alignment: Maintained. Vertebrae: No fracture or worrisome lesion. The patient is status post C5-7 ACDF. Cord: Normal signal throughout. Posterior Fossa, vertebral arteries, paraspinal tissues: Negative. Disc levels: C2-3: Minimal bulge without stenosis. Mild facet arthropathy bilaterally. C3-4: Mild-to-moderate facet degenerative disease is worse on the left. Shallow central protrusion is identified. No stenosis. C4-5: Mild-to-moderate facet degenerative disease is worse on the left. There is mild disc bulging and uncovertebral spurring. The central canal and foramina are open. C5-6: Status post discectomy and fusion. The central canal and foramina are widely patent. C6-7: Status post discectomy and fusion. The central canal and foramina are widely patent. C7-T1: There is some facet degenerative disease. This level is otherwise negative. IMPRESSION: Status post C5-7 ACDF without evidence of complication. The central canal and foramina are widely patent at both levels. Small central protrusion at C3-4 and left worse than right facet degenerative disease without stenosis. Minimal disc bulge and uncovertebral disease C4-5 without central canal or foraminal stenosis. Mild to moderate facet degenerative disease is worse on the left at this level. Electronically Signed   By: Drusilla Kannerhomas  Dalessio  M.D.   On: 04/05/2017 14:59    Assessment & Plan:   Lewanda RifeClara was seen today for medication follow up.  Diagnoses and all orders for this visit:  Arthritis  Anxiety  Medication side effect   I am having Stephanie Henson maintain her latanoprost, BETIMOL, aspirin, ALPHAGAN P, azelastine, Fish Oil, rosuvastatin, CAPSAICIN HOT PATCH EX, vitamin B-12, meclizine, fluticasone, PARoxetine, LORazepam, and traMADol.  No orders of the defined types were placed in this encounter.  Patient was clearly not interested in trying other medications for her anxiety. She said that she hoped that "I could live with myself for not treating her pain". She said that "I didn't like her from the first time that I saw her".   I told her that we  were discussing the ativan at this point and I wanted her to at least try other medicines. She then asked me about side effects of other medicines, and I said that it would depend on the medicine. She then became angry and left the room.  I will be happy to see her back when she is ready.  Follow-up: Return if symptoms worsen or fail to improve.  Mliss Sax, MD

## 2017-05-17 NOTE — Telephone Encounter (Signed)
Patient left a voicemail saying hemp oil is not working for her and that she needs 60 quantity of the tramadol instead of 30 because she takes 2 a day and they help with all of her pain.-------Please advise

## 2017-05-17 NOTE — Telephone Encounter (Signed)
I am unable to fill narcotics for a month at a time and she will need to call back for refills with visits to ensure the pain meds are being used properly. This is the law and is due to the opioid crisis, I can prescribe 30 tablets at a time. jen

## 2017-05-18 ENCOUNTER — Other Ambulatory Visit: Payer: Self-pay

## 2017-05-18 ENCOUNTER — Ambulatory Visit (INDEPENDENT_AMBULATORY_CARE_PROVIDER_SITE_OTHER): Payer: Medicare Other | Admitting: Family Medicine

## 2017-05-18 ENCOUNTER — Encounter: Payer: Self-pay | Admitting: Family Medicine

## 2017-05-18 VITALS — BP 122/70 | HR 76 | Temp 98.6°F | Resp 17 | Ht 64.0 in | Wt 108.8 lb

## 2017-05-18 DIAGNOSIS — J069 Acute upper respiratory infection, unspecified: Secondary | ICD-10-CM

## 2017-05-18 DIAGNOSIS — K219 Gastro-esophageal reflux disease without esophagitis: Secondary | ICD-10-CM | POA: Diagnosis not present

## 2017-05-18 DIAGNOSIS — R6889 Other general symptoms and signs: Secondary | ICD-10-CM | POA: Diagnosis not present

## 2017-05-18 DIAGNOSIS — R0989 Other specified symptoms and signs involving the circulatory and respiratory systems: Secondary | ICD-10-CM

## 2017-05-18 MED ORDER — OMEPRAZOLE 20 MG PO CPDR: 20.0000 mg | DELAYED_RELEASE_CAPSULE | Freq: Every day | ORAL | 3 refills | Status: DC

## 2017-05-18 NOTE — Patient Instructions (Addendum)
     IF you received an x-ray today, you will receive an invoice from [facility]. Please contact [facility] at [phone] with questions or concerns regarding your invoice.   IF you received labwork today, you will receive an invoice from [facility]. Please contact [facility] at [phone] with questions or concerns regarding your invoice.   Our billing staff will not be able to assist you with questions regarding bills from these companies.  You will be contacted with the lab results as soon as they are available. The fastest way to get your results is to activate your My Chart account. Instructions are located on the last page of this paperwork. If you have not heard from us regarding the results in 2 weeks, please contact this office.      Food Choices for Gastroesophageal Reflux Disease, Adult When you have gastroesophageal reflux disease (GERD), the foods you eat and your eating habits are very important. Choosing the right foods can help ease your discomfort. What guidelines do I need to follow?  Choose fruits, vegetables, whole grains, and low-fat dairy products.  Choose low-fat meat, fish, and poultry.  Limit fats such as oils, salad dressings, butter, nuts, and avocado.  Keep a food diary. This helps you identify foods that cause symptoms.  Avoid foods that cause symptoms. These may be different for everyone.  Eat small meals often instead of 3 large meals a day.  Eat your meals slowly, in a place where you are relaxed.  Limit fried foods.  Cook foods using methods other than frying.  Avoid drinking alcohol.  Avoid drinking large amounts of liquids with your meals.  Avoid bending over or lying down until 2-3 hours after eating. What foods are not recommended? These are some foods and drinks that may make your symptoms worse: Vegetables Tomatoes. Tomato juice. Tomato and spaghetti sauce. Chili peppers. Onion and garlic.  Horseradish. Fruits Oranges, grapefruit, and lemon (fruit and juice). Meats High-fat meats, fish, and poultry. This includes hot dogs, ribs, ham, sausage, salami, and bacon. Dairy Whole milk and chocolate milk. Sour cream. Cream. Butter. Ice cream. Cream cheese. Drinks Coffee and tea. Bubbly (carbonated) drinks or energy drinks. Condiments Hot sauce. Barbecue sauce. Sweets/Desserts Chocolate and cocoa. Donuts. Peppermint and spearmint. Fats and Oils High-fat foods. This includes French fries and potato chips. Other Vinegar. Strong spices. This includes black pepper, white pepper, red pepper, cayenne, curry powder, cloves, ginger, and chili powder. The items listed above may not be a complete list of foods and drinks to avoid. Contact your dietitian for more information. This information is not intended to replace advice given to you by your health care provider. Make sure you discuss any questions you have with your health care provider. Document Released: 08/09/2011 Document Revised: 07/16/2015 Document Reviewed: 12/12/2012 Elsevier Interactive Patient Education  2017 [employer]  

## 2017-05-18 NOTE — Progress Notes (Signed)
Chief Complaint  Patient presents with  . Sinusitis    intermittent x couple month, pressure over eyes, congestion she can feel when she swallows, ears popping.  Intermittent clear drainage, tried otc allergy pill (walgreen brand) did help a little, loses her voice sometimes and constantly clearing throat  . Medication Refill    pt would like  medication for acid reflux    HPI  Sinus  Pt with frontal headaches Sore throat and congestion She has to clear her throat all the time She states that at night her throat feels raw but in the day time her throat feels fine She stats that she is coughing and clearing her throat as well She reports that her throat clearing started after her sinus infection January 2019  She states that that her symptoms recurred 3 weeks ago  She denies fevers or chills She reports tinnitus since 2004   GERD  She reports that she changed her PCP  She states that her lorazepam was changed to PAXIL which gave her reflux from her epigastrium to her throat Pepcid AC did not help Liquid antacids did not help She has not tried taking any other reflux meds She only took 1 PAXIL and discarded the rest She has not taken anything for a month    Past Medical History:  Diagnosis Date  . Anxiety   . Cervicalgia   . CKD (chronic kidney disease) stage 3, GFR 30-59 ml/min (HCC)   . Coronary artery calcification seen on CAT scan 05/18/2016   Myoview 7/18: EF 67, no ischemia; Low Risk   . Diabetes mellitus (HCC)   . Environmental allergies   . FH: colonic polyps   . Fibromyalgia   . GERD (gastroesophageal reflux disease)   . Goiter   . Hematuria   . Hyperlipidemia   . Hypertension   . Osteoporosis   . Solitary pulmonary nodule     ;RUL - 7mm on CT 04/2014; CT 10/2014 resolution of nodule on RUL however persistant smaller nodules throughouts the lungs  . Weight loss     Current Outpatient Medications  Medication Sig Dispense Refill  . ALPHAGAN P 0.1 % SOLN  INSTILL 1 DROP IN BOTH EYES THREE TIMES A DAY  99  . aspirin 81 MG tablet Take 81 mg by mouth daily.    Marland Kitchen azelastine (ASTELIN) 0.1 % nasal spray USE 2 SPRAYS INTO EACH NOSTRIL TWICE A DAY  0  . BETIMOL 0.5 % ophthalmic solution Place 1 drop into both eyes daily. Each eye once per day    . CAPSAICIN HOT PATCH EX Apply 1 patch topically daily as needed (pain).    . fluticasone (FLONASE) 50 MCG/ACT nasal spray Place 1 spray into both nostrils daily. 16 g 6  . latanoprost (XALATAN) 0.005 % ophthalmic solution Place 1 drop into both eyes at bedtime.     Marland Kitchen LORazepam (ATIVAN) 0.5 MG tablet Take 1 tablet (0.5 mg total) by mouth 2 (two) times daily. 60 tablet 0  . meclizine (ANTIVERT) 25 MG tablet Take 1/2-1 pill every 6 or 8 hours as needed for dizziness. Will cause drowsiness. 30 tablet 0  . Omega-3 Fatty Acids (FISH OIL) 1000 MG CAPS Take 2 capsules by mouth daily.     . rosuvastatin (CRESTOR) 10 MG tablet Take 10 mg by mouth daily.  1  . traMADol (ULTRAM) 50 MG tablet Take 1 tablet (50 mg total) by mouth every 6 (six) hours as needed. 30 tablet 1  . vitamin  B-12 (CYANOCOBALAMIN) 1000 MCG tablet Take 1,000 mcg by mouth daily.    Marland Kitchen omeprazole (PRILOSEC) 20 MG capsule Take 1 capsule (20 mg total) by mouth daily. 30 capsule 3  . PARoxetine (PAXIL) 10 MG tablet Take 1 tablet (10 mg total) by mouth daily. (Patient not taking: Reported on 05/11/2017) 30 tablet 0   No current facility-administered medications for this visit.     Allergies:  Allergies  Allergen Reactions  . Amitriptyline Other (See Comments)    Hearing loss  . Asa [Aspirin] Nausea Only    Only 325 mg dose   . Ciprofloxacin Other (See Comments)    Hearing loss  . Codeine Other (See Comments)    Heart flutters  . Sudafed [Pseudoephedrine Hcl]   . Tylenol [Acetaminophen] Palpitations    Past Surgical History:  Procedure Laterality Date  . BACK SURGERY    . NASAL SINUS SURGERY    . NECK SURGERY      Social History    Socioeconomic History  . Marital status: Married    Spouse name: Not on file  . Number of children: Not on file  . Years of education: Not on file  . Highest education level: Not on file  Occupational History  . Not on file  Social Needs  . Financial resource strain: Not on file  . Food insecurity:    Worry: Not on file    Inability: Not on file  . Transportation needs:    Medical: Not on file    Non-medical: Not on file  Tobacco Use  . Smoking status: Never Smoker  . Smokeless tobacco: Never Used  Substance and Sexual Activity  . Alcohol use: No  . Drug use: No  . Sexual activity: Not on file  Lifestyle  . Physical activity:    Days per week: Not on file    Minutes per session: Not on file  . Stress: Not on file  Relationships  . Social connections:    Talks on phone: Not on file    Gets together: Not on file    Attends religious service: Not on file    Active member of club or organization: Not on file    Attends meetings of clubs or organizations: Not on file    Relationship status: Not on file  Other Topics Concern  . Not on file  Social History Narrative  . Not on file    Family History  Problem Relation Age of Onset  . Heart disease Father   . Heart disease Brother   . Lung cancer Sister        smoked     ROS Review of Systems See HPI Constitution: No fevers or chills No malaise No diaphoresis Skin: No rash or itching Eyes: no blurry vision, no double vision GU: no dysuria or hematuria Neuro: no dizziness or headaches all others reviewed and negative   Objective: Vitals:   05/18/17 1011  BP: 122/70  Pulse: 76  Resp: 17  Temp: 98.6 F (37 C)  TempSrc: Oral  SpO2: 97%  Weight: 108 lb 12.8 oz (49.4 kg)  Height: 5\' 4"  (1.626 m)    Physical Exam  Constitutional: She is oriented to person, place, and time. She appears well-developed and well-nourished.  HENT:  Head: Normocephalic and atraumatic.  Right Ear: External ear normal.   Left Ear: External ear normal.  Nose: Nose normal.  Mouth/Throat: Oropharynx is clear and moist. No oropharyngeal exudate.  No sinus tenderness  Eyes: Conjunctivae  and EOM are normal.  Neck: Normal range of motion. Neck supple.  Cardiovascular: Normal rate, regular rhythm and normal heart sounds.  No murmur heard. Pulmonary/Chest: Effort normal and breath sounds normal. No stridor. No respiratory distress. She has no wheezes.  Abdominal: Normal appearance and bowel sounds are normal. There is no hepatosplenomegaly. There is tenderness in the epigastric area. There is no rigidity, no rebound, no guarding, no CVA tenderness, no tenderness at McBurney's point and negative Murphy's sign. No hernia.  Neurological: She is alert and oriented to person, place, and time.  Skin: Skin is warm. Capillary refill takes less than 2 seconds.  Psychiatric: She has a normal mood and affect. Her behavior is normal. Judgment and thought content normal.    Assessment and Plan Stephanie Henson was seen today for sinusitis and medication refill.  Diagnoses and all orders for this visit:  Gastroesophageal reflux disease without esophagitis -   Pt to try omeprazole for six weeks Gave handout with dietary modifications  Throat clearing Likely due to GERD Discussed that she should try zyrtec since postnasal drip is also a possibility  Acute URI- stable, no signs of sinusitis,  Mostly like postnasal drip from rhinitis Advised zyrtec  Other orders -     omeprazole (PRILOSEC) 20 MG capsule; Take 1 capsule (20 mg total) by mouth daily.     Zoe A Stallings

## 2017-05-22 ENCOUNTER — Encounter: Payer: Self-pay | Admitting: Family Medicine

## 2017-05-22 ENCOUNTER — Ambulatory Visit: Payer: Self-pay

## 2017-05-22 ENCOUNTER — Ambulatory Visit (INDEPENDENT_AMBULATORY_CARE_PROVIDER_SITE_OTHER): Payer: Medicare Other | Admitting: Family Medicine

## 2017-05-22 VITALS — BP 140/80 | HR 71 | Ht 64.0 in | Wt 109.0 lb

## 2017-05-22 DIAGNOSIS — M1990 Unspecified osteoarthritis, unspecified site: Secondary | ICD-10-CM

## 2017-05-22 DIAGNOSIS — F329 Major depressive disorder, single episode, unspecified: Secondary | ICD-10-CM

## 2017-05-22 DIAGNOSIS — F419 Anxiety disorder, unspecified: Secondary | ICD-10-CM

## 2017-05-22 DIAGNOSIS — R7303 Prediabetes: Secondary | ICD-10-CM

## 2017-05-22 DIAGNOSIS — T887XXA Unspecified adverse effect of drug or medicament, initial encounter: Secondary | ICD-10-CM

## 2017-05-22 DIAGNOSIS — G8929 Other chronic pain: Secondary | ICD-10-CM | POA: Insufficient documentation

## 2017-05-22 DIAGNOSIS — M545 Low back pain, unspecified: Secondary | ICD-10-CM

## 2017-05-22 DIAGNOSIS — M542 Cervicalgia: Secondary | ICD-10-CM | POA: Diagnosis not present

## 2017-05-22 MED ORDER — SERTRALINE HCL 25 MG PO TABS: 25.0000 mg | ORAL_TABLET | Freq: Every day | ORAL | 0 refills | Status: DC

## 2017-05-22 MED ORDER — LORAZEPAM 0.5 MG PO TABS: 0.5000 mg | ORAL_TABLET | Freq: Two times a day (BID) | ORAL | 0 refills | Status: DC

## 2017-05-22 NOTE — Progress Notes (Signed)
Subjective:  Patient ID: Stephanie Henson, female    DOB: November 27, 1945  Age: 72 y.o. MRN: [id number]  CC: Follow-up   HPI JEANEE FABRE presents for discussion of her medicines.  She is accompanied by her husband.  She would like to discuss discontinuing the Ultram.  However it does seem to be helping her chronic arthritic pains, chronic neck and back pain.  She is status post surgery on her neck and back.  She has a history of GERD.  Vicodin in the past has led to a ringing in her years.  It may be the best choice for her pain at this time.  She knows that I am concerned about the lorazepam in combination with the Ultram.  She has been somewhat successful just taking the lorazepam in the morning.  But she does admit to being unmotivated and sad throughout the day.  She has trouble sleeping at night her neck pain.  She did not tolerate the Paxil.  She would like to explore alternatives to lorazepam for her anxiety.  Believe that she also suffers from depression and informed her that lorazepam alone would not help that issue.  Outpatient Medications Prior to Visit  Medication Sig Dispense Refill  . ALPHAGAN P 0.1 % SOLN INSTILL 1 DROP IN BOTH EYES THREE TIMES A DAY  99  . aspirin 81 MG tablet Take 81 mg by mouth daily.    Marland Kitchen azelastine (ASTELIN) 0.1 % nasal spray USE 2 SPRAYS INTO EACH NOSTRIL TWICE A DAY  0  . BETIMOL 0.5 % ophthalmic solution Place 1 drop into both eyes daily. Each eye once per day    . CAPSAICIN HOT PATCH EX Apply 1 patch topically daily as needed (pain).    . fluticasone (FLONASE) 50 MCG/ACT nasal spray Place 1 spray into both nostrils daily. 16 g 6  . latanoprost (XALATAN) 0.005 % ophthalmic solution Place 1 drop into both eyes at bedtime.     . meclizine (ANTIVERT) 25 MG tablet Take 1/2-1 pill every 6 or 8 hours as needed for dizziness. Will cause drowsiness. 30 tablet 0  . Omega-3 Fatty Acids (FISH OIL) 1000 MG CAPS Take 2 capsules by mouth daily.     Marland Kitchen omeprazole  (PRILOSEC) 20 MG capsule Take 1 capsule (20 mg total) by mouth daily. 30 capsule 3  . rosuvastatin (CRESTOR) 10 MG tablet Take 10 mg by mouth daily.  1  . traMADol (ULTRAM) 50 MG tablet Take 1 tablet (50 mg total) by mouth every 6 (six) hours as needed. 30 tablet 1  . vitamin B-12 (CYANOCOBALAMIN) 1000 MCG tablet Take 1,000 mcg by mouth daily.    Marland Kitchen LORazepam (ATIVAN) 0.5 MG tablet Take 1 tablet (0.5 mg total) by mouth 2 (two) times daily. 60 tablet 0  . PARoxetine (PAXIL) 10 MG tablet Take 1 tablet (10 mg total) by mouth daily. 30 tablet 0   No facility-administered medications prior to visit.     ROS Review of Systems  Constitutional: Negative.   Musculoskeletal: Positive for arthralgias, back pain, neck pain and neck stiffness.  Psychiatric/Behavioral: Positive for dysphoric mood and sleep disturbance. The patient is nervous/anxious.     Objective:  BP 140/80 (BP Location: Left Arm, Patient Position: Sitting, Cuff Size: Normal)   Pulse 71   Ht 5\' 4"  (1.626 m)   Wt 109 lb (49.4 kg)   SpO2 98%   BMI 18.71 kg/m   BP Readings from Last 3 Encounters:  05/22/17 140/80  05/18/17 122/70  05/17/17 132/74    Wt Readings from Last 3 Encounters:  05/22/17 109 lb (49.4 kg)  05/18/17 108 lb 12.8 oz (49.4 kg)  05/17/17 109 lb 8 oz (49.7 kg)    Physical Exam  Constitutional: She is oriented to person, place, and time. She appears well-developed and well-nourished. No distress.  HENT:  Head: Normocephalic and atraumatic.  Right Ear: External ear normal.  Left Ear: External ear normal.  Eyes: Right eye exhibits no discharge. Left eye exhibits no discharge. No scleral icterus.  Neck: No JVD present. No tracheal deviation present.  Pulmonary/Chest: Effort normal. No stridor.  Neurological: She is alert and oriented to person, place, and time.  Skin: Skin is warm and dry. She is not diaphoretic.  Psychiatric: She has a normal mood and affect. Her behavior is normal.    Lab Results    Component Value Date   WBC 6.2 04/11/2017   HGB 14.6 04/11/2017   HCT 44.7 04/11/2017   PLT 224.0 04/11/2017   GLUCOSE 113 (H) 04/11/2017   CHOL 170 09/13/2016   TRIG 127 09/13/2016   HDL 50 09/13/2016   LDLCALC 95 09/13/2016   ALT 15 04/11/2017   AST 20 04/11/2017   NA 140 04/11/2017   K 4.6 04/11/2017   CL 102 04/11/2017   CREATININE 0.94 04/11/2017   BUN 18 04/11/2017   CO2 32 04/11/2017   TSH 0.835 05/18/2016   INR 1.01 04/22/2016   HGBA1C 6.5 08/31/2016    Mr Cervical Spine W/o Contrast  Result Date: 04/05/2017 CLINICAL DATA:  Neck and bilateral shoulder pain for over 20 years. History of prior cervical surgery. EXAM: MRI CERVICAL SPINE WITHOUT CONTRAST TECHNIQUE: Multiplanar, multisequence MR imaging of the cervical spine was performed. No intravenous contrast was administered. COMPARISON:  Plain film cervical spine from [facility] 09/27/2016. FINDINGS: Alignment: Maintained. Vertebrae: No fracture or worrisome lesion. The patient is status post C5-7 ACDF. Cord: Normal signal throughout. Posterior Fossa, vertebral arteries, paraspinal tissues: Negative. Disc levels: C2-3: Minimal bulge without stenosis. Mild facet arthropathy bilaterally. C3-4: Mild-to-moderate facet degenerative disease is worse on the left. Shallow central protrusion is identified. No stenosis. C4-5: Mild-to-moderate facet degenerative disease is worse on the left. There is mild disc bulging and uncovertebral spurring. The central canal and foramina are open. C5-6: Status post discectomy and fusion. The central canal and foramina are widely patent. C6-7: Status post discectomy and fusion. The central canal and foramina are widely patent. C7-T1: There is some facet degenerative disease. This level is otherwise negative. IMPRESSION: Status post C5-7 ACDF without evidence of complication. The central canal and foramina are widely patent at both levels. Small central protrusion at C3-4 and left worse than  right facet degenerative disease without stenosis. Minimal disc bulge and uncovertebral disease C4-5 without central canal or foraminal stenosis. Mild to moderate facet degenerative disease is worse on the left at this level. Electronically Signed   By: Drusilla Kanner M.D.   On: 04/05/2017 14:59    Assessment & Plan:   Kamariya was seen today for follow-up.  Diagnoses and all orders for this visit:  Reactive depression -     sertraline (ZOLOFT) 25 MG tablet; Take 1 tablet (25 mg total) by mouth daily. After 10 days take 2 pills daily.  Pre-diabetes  Chronic midline low back pain without sciatica  Medication side effect  Arthritis  Neck pain, chronic  Anxiety -     LORazepam (ATIVAN) 0.5 MG tablet; Take 1 tablet (0.5  mg total) by mouth 2 (two) times daily. -     sertraline (ZOLOFT) 25 MG tablet; Take 1 tablet (25 mg total) by mouth daily. After 10 days take 2 pills daily.   I have discontinued Jericha C. Gierke's PARoxetine. I am also having her start on sertraline. Additionally, I am having her maintain her latanoprost, BETIMOL, aspirin, ALPHAGAN P, azelastine, Fish Oil, rosuvastatin, CAPSAICIN HOT PATCH EX, vitamin B-12, meclizine, fluticasone, traMADol, omeprazole, and LORazepam.  Meds ordered this encounter  Medications  . LORazepam (ATIVAN) 0.5 MG tablet    Sig: Take 1 tablet (0.5 mg total) by mouth 2 (two) times daily.    Dispense:  60 tablet    Refill:  0  . sertraline (ZOLOFT) 25 MG tablet    Sig: Take 1 tablet (25 mg total) by mouth daily. After 10 days take 2 pills daily.    Dispense:  60 tablet    Refill:  0   Spent 20 minutes with patient in her husband discussing her current medical therapy.  She agreed to try the Zoloft.  We will start with a very low dose and increase slowly.  She will go back to taking the lorazepam twice a day.  She will try an extra strength Tylenol in the morning and use the tramadol at night only.  Unfortunately cannot use the SNRIs with the  Ultram.  If she is successful with the Zoloft we may  be able to taper her lorazepam.  Follow-up in 1 month.  Follow-up: Return in about 1 month (around 06/21/2017).  Mliss SaxWilliam Alfred Antonella Upson, MD

## 2017-05-22 NOTE — Telephone Encounter (Signed)
Patient called and says "I want to come off the Tramadol. Dr. Doreene BurkeKremer saw me last Monday and mentioned about me coming off because I have ringing in my ears and have been crying. I wasn't ready to talk about it then, but I am now. I have been upset, crying, forgetful, headache, and shaking today. I need something for my arthritis pain, but with the ringing in my ears, I can't keep taking the Tramadol. I have taken it once/day and I took it last on Friday. I took a Lorazepam this morning for my nerves. What can I do?" I advised that Dr. Doreene BurkeKremer noted that when she was ready to return to see him, she agreed and was very apologetic for her behavior in the office, she's crying on the phone. I advised he will gladly accept her apology in the office and that he has availability today.  Appointment scheduled at 1330, care advice given, patient verbalized understanding.   Reason for Disposition . Caller has NON-URGENT medication question about med that PCP prescribed and triager unable to answer question  Answer Assessment - Initial Assessment Questions 1. SYMPTOMS: "Do you have any symptoms?"     Headache, forgetful, shaking and crying 2. SEVERITY: If symptoms are present, ask "Are they mild, moderate or severe?"     Moderate to severe-lorazepam helped me to calm down  Protocols used: MEDICATION QUESTION CALL-A-AH

## 2017-05-22 NOTE — Telephone Encounter (Signed)
Noted  

## 2017-05-22 NOTE — Patient Instructions (Signed)
Fall Prevention in the Home Falls can cause injuries. They can happen to people of all ages. There are many things you can do to make your home safe and to help prevent falls. What can I do on the outside of my home?  Regularly fix the edges of walkways and driveways and fix any cracks.  Remove anything that might make you trip as you walk through a door, such as a raised step or threshold.  Trim any bushes or trees on the path to your home.  Use bright outdoor lighting.  Clear any walking paths of anything that might make someone trip, such as rocks or tools.  Regularly check to see if handrails are loose or broken. Make sure that both sides of any steps have handrails.  Any raised decks and porches should have guardrails on the edges.  Have any leaves, snow, or ice cleared regularly.  Use sand or salt on walking paths during winter.  Clean up any spills in your garage right away. This includes oil or grease spills. What can I do in the bathroom?  Use night lights.  Install grab bars by the toilet and in the tub and shower. Do not use towel bars as grab bars.  Use non-skid mats or decals in the tub or shower.  If you need to sit down in the shower, use a plastic, non-slip stool.  Keep the floor dry. Clean up any water that spills on the floor as soon as it happens.  Remove soap buildup in the tub or shower regularly.  Attach bath mats securely with double-sided non-slip rug tape.  Do not have throw rugs and other things on the floor that can make you trip. What can I do in the bedroom?  Use night lights.  Make sure that you have a light by your bed that is easy to reach.  Do not use any sheets or blankets that are too big for your bed. They should not hang down onto the floor.  Have a firm chair that has side arms. You can use this for support while you get dressed.  Do not have throw rugs and other things on the floor that can make you trip. What can I do in the  kitchen?  Clean up any spills right away.  Avoid walking on wet floors.  Keep items that you use a lot in easy-to-reach places.  If you need to reach something above you, use a strong step stool that has a grab bar.  Keep electrical cords out of the way.  Do not use floor polish or wax that makes floors slippery. If you must use wax, use non-skid floor wax.  Do not have throw rugs and other things on the floor that can make you trip. What can I do with my stairs?  Do not leave any items on the stairs.  Make sure that there are handrails on both sides of the stairs and use them. Fix handrails that are broken or loose. Make sure that handrails are as long as the stairways.  Check any carpeting to make sure that it is firmly attached to the stairs. Fix any carpet that is loose or worn.  Avoid having throw rugs at the top or bottom of the stairs. If you do have throw rugs, attach them to the floor with carpet tape.  Make sure that you have a light switch at the top of the stairs and the bottom of the stairs. If you do   not have them, ask someone to add them for you. What else can I do to help prevent falls?  Wear shoes that: ? Do not have high heels. ? Have rubber bottoms. ? Are comfortable and fit you well. ? Are closed at the toe. Do not wear sandals.  If you use a stepladder: ? Make sure that it is fully opened. Do not climb a closed stepladder. ? Make sure that both sides of the stepladder are locked into place. ? Ask someone to hold it for you, if possible.  Clearly mark and make sure that you can see: ? Any grab bars or handrails. ? First and last steps. ? Where the edge of each step is.  Use tools that help you move around (mobility aids) if they are needed. These include: ? Canes. ? Walkers. ? Scooters. ? Crutches.  Turn on the lights when you go into a dark area. Replace any light bulbs as soon as they burn out.  Set up your furniture so you have a clear path.  Avoid moving your furniture around.  If any of your floors are uneven, fix them.  If there are any pets around you, be aware of where they are.  Review your medicines with your doctor. Some medicines can make you feel dizzy. This can increase your chance of falling. Ask your doctor what other things that you can do to help prevent falls. This information is not intended to replace advice given to you by your health care provider. Make sure you discuss any questions you have with your health care provider. Document Released: 12/04/2008 Document Revised: 07/16/2015 Document Reviewed: 03/14/2014 Elsevier Interactive Patient Education  2018 [place] After being diagnosed with an anxiety disorder, you may be relieved to know why you have felt or behaved a certain way. It is natural to also feel overwhelmed about the treatment ahead and what it will mean for your life. With care and support, you can manage this condition and recover from it. How to cope with anxiety Dealing with stress Stress is your body's reaction to life changes and events, both good and bad. Stress can last just a few hours or it can be ongoing. Stress can play a major role in anxiety, so it is important to learn both how to cope with stress and how to think about it differently. Talk with your health care provider or a counselor to learn more about stress reduction. He or she may suggest some stress reduction techniques, such as:  Music therapy. This can include creating or listening to music that you enjoy and that inspires you.  Mindfulness-based meditation. This involves being aware of your normal breaths, rather than trying to control your breathing. It can be done while sitting or walking.  Centering prayer. This is a kind of meditation that involves focusing on a word, phrase, or sacred image that is meaningful to you and that brings you peace.  Deep breathing. To do this, expand your stomach  and inhale slowly through your nose. Hold your breath for 3-5 seconds. Then exhale slowly, allowing your stomach muscles to relax.  Self-talk. This is a skill where you identify thought patterns that lead to anxiety reactions and correct those thoughts.  Muscle relaxation. This involves tensing muscles then relaxing them.  Choose a stress reduction technique that fits your lifestyle and personality. Stress reduction techniques take time and practice. Set aside 5-15 minutes a day to do them. Therapists can offer  training in these techniques. The training may be covered by some insurance plans. Other things you can do to manage stress include:  Keeping a stress diary. This can help you learn what triggers your stress and ways to control your response.  Thinking about how you respond to certain situations. You may not be able to control everything, but you can control your reaction.  Making time for activities that help you relax, and not feeling guilty about spending your time in this way.  Therapy combined with coping and stress-reduction skills provides the best chance for successful treatment. Medicines Medicines can help ease symptoms. Medicines for anxiety include:  Anti-anxiety drugs.  Antidepressants.  Beta-blockers.  Medicines may be used as the main treatment for anxiety disorder, along with therapy, or if other treatments are not working. Medicines should be prescribed by a health care provider. Relationships Relationships can play a big part in helping you recover. Try to spend more time connecting with trusted friends and family members. Consider going to couples counseling, taking family education classes, or going to family therapy. Therapy can help you and others better understand the condition. How to recognize changes in your condition Everyone has a different response to treatment for anxiety. Recovery from anxiety happens when symptoms decrease and stop interfering with  your daily activities at home or work. This may mean that you will start to:  Have better concentration and focus.  Sleep better.  Be less irritable.  Have more energy.  Have improved memory.  It is important to recognize when your condition is getting worse. Contact your health care provider if your symptoms interfere with home or work and you do not feel like your condition is improving. Where to find help and support: You can get help and support from these sources:  Self-help groups.  Online and [employer].  A trusted spiritual leader.  Couples counseling.  Family education classes.  Family therapy.  Follow these instructions at home:  Eat a healthy diet that includes plenty of vegetables, fruits, whole grains, low-fat dairy products, and lean protein. Do not eat a lot of foods that are high in solid fats, added sugars, or salt.  Exercise. Most adults should do the following: ? Exercise for at least 150 minutes each week. The exercise should increase your heart rate and make you sweat (moderate-intensity exercise). ? Strengthening exercises at least twice a week.  Cut down on caffeine, tobacco, alcohol, and other potentially harmful substances.  Get the right amount and quality of sleep. Most adults need 7-9 hours of sleep each night.  Make choices that simplify your life.  Take over-the-counter and prescription medicines only as told by your health care provider.  Avoid caffeine, alcohol, and certain over-the-counter cold medicines. These may make you feel worse. Ask your pharmacist which medicines to avoid.  Keep all follow-up visits as told by your health care provider. This is important. Questions to ask your health care provider  Would I benefit from therapy?  How often should I follow up with a health care provider?  How long do I need to take medicine?  Are there any long-term side effects of my medicine?  Are there any alternatives to  taking medicine? Contact a health care provider if:  You have a hard time staying focused or finishing daily tasks.  You spend many hours a day feeling worried about everyday life.  You become exhausted by worry.  You start to have headaches, feel tense, or have nausea.  You urinate more than normal.  You have diarrhea. Get help right away if:  You have a racing heart and shortness of breath.  You have thoughts of hurting yourself or others. If you ever feel like you may hurt yourself or others, or have thoughts about taking your own life, get help right away. You can go to your nearest emergency department or call:  Your local emergency services (911 in the [place]  A suicide crisis helpline, such as the National Suicide Prevention Lifeline at 518-635-4863. This is open 24-hours a day.  Summary  Taking steps to deal with stress can help calm you.  Medicines cannot cure anxiety disorders, but they can help ease symptoms.  Family, friends, and partners can play a big part in helping you recover from an anxiety disorder. This information is not intended to replace advice given to you by your health care provider. Make sure you discuss any questions you have with your health care provider. Document Released: 02/02/2016 Document Revised: 02/02/2016 Document Reviewed: 02/02/2016 Elsevier Interactive Patient Education  2018 [employer].  Major Depressive Disorder, Adult Major depressive disorder (MDD) is a mental health condition. MDD often makes you feel sad, hopeless, or helpless. MDD can also cause symptoms in your body. MDD can affect your:  Work.  School.  Relationships.  Other normal activities.  MDD can range from mild to very bad. It may occur once (single episode MDD). It can also occur many times (recurrent MDD). The main symptoms of MDD often include:  Feeling sad, depressed, or irritable most of the time.  Loss of interest.  MDD symptoms also  include:  Sleeping too much or too little.  Eating too much or too little.  A change in your weight.  Feeling tired (fatigue) or having low energy.  Feeling worthless.  Feeling guilty.  Trouble making decisions.  Trouble thinking clearly.  Thoughts of suicide or harming others.  Feeling weak.  Feeling agitated.  Keeping yourself from being around other people (isolation).  Follow these instructions at home: Activity  Do these things as told by your doctor: ? Go back to your normal activities. ? Exercise regularly. ? Spend time outdoors. Alcohol  Talk with your doctor about how alcohol can affect your antidepressant medicines.  Do not drink alcohol. Or, limit how much alcohol you drink. ? This means no more than 1 drink a day for nonpregnant women and 2 drinks a day for men. One drink equals one of these:  12 oz of beer.  5 oz of wine.  1 oz of hard liquor. General instructions  Take over-the-counter and prescription medicines only as told by your doctor.  Eat a healthy diet.  Get plenty of sleep.  Find activities that you enjoy. Make time to do them.  Think about joining a support group. Your doctor may be able to suggest a group for you.  Keep all follow-up visits as told by your doctor. This is important. Where to find more information:  [employer] on Mental Illness: ? www.nami.org  [place] [employer] of Mental Health: ? http://www.maynard.net/  National Suicide Prevention Lifeline: ? 608-382-6219. This is free, 24-hour help. Contact a doctor if:  Your symptoms get worse.  You have new symptoms. Get help right away if:  You self-harm.  You see, hear, taste, smell, or feel things that are not present (hallucinate). If you ever feel like you may hurt yourself or others, or have thoughts about taking your own life, get help right  away. You can go to your nearest emergency department or call:  Your local emergency services (911 in  the [place]  A suicide crisis helpline, such as the National Suicide Prevention Lifeline: ? 725 118 20581-605-660-8576. This is open 24 hours a day.  This information is not intended to replace advice given to you by your health care provider. Make sure you discuss any questions you have with your health care provider. Document Released: 01/19/2015 Document Revised: 10/25/2015 Document Reviewed: 10/25/2015 Elsevier Interactive Patient Education  2017 [employer]

## 2017-05-26 ENCOUNTER — Other Ambulatory Visit: Payer: Self-pay

## 2017-05-26 ENCOUNTER — Encounter (HOSPITAL_COMMUNITY): Payer: Self-pay

## 2017-05-26 ENCOUNTER — Emergency Department (HOSPITAL_COMMUNITY): Payer: Medicare Other

## 2017-05-26 ENCOUNTER — Emergency Department (HOSPITAL_COMMUNITY)
Admission: EM | Admit: 2017-05-26 | Discharge: 2017-05-26 | Disposition: A | Discharge status: Home / Self Care | Payer: Medicare Other | Attending: Emergency Medicine | Admitting: Emergency Medicine

## 2017-05-26 ENCOUNTER — Ambulatory Visit: Payer: Self-pay | Admitting: *Deleted

## 2017-05-26 DIAGNOSIS — I129 Hypertensive chronic kidney disease with stage 1 through stage 4 chronic kidney disease, or unspecified chronic kidney disease: Secondary | ICD-10-CM | POA: Insufficient documentation

## 2017-05-26 DIAGNOSIS — Y929 Unspecified place or not applicable: Secondary | ICD-10-CM | POA: Insufficient documentation

## 2017-05-26 DIAGNOSIS — Y999 Unspecified external cause status: Secondary | ICD-10-CM | POA: Insufficient documentation

## 2017-05-26 DIAGNOSIS — Z79899 Other long term (current) drug therapy: Secondary | ICD-10-CM | POA: Insufficient documentation

## 2017-05-26 DIAGNOSIS — E1122 Type 2 diabetes mellitus with diabetic chronic kidney disease: Secondary | ICD-10-CM | POA: Insufficient documentation

## 2017-05-26 DIAGNOSIS — Z7982 Long term (current) use of aspirin: Secondary | ICD-10-CM | POA: Insufficient documentation

## 2017-05-26 DIAGNOSIS — I10 Essential (primary) hypertension: Secondary | ICD-10-CM

## 2017-05-26 DIAGNOSIS — S0990XA Unspecified injury of head, initial encounter: Secondary | ICD-10-CM | POA: Diagnosis present

## 2017-05-26 DIAGNOSIS — M542 Cervicalgia: Secondary | ICD-10-CM

## 2017-05-26 DIAGNOSIS — Y9389 Activity, other specified: Secondary | ICD-10-CM | POA: Diagnosis not present

## 2017-05-26 DIAGNOSIS — W2209XA Striking against other stationary object, initial encounter: Secondary | ICD-10-CM | POA: Diagnosis not present

## 2017-05-26 DIAGNOSIS — M47812 Spondylosis without myelopathy or radiculopathy, cervical region: Secondary | ICD-10-CM | POA: Diagnosis not present

## 2017-05-26 DIAGNOSIS — N183 Chronic kidney disease, stage 3 (moderate): Secondary | ICD-10-CM | POA: Insufficient documentation

## 2017-05-26 MED ORDER — HYDROMORPHONE HCL 1 MG/ML IJ SOLN
1.0000 mg | Freq: Once | INTRAMUSCULAR | Status: AC
  Administered 2017-05-26: 1 mg via INTRAMUSCULAR
  Filled 2017-05-26: qty 1

## 2017-05-26 MED ORDER — HYDROCODONE-ACETAMINOPHEN 5-325 MG PO TABS: 1.0000 | ORAL_TABLET | ORAL | 0 refills | Status: DC | PRN

## 2017-05-26 MED ORDER — ONDANSETRON 4 MG PO TBDP
4.0000 mg | ORAL_TABLET | Freq: Once | ORAL | Status: AC | PRN
  Administered 2017-05-26: 4 mg via ORAL
  Filled 2017-05-26: qty 1

## 2017-05-26 NOTE — ED Triage Notes (Signed)
Pt presents today after a hitting her head on the shower rail. She denies blood thinner. No LOC. C/O dizziness,head pain and eye pain since event.

## 2017-05-26 NOTE — ED Provider Notes (Signed)
[facility]-EMERGENCY DEPT Provider Note   CSN: [id number] Arrival date & time: 05/26/17  1147     History   Chief Complaint Chief Complaint  Patient presents with  . Fall    HPI Stephanie Henson is a 72 y.o. female.  He presents for evaluation of injury to head, she was bending over, stood up and hit her head on a chair rail.  She did not lose consciousness.  Since that time she is felt dizzy, had blurred vision, and a sensation of pain of her face.  She denies blurred vision, chest pain, back pain or extremity pain.  Her husband states that she was able to walk but was "staggering."  She complains of ongoing chronic neck pain, not different than usual.  There are no other known modifying factors.  HPI  Past Medical History:  Diagnosis Date  . Anxiety   . Cervicalgia   . CKD (chronic kidney disease) stage 3, GFR 30-59 ml/min (HCC)   . Coronary artery calcification seen on CAT scan 05/18/2016   Myoview 7/18: EF 67, no ischemia; Low Risk   . Diabetes mellitus (HCC)   . Environmental allergies   . FH: colonic polyps   . Fibromyalgia   . GERD (gastroesophageal reflux disease)   . Goiter   . Hematuria   . Hyperlipidemia   . Hypertension   . Osteoporosis   . Solitary pulmonary nodule     ;RUL - 7mm on CT 04/2014; CT 10/2014 resolution of nodule on RUL however persistant smaller nodules throughouts the lungs  . Weight loss     Patient Active Problem List   Diagnosis Date Noted  . Neck pain, chronic 05/22/2017  . Medication side effect 05/17/2017  . 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  . Chronic midline low back pain without sciatica 04/11/2017  . Generalized abdominal pain 04/11/2017  . Acute non-recurrent maxillary sinusitis 04/04/2017  . Arthritis 03/30/2017  . Anxiety 03/30/2017  . Pre-diabetes 08/31/2016  . Systolic hypertension 08/31/2016  . Coronary artery calcification seen on CAT scan 05/18/2016  . Essential hypertension 05/18/2016  . Type 2  diabetes mellitus without complication (HCC) 05/18/2016  . HLD (hyperlipidemia) 05/18/2016  . Plantar fasciitis 03/25/2016  . Anemia 10/12/2015  . Glaucoma 09/16/2015  . Pulmonary nodules 12/26/2012  . Chronic rhinitis 12/26/2012    Past Surgical History:  Procedure Laterality Date  . BACK SURGERY    . NASAL SINUS SURGERY    . NECK SURGERY       OB History   None      Home Medications    Prior to Admission medications   Medication Sig Start Date End Date Taking? Authorizing Provider  ALPHAGAN P 0.1 % SOLN INSTILL 1 DROP IN BOTH EYES THREE TIMES A DAY 12/24/14  Yes [provider]  aspirin 81 MG tablet Take 81 mg by mouth daily.   Yes [provider]  azelastine (ASTELIN) 0.1 % nasal spray USE 2 SPRAYS INTO EACH NOSTRIL TWICE A DAY 05/20/15  Yes [provider]  BETIMOL 0.5 % ophthalmic solution Place 1 drop into both eyes daily. Each eye once per day 11/06/12  Yes [provider]  CAPSAICIN HOT PATCH EX Apply 1 patch topically daily as needed (pain).   Yes [provider]  fluticasone (FLONASE) 50 MCG/ACT nasal spray Place 1 spray into both nostrils daily. 04/04/17  Yes Mliss Sax, MD  latanoprost (XALATAN) 0.005 % ophthalmic solution Place 1 drop into both eyes at bedtime.  12/22/12  Yes [provider]  LORazepam (ATIVAN) 0.5 MG tablet Take 1 tablet (0.5 mg total) by mouth 2 (two) times daily. 05/22/17  Yes Mliss Sax, MD  LOTEMAX 0.5 % GEL Place 1 drop into both eyes 4 (four) times daily. 05/18/17  Yes [provider]  Omega-3 Fatty Acids (FISH OIL) 1000 MG CAPS Take 2 capsules by mouth daily.    Yes [provider]  omeprazole (PRILOSEC) 20 MG capsule Take 1 capsule (20 mg total) by mouth daily. 05/18/17  Yes Stallings, Zoe A, MD  rosuvastatin (CRESTOR) 10 MG tablet Take 10 mg by mouth daily. 02/04/16  Yes [provider]  sertraline (ZOLOFT) 25 MG tablet Take 1 tablet (25 mg  total) by mouth daily. After 10 days take 2 pills daily. 05/22/17  Yes Mliss Sax, MD  vitamin B-12 (CYANOCOBALAMIN) 1000 MCG tablet Take 1,000 mcg by mouth daily.   Yes [provider]  HYDROcodone-acetaminophen (NORCO) 5-325 MG tablet Take 1 tablet by mouth every 4 (four) hours as needed. 05/26/17   Mancel Bale, MD  meclizine (ANTIVERT) 25 MG tablet Take 1/2-1 pill every 6 or 8 hours as needed for dizziness. Will cause drowsiness. Patient not taking: Reported on 05/26/2017 08/31/16   Peyton Najjar, MD    Family History Family History  Problem Relation Age of Onset  . Heart disease Father   . Heart disease Brother   . Lung cancer Sister        smoked    Social History Social History   Tobacco Use  . Smoking status: Never Smoker  . Smokeless tobacco: Never Used  Substance Use Topics  . Alcohol use: No  . Drug use: No     Allergies   Amitriptyline; Asa [aspirin]; Ciprofloxacin; Codeine; Sudafed [pseudoephedrine hcl]; and Tylenol [acetaminophen]   Review of Systems Review of Systems  All other systems reviewed and are negative.    Physical Exam Updated Vital Signs BP (!) 196/87 (BP Location: Left Arm)   Pulse 66   Temp 98.3 F (36.8 C) (Oral)   Resp 16   Ht 5\' 4"  (1.626 m)   Wt 49.4 kg (109 lb)   SpO2 96%   BMI 18.71 kg/m   Physical Exam  Constitutional: She is oriented to person, place, and time. She appears well-developed. No distress.  Elderly, frail  HENT:  Head: Normocephalic and atraumatic.  Eyes: Pupils are equal, round, and reactive to light. Conjunctivae and EOM are normal.  Neck: Normal range of motion and phonation normal. Neck supple.  Cardiovascular: Normal rate and regular rhythm.  Pulmonary/Chest: Effort normal and breath sounds normal. She exhibits no tenderness.  Abdominal: Soft. She exhibits no distension. There is no tenderness. There is no guarding.  Musculoskeletal: Normal range of motion. She exhibits no tenderness or  deformity.  Tender neck posteriorly without step-off.  Decreased neck flexion secondary to pain.  Neurological: She is alert and oriented to person, place, and time. She exhibits normal muscle tone.  No dysarthria, aphasia or nystagmus.  Skin: Skin is warm and dry.  Psychiatric: She has a normal mood and affect. Her behavior is normal. Judgment and thought content normal.  Nursing note and vitals reviewed.    ED Treatments / Results  Labs (all labs ordered are listed, but only abnormal results are displayed) Labs Reviewed - No data to display  EKG None  Radiology Ct Head Wo Contrast  Result Date: 05/26/2017 CLINICAL DATA:  Fall.  Head trauma EXAM:  CT HEAD WITHOUT CONTRAST CT CERVICAL SPINE WITHOUT CONTRAST TECHNIQUE: Multidetector CT imaging of the head and cervical spine was performed following the standard protocol without intravenous contrast. Multiplanar CT image reconstructions of the cervical spine were also generated. COMPARISON:  CT head 04/22/2016 FINDINGS: CT HEAD FINDINGS Brain: No evidence of acute infarction, hemorrhage, hydrocephalus, extra-axial collection or mass lesion/mass effect. Vascular: Negative for hyperdense vessel Skull: Negative Sinuses/Orbits: Prior sinus surgery with ethmoidectomy. Sinuses are clear. Negative orbit. Other: None CT CERVICAL SPINE FINDINGS Alignment: Normal Skull base and vertebrae: Negative for fracture. ACDF C5 through C7 with solid interbody fusion at both levels. Soft tissues and spinal canal: Negative Disc levels: Mild disc and facet degeneration C3-4 and C4-5. Solid fusion C5-6 and C6-7 Upper chest: Negative Other: None IMPRESSION: 1. No acute intracranial abnormality 2. Solid fusion C5-6 and C6-7. Negative for cervical spine fracture. Electronically Signed   By: Marlan Palau M.D.   On: 05/26/2017 13:46   Ct Cervical Spine Wo Contrast  Result Date: 05/26/2017 CLINICAL DATA:  Fall.  Head trauma EXAM: CT HEAD WITHOUT CONTRAST CT CERVICAL SPINE  WITHOUT CONTRAST TECHNIQUE: Multidetector CT imaging of the head and cervical spine was performed following the standard protocol without intravenous contrast. Multiplanar CT image reconstructions of the cervical spine were also generated. COMPARISON:  CT head 04/22/2016 FINDINGS: CT HEAD FINDINGS Brain: No evidence of acute infarction, hemorrhage, hydrocephalus, extra-axial collection or mass lesion/mass effect. Vascular: Negative for hyperdense vessel Skull: Negative Sinuses/Orbits: Prior sinus surgery with ethmoidectomy. Sinuses are clear. Negative orbit. Other: None CT CERVICAL SPINE FINDINGS Alignment: Normal Skull base and vertebrae: Negative for fracture. ACDF C5 through C7 with solid interbody fusion at both levels. Soft tissues and spinal canal: Negative Disc levels: Mild disc and facet degeneration C3-4 and C4-5. Solid fusion C5-6 and C6-7 Upper chest: Negative Other: None IMPRESSION: 1. No acute intracranial abnormality 2. Solid fusion C5-6 and C6-7. Negative for cervical spine fracture. Electronically Signed   By: Marlan Palau M.D.   On: 05/26/2017 13:46    Procedures Procedures (including critical care time)  Medications Ordered in ED Medications  ondansetron (ZOFRAN-ODT) disintegrating tablet 4 mg (4 mg Oral Given 05/26/17 1230)  HYDROmorphone (DILAUDID) injection 1 mg (1 mg Intramuscular Given 05/26/17 1337)     Initial Impression / Assessment and Plan / ED Course  I have reviewed the triage vital signs and the nursing notes.  Pertinent labs & imaging results that were available during my care of the patient were reviewed by me and considered in my medical decision making (see chart for details).      Patient Vitals for the past 24 hrs:  BP Temp Temp src Pulse Resp SpO2 Height Weight  05/26/17 1346 (!) 196/87 98.3 F (36.8 C) Oral 66 16 96 % - -  05/26/17 1332 (!) 194/91 - - 62 - 96 % - -  05/26/17 1300 (!) 191/86 - - 71 - 97 % - -  05/26/17 1213 (!) 210/91 98.4 F (36.9 C)  Oral 70 20 94 % 5\' 4"  (1.626 m) 49.4 kg (109 lb)    2:11 PM Reevaluation with update and discussion. After initial assessment and treatment, an updated evaluation reveals she is more comfortable at this time and has no further complaints.  Findings discussed with patient and husband, all questions answered. Mancel Bale   MDM-patient with head injury, minor, with headache and malaise, and neck pain.  She has chronic cervical spinal arthritis.  No sign of significant head injury, or  concussive symptoms.  Incidental hypertension without signs of hypertensive urgency.  Doubt cervical fracture, spinal myelopathy or serious head injury.  Nursing Notes Reviewed/ Care Coordinated Applicable Imaging Reviewed Interpretation of Laboratory Data incorporated into ED treatment  The patient appears reasonably screened and/or stabilized for discharge and I doubt any other medical condition or other [facility] requiring further screening, evaluation, or treatment in the ED at this time prior to discharge.  Plan: Home Medications-continue current medications, OTC analgesia of choice; Home Treatments-rest, fluids; return here if the recommended treatment, does not improve the symptoms; Recommended follow up-PCP follow-up 1-2 weeks for recheck blood pressure and as needed.    Final Clinical Impressions(s) / ED Diagnoses   Final diagnoses:  Injury of head, initial encounter  Neck pain  Spondylosis of cervical region without myelopathy or radiculopathy  Hypertension, unspecified type    ED Discharge Orders        Ordered    HYDROcodone-acetaminophen (NORCO) 5-325 MG tablet  Every 4 hours PRN     05/26/17 1414       Mancel BaleWentz, Damaso Laday, MD 05/28/17 1824

## 2017-05-26 NOTE — Telephone Encounter (Signed)
Spoke  With  Patients  Husband   -  Pt  Bent  Over   This  Am   And hit  Her  Head  Pts  Husband   States  She  Is  In a  Lot  Of  Pain -  When  I  attempted  To  Do   A  Triage  assessment  On  The  Patient  -  He  Stated  In a loud and  forcefull  Voice . I  Do not have  Time  To  Answer   Any  Questions . I just need  To know  If I  Should  Take  Her to the  Hospital.With the  Limited  Amount of  Information I  Was able  To obtain from the  Husband  And  The unwillingness  Of him  To talk  With me ; I advised  Him  That  She  Should  Be  [facility] as  Soon as  Possible

## 2017-05-26 NOTE — Telephone Encounter (Signed)
Pt is at ED

## 2017-05-26 NOTE — Discharge Instructions (Addendum)
There were no serious injuries found on the testing today.  Use ice on the sore area 3 or 4 times a day to help the discomfort.  We are prescribing hydrocodone with acetaminophen, which you can hopefully tolerate.  If you develop severe nausea, trouble breathing or heart palpitations he should stop taking this medication.  Return here, or see your doctor as needed for problems.

## 2017-05-28 ENCOUNTER — Emergency Department (HOSPITAL_COMMUNITY)
Admission: EM | Admit: 2017-05-28 | Discharge: 2017-05-28 | Disposition: A | Discharge status: Home / Self Care | Payer: Medicare Other | Attending: Emergency Medicine | Admitting: Emergency Medicine

## 2017-05-28 ENCOUNTER — Emergency Department (HOSPITAL_COMMUNITY): Payer: Medicare Other

## 2017-05-28 ENCOUNTER — Encounter (HOSPITAL_COMMUNITY): Payer: Self-pay

## 2017-05-28 DIAGNOSIS — E1122 Type 2 diabetes mellitus with diabetic chronic kidney disease: Secondary | ICD-10-CM | POA: Insufficient documentation

## 2017-05-28 DIAGNOSIS — I129 Hypertensive chronic kidney disease with stage 1 through stage 4 chronic kidney disease, or unspecified chronic kidney disease: Secondary | ICD-10-CM | POA: Diagnosis not present

## 2017-05-28 DIAGNOSIS — Z7982 Long term (current) use of aspirin: Secondary | ICD-10-CM | POA: Insufficient documentation

## 2017-05-28 DIAGNOSIS — N183 Chronic kidney disease, stage 3 (moderate): Secondary | ICD-10-CM | POA: Insufficient documentation

## 2017-05-28 DIAGNOSIS — Z79899 Other long term (current) drug therapy: Secondary | ICD-10-CM | POA: Insufficient documentation

## 2017-05-28 DIAGNOSIS — R531 Weakness: Secondary | ICD-10-CM | POA: Diagnosis present

## 2017-05-28 LAB — URINALYSIS, ROUTINE W REFLEX MICROSCOPIC
Bacteria, UA: NONE SEEN
Bilirubin Urine: NEGATIVE
GLUCOSE, UA: NEGATIVE mg/dL
Ketones, ur: NEGATIVE mg/dL
LEUKOCYTES UA: NEGATIVE
NITRITE: NEGATIVE
PH: 7 (ref 5.0–8.0)
Protein, ur: NEGATIVE mg/dL
SPECIFIC GRAVITY, URINE: 1.004 — AB (ref 1.005–1.030)
SQUAMOUS EPITHELIAL / LPF: NONE SEEN

## 2017-05-28 LAB — COMPREHENSIVE METABOLIC PANEL
ALBUMIN: 3.9 g/dL (ref 3.5–5.0)
ALK PHOS: 77 U/L (ref 38–126)
ALT: 19 U/L (ref 14–54)
AST: 27 U/L (ref 15–41)
Anion gap: 9 (ref 5–15)
BILIRUBIN TOTAL: 0.7 mg/dL (ref 0.3–1.2)
BUN: 13 mg/dL (ref 6–20)
CALCIUM: 9.5 mg/dL (ref 8.9–10.3)
CO2: 26 mmol/L (ref 22–32)
CREATININE: 0.82 mg/dL (ref 0.44–1.00)
Chloride: 107 mmol/L (ref 101–111)
GFR calc Af Amer: >60 mL/min (ref >60)
GLUCOSE: 165 mg/dL — AB (ref 65–99)
Potassium: 3.5 mmol/L (ref 3.5–5.1)
Sodium: 142 mmol/L (ref 135–145)
Total Protein: 6.7 g/dL (ref 6.5–8.1)

## 2017-05-28 LAB — CBC WITH DIFFERENTIAL/PLATELET
BASOS PCT: 0 %
Basophils Absolute: 0 10*3/uL (ref 0.0–0.1)
EOS ABS: 0.1 10*3/uL (ref 0.0–0.7)
EOS PCT: 2 %
HCT: 40.6 % (ref 36.0–46.0)
Hemoglobin: 12.9 g/dL (ref 12.0–15.0)
Lymphocytes Relative: 19 %
Lymphs Abs: 1.2 10*3/uL (ref 0.7–4.0)
MCH: 28.7 pg (ref 26.0–34.0)
MCHC: 31.8 g/dL (ref 30.0–36.0)
MCV: 90.2 fL (ref 78.0–100.0)
MONO ABS: 0.6 10*3/uL (ref 0.1–1.0)
Monocytes Relative: 9 %
Neutro Abs: 4.4 10*3/uL (ref 1.7–7.7)
Neutrophils Relative %: 70 %
PLATELETS: 176 10*3/uL (ref 150–400)
RBC: 4.5 MIL/uL (ref 3.87–5.11)
RDW: 12.8 % (ref 11.5–15.5)
WBC: 6.3 10*3/uL (ref 4.0–10.5)

## 2017-05-28 MED ORDER — LORAZEPAM 1 MG PO TABS
1.0000 mg | ORAL_TABLET | Freq: Once | ORAL | Status: AC
  Administered 2017-05-28: 1 mg via ORAL
  Filled 2017-05-28: qty 1

## 2017-05-28 MED ORDER — TRAMADOL HCL 50 MG PO TABS: 50.0000 mg | ORAL_TABLET | Freq: Once | ORAL | Status: DC

## 2017-05-28 NOTE — ED Notes (Signed)
Husband at nurses station...advised that the doctor did sign up for patient and will be there as soon as she can, this RN apologized for the wait.

## 2017-05-28 NOTE — ED Notes (Signed)
Pt states "I am trying to stop taking tramadol at home and don't want it, I really just want to go home" Wheelchair provided.

## 2017-05-28 NOTE — ED Notes (Signed)
Patient transported to CT 

## 2017-05-28 NOTE — ED Notes (Signed)
Pt states she is leaving, husband at bedside.  Pt tired of waiting for tests and wants to go home.  Pt aware MRI's do take a long time.  Offered pain medication which was refused.  Pt and husband state understanding that they are leaving against medical advise and are aware of the risks.  Advised pt and husband to come back for any new or concerning symptoms.

## 2017-05-28 NOTE — ED Triage Notes (Signed)
Pt arrived via [facility] from home c/o headache and unsteady gait.  Pt reports hit head Friday on a chair rail and [facility] states she hasn't improved.  Denies blood thinners, fall, LOC.

## 2017-05-28 NOTE — ED Provider Notes (Signed)
[facility] EMERGENCY DEPARTMENT Provider Note   CSN: [id number] Arrival date & time: 05/28/17  1241     History   Chief Complaint Chief Complaint  Patient presents with  . Head Injury  . Gait Problem    HPI Stephanie Henson is a 72 y.o. female.  The history is provided by the patient. No language interpreter was used.  Head Injury      Stephanie Henson is a 72 y.o. female who presents to the Emergency Department complaining of weakness. She presents to the emergency department accompanied by family for evaluation of weakness. Two days ago she bent forward to pick up an item and struck the top of her head. She experienced immediate head pain and neck pain. She was evaluated at that time and had imaging of her head and neck performed and was discharged home. Since that time she reports persistent head and neck pain. Today she noted that she had weakness of her arms and legs and had difficulty with walking. She denies any fevers, nausea, vomiting, dysuria. She does state that a week ago her doctor began tapering off her lorazepam and tramadol and she is displeased with this plan. Her husband is concerned because her blood pressure is been elevated today and a few days ago. She has no history of hypertension. She reports feeling very anxious as well.  Past Medical History:  Diagnosis Date  . Anxiety   . Cervicalgia   . CKD (chronic kidney disease) stage 3, GFR 30-59 ml/min (HCC)   . Coronary artery calcification seen on CAT scan 05/18/2016   Myoview 7/18: EF 67, no ischemia; Low Risk   . Diabetes mellitus (HCC)   . Environmental allergies   . FH: colonic polyps   . Fibromyalgia   . GERD (gastroesophageal reflux disease)   . Goiter   . Hematuria   . Hyperlipidemia   . Hypertension   . Osteoporosis   . Solitary pulmonary nodule     ;RUL - 7mm on CT 04/2014; CT 10/2014 resolution of nodule on RUL however persistant smaller nodules throughouts the lungs  . Weight  loss     Patient Active Problem List   Diagnosis Date Noted  . Neck pain, chronic 05/22/2017  . Medication side effect 05/17/2017  . 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  . Chronic midline low back pain without sciatica 04/11/2017  . Generalized abdominal pain 04/11/2017  . Acute non-recurrent maxillary sinusitis 04/04/2017  . Arthritis 03/30/2017  . Anxiety 03/30/2017  . Pre-diabetes 08/31/2016  . Systolic hypertension 08/31/2016  . Coronary artery calcification seen on CAT scan 05/18/2016  . Essential hypertension 05/18/2016  . Type 2 diabetes mellitus without complication (HCC) 05/18/2016  . HLD (hyperlipidemia) 05/18/2016  . Plantar fasciitis 03/25/2016  . Anemia 10/12/2015  . Glaucoma 09/16/2015  . Pulmonary nodules 12/26/2012  . Chronic rhinitis 12/26/2012    Past Surgical History:  Procedure Laterality Date  . BACK SURGERY    . NASAL SINUS SURGERY    . NECK SURGERY       OB History   None      Home Medications    Prior to Admission medications   Medication Sig Start Date End Date Taking? Authorizing Provider  ALPHAGAN P 0.1 % SOLN INSTILL 1 DROP IN BOTH EYES THREE TIMES A DAY 12/24/14   [provider]  aspirin 81 MG tablet Take 81 mg by mouth daily.    [provider]  azelastine (ASTELIN) 0.1 % nasal spray  USE 2 SPRAYS INTO EACH NOSTRIL TWICE A DAY 05/20/15   [provider]  BETIMOL 0.5 % ophthalmic solution Place 1 drop into both eyes daily. Each eye once per day 11/06/12   [provider]  CAPSAICIN HOT PATCH EX Apply 1 patch topically daily as needed (pain).    [provider]  fluticasone (FLONASE) 50 MCG/ACT nasal spray Place 1 spray into both nostrils daily. 04/04/17   Mliss SaxKremer, William Alfred, MD  HYDROcodone-acetaminophen (NORCO) 5-325 MG tablet Take 1 tablet by mouth every 4 (four) hours as needed. 05/26/17   Mancel BaleWentz, Elliott, MD  latanoprost (XALATAN) 0.005 % ophthalmic solution Place 1 drop into both eyes at bedtime.   12/22/12   [provider]  LORazepam (ATIVAN) 0.5 MG tablet Take 1 tablet (0.5 mg total) by mouth 2 (two) times daily. 05/22/17   Mliss SaxKremer, William Alfred, MD  LOTEMAX 0.5 % GEL Place 1 drop into both eyes 4 (four) times daily. 05/18/17   [provider]  meclizine (ANTIVERT) 25 MG tablet Take 1/2-1 pill every 6 or 8 hours as needed for dizziness. Will cause drowsiness. Patient not taking: Reported on 05/26/2017 08/31/16   Peyton NajjarHopper, David H, MD  Omega-3 Fatty Acids (FISH OIL) 1000 MG CAPS Take 2 capsules by mouth daily.     [provider]  omeprazole (PRILOSEC) 20 MG capsule Take 1 capsule (20 mg total) by mouth daily. 05/18/17   Doristine BosworthStallings, Zoe A, MD  rosuvastatin (CRESTOR) 10 MG tablet Take 10 mg by mouth daily. 02/04/16   [provider]  sertraline (ZOLOFT) 25 MG tablet Take 1 tablet (25 mg total) by mouth daily. After 10 days take 2 pills daily. 05/22/17   Mliss SaxKremer, William Alfred, MD  vitamin B-12 (CYANOCOBALAMIN) 1000 MCG tablet Take 1,000 mcg by mouth daily.    [provider]    Family History Family History  Problem Relation Age of Onset  . Heart disease Father   . Heart disease Brother   . Lung cancer Sister        smoked    Social History Social History   Tobacco Use  . Smoking status: Never Smoker  . Smokeless tobacco: Never Used  Substance Use Topics  . Alcohol use: No  . Drug use: No     Allergies   Amitriptyline; Asa [aspirin]; Ciprofloxacin; Codeine; Sudafed [pseudoephedrine hcl]; and Tylenol [acetaminophen]   Review of Systems Review of Systems  All other systems reviewed and are negative.    Physical Exam Updated Vital Signs BP (!) 180/95   Pulse 66   Resp 15   Ht 5\' 4"  (1.626 m)   Wt 49.4 kg (109 lb)   SpO2 97%   BMI 18.71 kg/m   Physical Exam  Constitutional: She is oriented to person, place, and time. She appears well-developed and well-nourished.  HENT:  Head: Normocephalic and atraumatic.  Cardiovascular:  Normal rate and regular rhythm.  No murmur heard. Pulmonary/Chest: Effort normal and breath sounds normal. No respiratory distress.  Abdominal: Soft. There is no tenderness. There is no rebound and no guarding.  Musculoskeletal: She exhibits no edema or tenderness.  Diffuse C-spine tenderness to palpation  Neurological: She is alert and oriented to person, place, and time.  4/5 strength in all four extremities. Proximal and distal muscle groups are equally week. 2+ patellar reflexes bilaterally. Sensational light touch intact in all four extremities.  Skin: Skin is warm and dry.  Psychiatric:  Anxious appearing  Nursing note and vitals reviewed.  ED Treatments / Results  Labs (all labs ordered are listed, but only abnormal results are displayed) Labs Reviewed  URINALYSIS, ROUTINE W REFLEX MICROSCOPIC - Abnormal; Notable for the following components:      Result Value   Color, Urine STRAW (*)    Specific Gravity, Urine 1.004 (*)    Hgb urine dipstick SMALL (*)    All other components within normal limits  COMPREHENSIVE METABOLIC PANEL - Abnormal; Notable for the following components:   Glucose, Bld 165 (*)    All other components within normal limits  CBC WITH DIFFERENTIAL/PLATELET    EKG None  Radiology Ct Head Wo Contrast  Result Date: 05/28/2017 CLINICAL DATA:  Posttraumatic headache. EXAM: CT HEAD WITHOUT CONTRAST TECHNIQUE: Contiguous axial images were obtained from the base of the skull through the vertex without intravenous contrast. COMPARISON:  05/26/2017 FINDINGS: Brain: No evidence of acute infarction, hemorrhage, hydrocephalus, extra-axial collection or mass lesion/mass effect. Vascular: No hyperdense vessel or unexpected calcification. Skull: Normal. Negative for fracture or focal lesion. Sinuses/Orbits: Normal globes and orbits. Visualized sinuses and mastoid air cells are clear. Other: None. IMPRESSION: Normal unenhanced CT scan of the brain. Electronically Signed    By: Amie Portland M.D.   On: 05/28/2017 16:41    Procedures Procedures (including critical care time)  Medications Ordered in ED Medications  LORazepam (ATIVAN) tablet 1 mg (1 mg Oral Given 05/28/17 1642)     Initial Impression / Assessment and Plan / ED Course  I have reviewed the triage vital signs and the nursing notes.  Pertinent labs & imaging results that were available during my care of the patient were reviewed by me and considered in my medical decision making (see chart for details).     Patient here for evaluation of weakness following a fall two days ago. She does have generalized weakness on examination. Initial CT two days ago was negative for acute abnormality. Repeat CT had obtained to look for evidence of delayed bleed. Recommend MRI to evaluate for central cord syndrome. Patient elope from the department prior to being able to obtain imaging. Final Clinical Impressions(s) / ED Diagnoses   Final diagnoses:  None    ED Discharge Orders    None       Tilden Fossa, MD 05/28/17 2226

## 2017-05-28 NOTE — ED Notes (Signed)
Pt placed on bedpan

## 2017-05-28 NOTE — ED Notes (Signed)
Pt called out states she is leaving if she doesn't get seen.  Apologized to pt about the wait advised there has been a few emergencies to manage but MD will be seeing her as soon as possible.

## 2017-05-28 NOTE — ED Notes (Signed)
Pts husband at nurses station states wife would like medication for pain. Dr. Madilyn Hookees notified.

## 2017-05-28 NOTE — ED Notes (Signed)
Pt unable to urinate with purewick, removed and placed on bedpan

## 2017-05-28 NOTE — ED Notes (Signed)
Assured pt MD will be with them as soon as they can.  Family and pastor at bedside

## 2017-05-28 NOTE — ED Notes (Signed)
Pt given a Mal[place]key sandwich and water, per Dr. Madilyn Hookees.

## 2017-05-28 NOTE — ED Notes (Addendum)
Placed a purewick external catheter with Cricket, Charity fundraiserN.  Pt endorses understanding.

## 2017-05-29 ENCOUNTER — Telehealth: Payer: Self-pay | Admitting: Family Medicine

## 2017-05-29 NOTE — Telephone Encounter (Signed)
Call patient and inquire what are questions are.

## 2017-05-29 NOTE — Telephone Encounter (Unsigned)
Copied from CRM #[id number]. Topic: Quick Communication - [phone] Telephone Encounter >> May 29, 2017  1:18 PM Raquel SarnaHayes, Stephanie G wrote: [facility]  Needing to discuss the lab reports and CT scan that were done on her.

## 2017-05-30 ENCOUNTER — Other Ambulatory Visit: Payer: Self-pay | Admitting: Family Medicine

## 2017-05-30 DIAGNOSIS — F419 Anxiety disorder, unspecified: Secondary | ICD-10-CM

## 2017-05-30 NOTE — Telephone Encounter (Signed)
Rx sent in 4/1, will call pharmacy at 9am when they open to see if they received Rx.

## 2017-06-02 ENCOUNTER — Ambulatory Visit (INDEPENDENT_AMBULATORY_CARE_PROVIDER_SITE_OTHER): Payer: Medicare Other | Admitting: Family Medicine

## 2017-06-02 ENCOUNTER — Encounter: Payer: Self-pay | Admitting: Family Medicine

## 2017-06-02 ENCOUNTER — Ambulatory Visit: Payer: Self-pay

## 2017-06-02 ENCOUNTER — Telehealth: Payer: Self-pay | Admitting: Family Medicine

## 2017-06-02 VITALS — BP 138/84 | HR 68 | Ht 64.0 in | Wt 109.0 lb

## 2017-06-02 DIAGNOSIS — F419 Anxiety disorder, unspecified: Secondary | ICD-10-CM

## 2017-06-02 DIAGNOSIS — G8929 Other chronic pain: Secondary | ICD-10-CM

## 2017-06-02 DIAGNOSIS — R42 Dizziness and giddiness: Secondary | ICD-10-CM | POA: Diagnosis not present

## 2017-06-02 DIAGNOSIS — M542 Cervicalgia: Secondary | ICD-10-CM | POA: Diagnosis not present

## 2017-06-02 DIAGNOSIS — Z862 Personal history of diseases of the blood and blood-forming organs and certain disorders involving the immune mechanism: Secondary | ICD-10-CM

## 2017-06-02 DIAGNOSIS — I10 Essential (primary) hypertension: Secondary | ICD-10-CM

## 2017-06-02 DIAGNOSIS — M545 Low back pain: Secondary | ICD-10-CM | POA: Diagnosis not present

## 2017-06-02 DIAGNOSIS — F329 Major depressive disorder, single episode, unspecified: Secondary | ICD-10-CM

## 2017-06-02 DIAGNOSIS — T887XXA Unspecified adverse effect of drug or medicament, initial encounter: Secondary | ICD-10-CM | POA: Diagnosis not present

## 2017-06-02 LAB — CBC
HCT: 41.9 % (ref 35.0–45.0)
Hemoglobin: 14.3 g/dL (ref 11.7–15.5)
MCH: 29.8 pg (ref 27.0–33.0)
MCHC: 34.1 g/dL (ref 32.0–36.0)
MCV: 87.3 fL (ref 80.0–100.0)
MPV: 12.2 fL (ref 7.5–12.5)
PLATELETS: 203 10*3/uL (ref 140–400)
RBC: 4.8 10*6/uL (ref 3.80–5.10)
RDW: 12.2 % (ref 11.0–15.0)
WBC: 5.6 10*3/uL (ref 3.8–10.8)

## 2017-06-02 MED ORDER — GABAPENTIN 100 MG PO CAPS: ORAL_CAPSULE | ORAL | 3 refills | Status: DC

## 2017-06-02 MED ORDER — PREGABALIN 75 MG PO CAPS: ORAL_CAPSULE | ORAL | 0 refills | Status: DC

## 2017-06-02 NOTE — Telephone Encounter (Signed)
Noted  

## 2017-06-02 NOTE — Addendum Note (Signed)
Addended by: Andrez GrimeKREMER, Maribella Kuna A on: 06/02/2017 04:32 PM   Modules accepted: Orders

## 2017-06-02 NOTE — Patient Instructions (Signed)
Gabapentin capsules or tablets What is this medicine? GABAPENTIN (GA ba pen tin) is used to control partial seizures in adults with epilepsy. It is also used to treat certain types of nerve pain. This medicine may be used for other purposes; ask your health care provider or pharmacist if you have questions. COMMON BRAND NAME(S): Active-PAC with Gabapentin, Gabarone, Neurontin What should I tell my health care provider before I take this medicine? They need to know if you have any of these conditions: -kidney disease -suicidal thoughts, plans, or attempt; a previous suicide attempt by you or a family member -an unusual or allergic reaction to gabapentin, other medicines, foods, dyes, or preservatives -pregnant or trying to get pregnant -breast-feeding How should I use this medicine? Take this medicine by mouth with a glass of water. Follow the directions on the prescription label. You can take it with or without food. If it upsets your stomach, take it with food.Take your medicine at regular intervals. Do not take it more often than directed. Do not stop taking except on your doctor's advice. If you are directed to break the 600 or 800 mg tablets in half as part of your dose, the extra half tablet should be used for the next dose. If you have not used the extra half tablet within 28 days, it should be thrown away. A special MedGuide will be given to you by the pharmacist with each prescription and refill. Be sure to read this information carefully each time. Talk to your pediatrician regarding the use of this medicine in children. Special care may be needed. Overdosage: If you think you have taken too much of this medicine contact a poison control center or emergency room at once. NOTE: This medicine is only for you. Do not share this medicine with others. What if I miss a dose? If you miss a dose, take it as soon as you can. If it is almost time for your next dose, take only that dose. Do not  take double or extra doses. What may interact with this medicine? Do not take this medicine with any of the following medications: -other gabapentin products This medicine may also interact with the following medications: -alcohol -antacids -antihistamines for allergy, cough and cold -certain medicines for anxiety or sleep -certain medicines for depression or psychotic disturbances -homatropine; hydrocodone -naproxen -narcotic medicines (opiates) for pain -phenothiazines like chlorpromazine, mesoridazine, prochlorperazine, thioridazine This list may not describe all possible interactions. Give your health care provider a list of all the medicines, herbs, non-prescription drugs, or dietary supplements you use. Also tell them if you smoke, drink alcohol, or use illegal drugs. Some items may interact with your medicine. What should I watch for while using this medicine? Visit your doctor or health care professional for regular checks on your progress. You may want to keep a record at home of how you feel your condition is responding to treatment. You may want to share this information with your doctor or health care professional at each visit. You should contact your doctor or health care professional if your seizures get worse or if you have any new types of seizures. Do not stop taking this medicine or any of your seizure medicines unless instructed by your doctor or health care professional. Stopping your medicine suddenly can increase your seizures or their severity. Wear a medical identification bracelet or chain if you are taking this medicine for seizures, and carry a card that lists all your medications. You may get drowsy, dizzy,   or have blurred vision. Do not drive, use machinery, or do anything that needs mental alertness until you know how this medicine affects you. To reduce dizzy or fainting spells, do not sit or stand up quickly, especially if you are an older patient. Alcohol can  increase drowsiness and dizziness. Avoid alcoholic drinks. Your mouth may get dry. Chewing sugarless gum or sucking hard candy, and drinking plenty of water will help. The use of this medicine may increase the chance of suicidal thoughts or actions. Pay special attention to how you are responding while on this medicine. Any worsening of mood, or thoughts of suicide or dying should be reported to your health care professional right away. Women who become pregnant while using this medicine may enroll in the [place] Antiepileptic Drug Pregnancy Registry by calling 1-888-233-2334. This registry collects information about the safety of antiepileptic drug use during pregnancy. What side effects may I notice from receiving this medicine? Side effects that you should report to your doctor or health care professional as soon as possible: -allergic reactions like skin rash, itching or hives, swelling of the face, lips, or tongue -worsening of mood, thoughts or actions of suicide or dying Side effects that usually do not require medical attention (report to your doctor or health care professional if they continue or are bothersome): -constipation -difficulty walking or controlling muscle movements -dizziness -nausea -slurred speech -tiredness -tremors -weight gain This list may not describe all possible side effects. Call your doctor for medical advice about side effects. You may report side effects to FDA at 1-800-FDA-1088. Where should I keep my medicine? Keep out of reach of children. This medicine may cause accidental overdose and death if it taken by other adults, children, or pets. Mix any unused medicine with a substance like cat litter or coffee grounds. Then throw the medicine away in a sealed container like a sealed bag or a coffee can with a lid. Do not use the medicine after the expiration date. Store at room temperature between 15 and 30 degrees C (59 and 86 degrees F). NOTE: This  sheet is a summary. It may not cover all possible information. If you have questions about this medicine, talk to your doctor, pharmacist, or health care provider.  2018 Elsevier/Gold Standard (2013-04-05 15:26:50)  

## 2017-06-02 NOTE — Telephone Encounter (Signed)
Patient's husband Alinda MoneyMelvin, called in with the patient c/o "dizziness, elevated BP."  He says "she has been dizzy since she was seen in the emergency room on 05/26/17, when she hit her head. I saw on the monitor her BP was elevated and they told me they not worried about that right now. Since then, she has been still dizzy, walking around holding on to the wall and has a headache. I took her BP today and it was 199/78. She's weak, tired all the time and this has been going on too long. I believe she needs something for her BP." I asked is the room spinning, she says "no." According to protocol, see PCP within 24 hours, appointment today at 1530 with Dr. Doreene BurkeKremer, care advice given, husband verified understanding.  Reason for Disposition . [1] MODERATE dizziness (e.g., interferes with normal activities) AND [2] has NOT been evaluated by physician for this  (Exception: dizziness caused by heat exposure, sudden standing, or poor fluid intake)  Answer Assessment - Initial Assessment Questions 1. DESCRIPTION: "Describe your dizziness."     Dizzy 2. LIGHTHEADED: "Do you feel lightheaded?" (e.g., somewhat faint, woozy, weak upon standing)     Lightheaded 3. VERTIGO: "Do you feel like either you or the room is spinning or tilting?" (i.e. vertigo)     No 4. SEVERITY: "How bad is it?"  "Do you feel like you are going to faint?" "Can you stand and walk?"   - MILD - walking normally   - MODERATE - interferes with normal activities (e.g., work, school)    - SEVERE - unable to stand, requires support to walk, feels like passing out now.      Severe 5. ONSET:  "When did the dizziness begin?"     About 2 weeks ago 6. AGGRAVATING FACTORS: "Does anything make it worse?" (e.g., standing, change in head position)     Standing up 7. HEART RATE: "Can you tell me your heart rate?" "How many beats in 15 seconds?"  (Note: not all patients can do this)       No 8. CAUSE: "What do you think is causing the dizziness?"     I  don't know, maybe BP is up 9. RECURRENT SYMPTOM: "Have you had dizziness before?" If so, ask: "When was the last time?" "What happened that time?"     No 10. OTHER SYMPTOMS: "Do you have any other symptoms?" (e.g., fever, chest pain, vomiting, diarrhea, bleeding)       Headache, weakness 11. PREGNANCY: "Is there any chance you are pregnant?" "When was your last menstrual period?"       No  Protocols used: DIZZINESS [facility]- LIGHTHEADEDNESS-A-AH

## 2017-06-02 NOTE — Progress Notes (Addendum)
Subjective:  Patient ID: Stephanie Henson, female    DOB: 1945/02/22  Age: 72 y.o. MRN: [id number]  CC: Hypertension   HPI AMIYRAH LAMERE presents for follow-up of her blood pressure.  Blood pressure has been high in the emergency room.  Fortunately today it is come back down to her normal.  She does not seem to be tolerating the low-dose Zoloft.  She has ongoing lightheadedness with it with an unsteady gait.  She denies frank vertigo.  Her husband mentions that her hemoglobin has been low in the past with the symptoms and she wound up requiring iron transfusions.  She has had no abdominal pain melena or blood in her stool.  She feels as though the Ultram has been causing ringing in her ears as vicodin has done in the past.  Outpatient Medications Prior to Visit  Medication Sig Dispense Refill  . ALPHAGAN P 0.1 % SOLN INSTILL 1 DROP IN BOTH EYES THREE TIMES A DAY  99  . aspirin 81 MG tablet Take 81 mg by mouth daily.    Marland Kitchen azelastine (ASTELIN) 0.1 % nasal spray USE 2 SPRAYS INTO EACH NOSTRIL TWICE A DAY  0  . BETIMOL 0.5 % ophthalmic solution Place 1 drop into both eyes daily. Each eye once per day    . CAPSAICIN HOT PATCH EX Apply 1 patch topically daily as needed (pain).    . fluticasone (FLONASE) 50 MCG/ACT nasal spray Place 1 spray into both nostrils daily. 16 g 6  . HYDROcodone-acetaminophen (NORCO) 5-325 MG tablet Take 1 tablet by mouth every 4 (four) hours as needed. 15 tablet 0  . latanoprost (XALATAN) 0.005 % ophthalmic solution Place 1 drop into both eyes at bedtime.     Marland Kitchen LORazepam (ATIVAN) 0.5 MG tablet TAKE 1 TABLET BY MOUTH TWICE A DAY 60 tablet 0  . LOTEMAX 0.5 % GEL Place 1 drop into both eyes 4 (four) times daily.  2  . Omega-3 Fatty Acids (FISH OIL) 1000 MG CAPS Take 2 capsules by mouth daily.     Marland Kitchen omeprazole (PRILOSEC) 20 MG capsule Take 1 capsule (20 mg total) by mouth daily. 30 capsule 3  . rosuvastatin (CRESTOR) 10 MG tablet Take 10 mg by mouth daily.  1  . vitamin  B-12 (CYANOCOBALAMIN) 1000 MCG tablet Take 1,000 mcg by mouth daily.    . sertraline (ZOLOFT) 25 MG tablet Take 1 tablet (25 mg total) by mouth daily. After 10 days take 2 pills daily. 60 tablet 0  . meclizine (ANTIVERT) 25 MG tablet Take 1/2-1 pill every 6 or 8 hours as needed for dizziness. Will cause drowsiness. (Patient not taking: Reported on 05/26/2017) 30 tablet 0   No facility-administered medications prior to visit.     ROS Review of Systems  Constitutional: Positive for fatigue. Negative for chills, fever and unexpected weight change.  HENT: Negative.   Eyes: Negative.   Respiratory: Negative.   Cardiovascular: Negative.   Gastrointestinal: Negative.   Musculoskeletal: Positive for back pain and neck pain.  Skin: Negative.   Neurological: Positive for weakness and light-headedness. Negative for dizziness and numbness.  Hematological: Does not bruise/bleed easily.  Psychiatric/Behavioral: Positive for dysphoric mood. The patient is nervous/anxious.     Objective:  BP 138/84 (BP Location: Left Arm, Patient Position: Sitting, Cuff Size: Normal)   Pulse 68   Ht 5\' 4"  (1.626 m)   Wt 109 lb (49.4 kg)   SpO2 98%   BMI 18.71 kg/m   BP  Readings from Last 3 Encounters:  06/02/17 138/84  05/28/17 (!) 180/95  05/26/17 (!) 168/90    Wt Readings from Last 3 Encounters:  06/02/17 109 lb (49.4 kg)  05/28/17 109 lb (49.4 kg)  05/26/17 109 lb (49.4 kg)    Physical Exam  Constitutional: She is oriented to person, place, and time. She appears well-developed and well-nourished. No distress.  HENT:  Head: Normocephalic and atraumatic.  Right Ear: External ear normal.  Left Ear: External ear normal.  Nose: Nose normal.  Mouth/Throat: Oropharynx is clear and moist.  Eyes: Pupils are equal, round, and reactive to light. Conjunctivae and EOM are normal. Right eye exhibits no discharge. Left eye exhibits no discharge. No scleral icterus.  Neck: Neck supple. No JVD present. No  tracheal deviation present. No thyromegaly present.  Cardiovascular: Normal rate, regular rhythm and normal heart sounds.  Pulmonary/Chest: Effort normal and breath sounds normal.  Lymphadenopathy:    She has no cervical adenopathy.  Neurological: She is alert and oriented to person, place, and time.  Skin: Skin is warm and dry. She is not diaphoretic.  Psychiatric: She has a normal mood and affect. Her behavior is normal.    Lab Results  Component Value Date   WBC 6.3 05/28/2017   HGB 12.9 05/28/2017   HCT 40.6 05/28/2017   PLT 176 05/28/2017   GLUCOSE 165 (H) 05/28/2017   CHOL 170 09/13/2016   TRIG 127 09/13/2016   HDL 50 09/13/2016   LDLCALC 95 09/13/2016   ALT 19 05/28/2017   AST 27 05/28/2017   NA 142 05/28/2017   K 3.5 05/28/2017   CL 107 05/28/2017   CREATININE 0.82 05/28/2017   BUN 13 05/28/2017   CO2 26 05/28/2017   TSH 0.835 05/18/2016   INR 1.01 04/22/2016   HGBA1C 6.5 08/31/2016    Ct Head Wo Contrast  Result Date: 05/28/2017 CLINICAL DATA:  Posttraumatic headache. EXAM: CT HEAD WITHOUT CONTRAST TECHNIQUE: Contiguous axial images were obtained from the base of the skull through the vertex without intravenous contrast. COMPARISON:  05/26/2017 FINDINGS: Brain: No evidence of acute infarction, hemorrhage, hydrocephalus, extra-axial collection or mass lesion/mass effect. Vascular: No hyperdense vessel or unexpected calcification. Skull: Normal. Negative for fracture or focal lesion. Sinuses/Orbits: Normal globes and orbits. Visualized sinuses and mastoid air cells are clear. Other: None. IMPRESSION: Normal unenhanced CT scan of the brain. Electronically Signed   By: Amie Portland M.D.   On: 05/28/2017 16:41    Assessment & Plan:   Regan was seen today for hypertension.  Diagnoses and all orders for this visit:  Essential hypertension -     CBC  Anxiety  Neck pain, chronic -     Discontinue: gabapentin (NEURONTIN) 100 MG capsule; One at night for 7 days and  then one twice each day. -     pregabalin (LYRICA) 75 MG capsule; Take one at night for a week and then one twice each day.  Chronic midline low back pain without sciatica -     Discontinue: gabapentin (NEURONTIN) 100 MG capsule; One at night for 7 days and then one twice each day. -     pregabalin (LYRICA) 75 MG capsule; Take one at night for a week and then one twice each day.  Reactive depression  Medication side effect -     Cancel: CBC -     CBC  Light headedness -     Cancel: CBC -     CBC  History of iron deficiency  anemia   I have discontinued Asianna C. Shrode's meclizine, sertraline, and gabapentin. I am also having her start on pregabalin. Additionally, I am having her maintain her latanoprost, BETIMOL, aspirin, ALPHAGAN P, azelastine, Fish Oil, rosuvastatin, CAPSAICIN HOT PATCH EX, vitamin B-12, fluticasone, omeprazole, LOTEMAX, HYDROcodone-acetaminophen, and LORazepam.  Meds ordered this encounter  Medications  . DISCONTD: gabapentin (NEURONTIN) 100 MG capsule    Sig: One at night for 7 days and then one twice each day.    Dispense:  90 capsule    Refill:  3  . pregabalin (LYRICA) 75 MG capsule    Sig: Take one at night for a week and then one twice each day.    Dispense:  60 capsule    Refill:  0   She is going to try to use her Ultram and lorazepam mostly in the nighttime.  We will try low-dose Neurontin.  Mentioned Cymbalta as an alternative if she is able to wean away from the Ultram.  Follow-up in 2 weeks.    Follow-up: Return in about 2 weeks (around 06/16/2017).  Mliss SaxWilliam Alfred Kremer, MD

## 2017-06-02 NOTE — Telephone Encounter (Signed)
Copied from CRM [phone]#[id number]. Topic: Quick Communication - See Telephone Encounter >> Jun 02, 2017  4:06 PM Louie BunPalacios Medina, Rosey Batheresa D wrote: CRM for notification. See Telephone encounter for: 06/02/17. Patient called and said that the medication gabapentin sent to her she can not take. Is there something else she can take. Pt does not remember what side affect it gave her but she wishes not to take it.

## 2017-06-05 ENCOUNTER — Ambulatory Visit (INDEPENDENT_AMBULATORY_CARE_PROVIDER_SITE_OTHER): Payer: Medicare Other

## 2017-06-05 ENCOUNTER — Ambulatory Visit (INDEPENDENT_AMBULATORY_CARE_PROVIDER_SITE_OTHER): Payer: Medicare Other | Admitting: Physical Medicine and Rehabilitation

## 2017-06-05 ENCOUNTER — Encounter (INDEPENDENT_AMBULATORY_CARE_PROVIDER_SITE_OTHER): Payer: Self-pay | Admitting: Physical Medicine and Rehabilitation

## 2017-06-05 VITALS — BP 178/96 | HR 56 | Temp 97.9°F

## 2017-06-05 DIAGNOSIS — M542 Cervicalgia: Secondary | ICD-10-CM | POA: Diagnosis not present

## 2017-06-05 DIAGNOSIS — M47812 Spondylosis without myelopathy or radiculopathy, cervical region: Secondary | ICD-10-CM

## 2017-06-05 MED ORDER — METHYLPREDNISOLONE ACETATE 80 MG/ML IJ SUSP
80.0000 mg | Freq: Once | INTRAMUSCULAR | Status: AC
  Administered 2017-06-05: 80 mg

## 2017-06-05 NOTE — Patient Instructions (Signed)

## 2017-06-05 NOTE — Procedures (Signed)
Diagnostic Cervical Facet Joint Nerve Block with Fluoroscopic Guidance  Patient: Stephanie BaileyClara C Galan      Date of Birth: [birth year] MRN: [id number] PCP: Mliss SaxKremer, William Alfred, MD      Visit Date: 06/05/2017   Universal Protocol:    Date/Time: 04/15/191:08 PM  Consent Given By: the patient  Position: LATERAL  Additional Comments: Vital signs were monitored before and after the procedure. Patient was prepped and draped in the usual sterile fashion. The correct patient, procedure, and site was verified.   Injection Procedure Details:  Procedure Site One Meds Administered:  Meds ordered this encounter  Medications  . methylPREDNISolone acetate (DEPO-MEDROL) injection 80 mg     Laterality: Left  Location/Site:  C3-4 C4-5  Needle size: 25 G  Needle type: spinal needle  Needle Placement: Articular Pillar  Findings:  -Contrast Used: 0.5 mL iohexol 180 mg iodine/mL   -Comments: Excellent flow of contrast of the articular pillar  Procedure Details: The fluoroscope beam was manipulated to achieve the best "true" lateral view possible by squaring off the endplates with cranial and caudal tilt and using varying obliquity to achieve the a view with the longest length of spinous process.  The region overlying the facet joints mentioned above were then localized under fluoroscopic visualization.  The needle was inserted down to the center of the "trapezoid" outline of the facet joint lateral mass. Bi-planar images were used for confirming placement and spot radiographs were documented.  A 0.25 ml volume of Omnipaque-240 was injected to look for vascular uptake. A 0.5 ml. volume of the anesthetic solution was injected onto the target. This procedure was repeated for each medial branch nerve injected.  Prior to the procedure, the patient was given a Pain Diary which was completed for baseline measurements.  After the procedure, the patient rated their pain every 30 minutes and will  continue rating at this frequency for a total of 5 hours.  The patient has been asked to complete the Diary and return to us by mail, fax or hand delivered as soon as possible.   Additional Comments:  The patient tolerated the procedure well Dressing: Band-Aid    Post-procedure details: Patient was observed during the procedure. Post-procedure instructions were reviewed.  Patient left the clinic in stable condition.

## 2017-06-05 NOTE — Progress Notes (Signed)
Stephanie Henson - 72 y.o. female MRN [id number]  Date of birth: 1945/06/29  Office Visit Note: Visit Date: 06/05/2017 PCP: Mliss Sax, MD Referred by: Mliss Sax,*  Subjective: Chief Complaint  Patient presents with  . Neck - Pain  . Right Shoulder - Pain  . Left Shoulder - Pain   HPI: Stephanie Henson is a 72 year old right-hand-dominant female who comes in today at the request of Dr. Otelia Sergeant for diagnostic and hopefully therapeutic left C3-4 and C4-5 facet joint blocks above her prior cervical fusion.  Unfortunately since the last time she saw Dr. Otelia Sergeant for evaluation she has had an incident where she ended up in the emergency room because she hit her head on a chair rail when she was rising up from a bent position.  She actually had 2 emergency room visits but she did have x-ray and CT scanning performed which did not show any acute injury.  She still suffering from symptoms including where she feels like his vision changes and headache and almost a postconcussive type syndrome.  Fortunately she has follow-up with her primary care physician since that visit and they have switched some medications to try to help.  They did take her off gabapentin and replaced that with Lyrica.  She continues on a small amount of hydrocodone.  Her primary care physician also did take also the other medications that could give her some balance issues.  She is actually having both sided neck pain today but more left and the left is been chronic.  Imaging suggests arthritis above the fusion level.  We are going to go ahead today and complete the diagnostic blocks and I think she can identify that left-sided pain enough to know if this is diagnostic.We went over this at length.  She has not had any radicular complaints down the arms or focal weakness.   ROS Otherwise per HPI.  Assessment & Plan: Visit Diagnoses:  1. Cervical spondylosis without myelopathy   2. Cervicalgia     Plan: No  additional findings.   Meds & Orders:  Meds ordered this encounter  Medications  . methylPREDNISolone acetate (DEPO-MEDROL) injection 80 mg    Orders Placed This Encounter  Procedures  . Facet Injection  . XR C-ARM NO REPORT    Follow-up: Return in about 2 weeks (around 06/19/2017) for Dr. Otelia Sergeant.   Procedures: No procedures performed  Diagnostic Cervical Facet Joint Nerve Block with Fluoroscopic Guidance  Patient: Stephanie Henson      Date of Birth: [birth year] MRN: [id number] PCP: Mliss Sax, MD      Visit Date: 06/05/2017   Universal Protocol:    Date/Time: 04/15/791:08 PM  Consent Given By: the patient  Position: LATERAL  Additional Comments: Vital signs were monitored before and after the procedure. Patient was prepped and draped in the usual sterile fashion. The correct patient, procedure, and site was verified.   Injection Procedure Details:  Procedure Site One Meds Administered:  Meds ordered this encounter  Medications  . methylPREDNISolone acetate (DEPO-MEDROL) injection 80 mg     Laterality: Left  Location/Site:  C3-4 C4-5  Needle size: 25 G  Needle type: spinal needle  Needle Placement: Articular Pillar  Findings:  -Contrast Used: 0.5 mL iohexol 180 mg iodine/mL   -Comments: Excellent flow of contrast of the articular pillar  Procedure Details: The fluoroscope beam was manipulated to achieve the best "true" lateral view possible by squaring off the endplates with cranial and caudal tilt and  using varying obliquity to achieve the a view with the longest length of spinous process.  The region overlying the facet joints mentioned above were then localized under fluoroscopic visualization.  The needle was inserted down to the center of the "trapezoid" outline of the facet joint lateral mass. Bi-planar images were used for confirming placement and spot radiographs were documented.  A 0.25 ml volume of Omnipaque-240 was injected to look for  vascular uptake. A 0.5 ml. volume of the anesthetic solution was injected onto the target. This procedure was repeated for each medial branch nerve injected.  Prior to the procedure, the patient was given a Pain Diary which was completed for baseline measurements.  After the procedure, the patient rated their pain every 30 minutes and will continue rating at this frequency for a total of 5 hours.  The patient has been asked to complete the Diary and return to us by mail, fax or hand delivered as soon as possible.   Additional Comments:  The patient tolerated the procedure well Dressing: Band-Aid    Post-procedure details: Patient was observed during the procedure. Post-procedure instructions were reviewed.  Patient left the clinic in stable condition.     Clinical History: CT CERVICAL SPINE FINDINGS  Alignment: Normal  Skull base and vertebrae: Negative for fracture. ACDF C5 through C7 with solid interbody fusion at both levels.  Soft tissues and spinal canal: Negative  Disc levels: Mild disc and facet degeneration C3-4 and C4-5. Solid fusion C5-6 and C6-7  Upper chest: Negative  Other: None  IMPRESSION: 1. No acute intracranial abnormality 2. Solid fusion C5-6 and C6-7. Negative for cervical spine fracture.   Electronically Signed By: Marlan Palauharles Clark M.D. On: 05/26/2017 13:46  MRI CERVICAL SPINE WITHOUT CONTRAST  TECHNIQUE: Multiplanar, multisequence MR imaging of the cervical spine was performed. No intravenous contrast was administered.  COMPARISON:  Plain film cervical spine from [facility] 09/27/2016.  FINDINGS: Alignment: Maintained.  Vertebrae: No fracture or worrisome lesion. The patient is status post C5-7 ACDF.  Cord: Normal signal throughout.  Posterior Fossa, vertebral arteries, paraspinal tissues: Negative.  Disc levels:  C2-3: Minimal bulge without stenosis. Mild facet arthropathy bilaterally.  C3-4:  Mild-to-moderate facet degenerative disease is worse on the left. Shallow central protrusion is identified. No stenosis.  C4-5: Mild-to-moderate facet degenerative disease is worse on the left. There is mild disc bulging and uncovertebral spurring. The central canal and foramina are open.  C5-6: Status post discectomy and fusion. The central canal and foramina are widely patent.  C6-7: Status post discectomy and fusion. The central canal and foramina are widely patent.  C7-T1: There is some facet degenerative disease. This level is otherwise negative.  IMPRESSION: Status post C5-7 ACDF without evidence of complication. The central canal and foramina are widely patent at both levels.  Small central protrusion at C3-4 and left worse than right facet degenerative disease without stenosis.  Minimal disc bulge and uncovertebral disease C4-5 without central canal or foraminal stenosis. Mild to moderate facet degenerative disease is worse on the left at this level.   Electronically Signed   By: Drusilla Kannerhomas  Dalessio M.D.   On: 04/05/2017 14:59   She reports that she has never smoked. She has never used smokeless tobacco.  Recent Labs    08/31/16 0926  HGBA1C 6.5    Objective:  VS:  HT:    WT:   BMI:     BP:(!) 178/96  HR:(!) 56bpm  TEMP:97.9 F (36.6 C)(Oral)  RESP:98 % Physical Exam  Musculoskeletal:  Patient sits with forward flexed cervical spine.  She does have pain with extension and rotation left more than right.  Neurological: No sensory deficit. She exhibits normal muscle tone.    Ortho Exam Imaging: Xr C-arm No Report  Result Date: 06/05/2017 Please see Notes or Procedures tab for imaging impression.   Past Medical/Family/Surgical/Social History: Medications & Allergies reviewed per EMR, new medications updated. Patient Active Problem List   Diagnosis Date Noted  . History of iron deficiency anemia 06/02/2017  . Light headedness 06/02/2017  .  Neck pain, chronic 05/22/2017  . Medication side effect 05/17/2017  . 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  . Chronic midline low back pain without sciatica 04/11/2017  . Generalized abdominal pain 04/11/2017  . Acute non-recurrent maxillary sinusitis 04/04/2017  . Arthritis 03/30/2017  . Anxiety 03/30/2017  . Pre-diabetes 08/31/2016  . Systolic hypertension 08/31/2016  . Coronary artery calcification seen on CAT scan 05/18/2016  . Essential hypertension 05/18/2016  . Type 2 diabetes mellitus without complication (HCC) 05/18/2016  . HLD (hyperlipidemia) 05/18/2016  . Plantar fasciitis 03/25/2016  . Anemia 10/12/2015  . Glaucoma 09/16/2015  . Pulmonary nodules 12/26/2012  . Chronic rhinitis 12/26/2012   Past Medical History:  Diagnosis Date  . Anxiety   . Cervicalgia   . CKD (chronic kidney disease) stage 3, GFR 30-59 ml/min (HCC)   . Coronary artery calcification seen on CAT scan 05/18/2016   Myoview 7/18: EF 67, no ischemia; Low Risk   . Diabetes mellitus (HCC)   . Environmental allergies   . FH: colonic polyps   . Fibromyalgia   . GERD (gastroesophageal reflux disease)   . Goiter   . Hematuria   . Hyperlipidemia   . Hypertension   . Osteoporosis   . Solitary pulmonary nodule     ;RUL - 7mm on CT 04/2014; CT 10/2014 resolution of nodule on RUL however persistant smaller nodules throughouts the lungs  . Weight loss    Family History  Problem Relation Age of Onset  . Heart disease Father   . Heart disease Brother   . Lung cancer Sister        smoked   Past Surgical History:  Procedure Laterality Date  . BACK SURGERY    . NASAL SINUS SURGERY    . NECK SURGERY     Social History   Occupational History  . Not on file  Tobacco Use  . Smoking status: Never Smoker  . Smokeless tobacco: Never Used  Substance and Sexual Activity  . Alcohol use: No  . Drug use: No  . Sexual activity: Not on file

## 2017-06-05 NOTE — Progress Notes (Signed)
 .  Numeric Pain Rating Scale and Functional Assessment Average Pain 9   In the last MONTH (on 0-10 scale) has pain interfered with the following?  1. General activity like being  able to carry out your everyday physical activities such as walking, climbing stairs, carrying groceries, or moving a chair?  Rating(5)   +Driver, -BT, -Dye Allergies.  

## 2017-06-08 ENCOUNTER — Telehealth: Payer: Self-pay | Admitting: Family Medicine

## 2017-06-08 NOTE — Telephone Encounter (Signed)
I received a phone call from patient's husband. He had been trying to find out about patient's lab results. He stated that they had called on Monday, but there was never a CRM or phone note put in. I went over her results with him and he verbalized understanding.

## 2017-06-14 ENCOUNTER — Ambulatory Visit: Payer: Self-pay

## 2017-06-14 NOTE — Telephone Encounter (Signed)
Pt. Reports she took her first dose of her Lyrica last night and states it caused this dizziness and "it didn't help my pain at all". The Tramadol helped my pain and I didn't have any side effects." States she needs something for her pain. Please advise pt.  Answer Assessment - Initial Assessment Questions 1. DESCRIPTION: "Describe your dizziness."     Dizzy 2. LIGHTHEADED: "Do you feel lightheaded?" (e.g., somewhat faint, woozy, weak upon standing)     Worse with movement 3. VERTIGO: "Do you feel like either you or the room is spinning or tilting?" (i.e. vertigo)     No 4. SEVERITY: "How bad is it?"  "Do you feel like you are going to faint?" "Can you stand and walk?"   - MILD - walking normally   - MODERATE - interferes with normal activities (e.g., work, school)    - SEVERE - unable to stand, requires support to walk, feels like passing out now.      Moderate 5. ONSET:  "When did the dizziness begin?"     This morning 6. AGGRAVATING FACTORS: "Does anything make it worse?" (e.g., standing, change in head position)     Movement 7. HEART RATE: "Can you tell me your heart rate?" "How many beats in 15 seconds?"  (Note: not all patients can do this)       No 8. CAUSE: "What do you think is causing the dizziness?"     I took my first dose of Lyrica last night. 9. RECURRENT SYMPTOM: "Have you had dizziness before?" If so, ask: "When was the last time?" "What happened that time?"     Yes -  10. OTHER SYMPTOMS: "Do you have any other symptoms?" (e.g., fever, chest pain, vomiting, diarrhea, bleeding)       Headache 11. PREGNANCY: "Is there any chance you are pregnant?" "When was your last menstrual period?"       No  Protocols used: DIZZINESS [facility]- LIGHTHEADEDNESS-A-AH

## 2017-06-14 NOTE — Telephone Encounter (Signed)
I called and spoke with patient. I advised her that Dr. Doreene BurkeKremer is out of the office until Monday with no access to his computer. I advised her that she would have to see another provider in order to receive any pain medication since Dr. Doreene BurkeKremer is not filling her Tramadol any longer. I advised patient to contact her orthopedic doctor to see if they will refill the medication since they did last month. Patient will stop taking the Lyrica and I advised her that I would forward this message to Dr. Doreene BurkeKremer to see on Monday. Patient will come in and see another provider if she is not getting any better.

## 2017-06-16 ENCOUNTER — Other Ambulatory Visit (INDEPENDENT_AMBULATORY_CARE_PROVIDER_SITE_OTHER): Payer: Self-pay | Admitting: Specialist

## 2017-06-16 NOTE — Telephone Encounter (Signed)
Tramadol refill Request 

## 2017-06-20 NOTE — Telephone Encounter (Signed)
Called rx to [facility] 

## 2017-06-21 ENCOUNTER — Encounter (INDEPENDENT_AMBULATORY_CARE_PROVIDER_SITE_OTHER): Payer: Self-pay | Admitting: Specialist

## 2017-06-21 ENCOUNTER — Ambulatory Visit (INDEPENDENT_AMBULATORY_CARE_PROVIDER_SITE_OTHER): Payer: Medicare Other | Admitting: Specialist

## 2017-06-21 ENCOUNTER — Other Ambulatory Visit: Payer: Self-pay | Admitting: Family Medicine

## 2017-06-21 VITALS — BP 143/77 | HR 57 | Ht 64.0 in | Wt 105.0 lb

## 2017-06-21 DIAGNOSIS — M47812 Spondylosis without myelopathy or radiculopathy, cervical region: Secondary | ICD-10-CM | POA: Diagnosis not present

## 2017-06-21 DIAGNOSIS — F419 Anxiety disorder, unspecified: Secondary | ICD-10-CM

## 2017-06-21 DIAGNOSIS — M4322 Fusion of spine, cervical region: Secondary | ICD-10-CM | POA: Diagnosis not present

## 2017-06-21 MED ORDER — TRAMADOL HCL 50 MG PO TABS: 50.0000 mg | ORAL_TABLET | Freq: Four times a day (QID) | ORAL | 0 refills | Status: DC | PRN

## 2017-06-21 MED ORDER — DICLOFENAC SODIUM 1 % TD GEL: 4.0000 g | Freq: Four times a day (QID) | TRANSDERMAL | 3 refills | Status: DC

## 2017-06-21 NOTE — Patient Instructions (Signed)
Avoid overhead lifting and overhead use of the arms. Do not lift greater than 5 lbs. Adjust head rest in vehicle to prevent hyperextension if rear ended. Take extra precautions to avoid falling. Try CBD oil locally and voltaren gel and if no improvement will request ESI on the right side at C7-T1 as the left ESI was of benefit.

## 2017-06-21 NOTE — Progress Notes (Signed)
Office Visit Note   Patient: Stephanie Henson           Date of Birth: 1945-08-21           MRN: [id number] Visit Date: 06/21/2017              Requested by: Mliss Sax, MD [address] [place], [place] [address] PCP: Mliss Sax, MD   Assessment & Plan: Visit Diagnoses:  1. Spondylosis of cervical region without myelopathy or radiculopathy   2. Fusion of spine of cervical region     Plan: Avoid overhead lifting and overhead use of the arms. Do not lift greater than 5 lbs. Adjust head rest in vehicle to prevent hyperextension if rear ended. Take extra precautions to avoid falling. Try CBD oil locally and voltaren gel and if no improvement will request ESI on the right side at C7-T1 as the left ESI was of benefit.    Follow-Up Instructions: Return in about 3 months (around 09/21/2017).   Orders:  No orders of the defined types were placed in this encounter.  No orders of the defined types were placed in this encounter.     Procedures: No procedures performed   Clinical Data: No additional findings.   Subjective: Chief Complaint  Patient presents with  . Neck - Follow-up    72 year old right handed female with long history of neck and left shoulder and arm pain. She underwent ACDF C5-6 and C6-7 2004. And she is having pain into the neck left greater tah right. She is taking tramadol intermittantly and has undergone ESI with some benefit. She notices a lump in the left upper cervicothoracic area that is hard. No bowel or bladder difficulty. She has had some dizziness for the last several weeks after bending over and hitting her head against a chair rail on the wall. She takes 81 mg ASA daily.   Review of Systems  Constitutional: Negative for activity change, appetite change, chills, diaphoresis, fatigue, fever and unexpected weight change.  HENT: Positive for rhinorrhea, sinus pressure and sinus pain. Negative for congestion.   Eyes:  Positive for pain. Negative for discharge, redness and itching.  Respiratory: Positive for cough and shortness of breath. Negative for apnea, choking, chest tightness, wheezing and stridor.   Cardiovascular: Positive for chest pain. Negative for palpitations and leg swelling.  Gastrointestinal: Negative for abdominal distention, abdominal pain, anal bleeding, blood in stool, constipation, diarrhea, nausea, rectal pain and vomiting.  Endocrine: Negative.  Negative for cold intolerance, heat intolerance, polydipsia, polyphagia and polyuria.  Genitourinary: Negative for difficulty urinating, dyspareunia, dysuria, enuresis, flank pain, frequency, genital sores and hematuria.  Musculoskeletal: Positive for back pain, neck pain and neck stiffness. Negative for arthralgias, gait problem and joint swelling.  Skin: Negative.  Negative for color change, pallor, rash and wound.  Allergic/Immunologic: Positive for environmental allergies.  Neurological: Positive for dizziness and light-headedness. Negative for tremors, seizures, syncope, facial asymmetry, speech difficulty, weakness, numbness and headaches.  Hematological: Negative.  Negative for adenopathy. Does not bruise/bleed easily.  Psychiatric/Behavioral: Negative.  Negative for agitation, behavioral problems, confusion, decreased concentration, dysphoric mood, hallucinations, self-injury, sleep disturbance and suicidal ideas. The patient is not nervous/anxious and is not hyperactive.      Objective: Vital Signs: BP (!) 143/77 (BP Location: Right Arm, Patient Position: Sitting, Cuff Size: Normal)   Pulse (!) 57   Ht  (1.626 m)   Wt 105 lb (47.6 kg)   BMI 18.02 kg/m  Physical Exam  Back Exam   Tenderness  The patient is experiencing tenderness in the cervical.  Range of Motion  Extension: abnormal  Flexion: normal  Lateral bend right: abnormal  Lateral bend left: abnormal  Rotation right: abnormal  Rotation left: abnormal    Muscle Strength  Right Quadriceps:  5/5  Left Quadriceps:  5/5  Right Hamstrings:  5/5  Left Hamstrings:  5/5   Tests  Straight leg raise right: negative Straight leg raise left: negative  Reflexes  Patellar: normal Achilles: normal Babinski's sign: normal   Other  Toe walk: normal Heel walk: normal Sensation: normal Gait: circumducted  Erythema: no back redness Scars: absent      Specialty Comments:  No specialty comments available.  Imaging: No results found.   PMFS History: Patient Active Problem List   Diagnosis Date Noted  . History of iron deficiency anemia 06/02/2017  . Light headedness 06/02/2017  . Neck pain, chronic 05/22/2017  . Medication side effect 05/17/2017  . 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  . Chronic midline low back pain without sciatica 04/11/2017  . Generalized abdominal pain 04/11/2017  . Acute non-recurrent maxillary sinusitis 04/04/2017  . Arthritis 03/30/2017  . Anxiety 03/30/2017  . Pre-diabetes 08/31/2016  . Systolic hypertension 08/31/2016  . Coronary artery calcification seen on CAT scan 05/18/2016  . Essential hypertension 05/18/2016  . Type 2 diabetes mellitus without complication (HCC) 05/18/2016  . HLD (hyperlipidemia) 05/18/2016  . Plantar fasciitis 03/25/2016  . Anemia 10/12/2015  . Glaucoma 09/16/2015  . Pulmonary nodules 12/26/2012  . Chronic rhinitis 12/26/2012   Past Medical History:  Diagnosis Date  . Anxiety   . Cervicalgia   . CKD (chronic kidney disease) stage 3, GFR 30-59 ml/min (HCC)   . Coronary artery calcification seen on CAT scan 05/18/2016   Myoview 7/18: EF 67, no ischemia; Low Risk   . Diabetes mellitus (HCC)   . Environmental allergies   . FH: colonic polyps   . Fibromyalgia   . GERD (gastroesophageal reflux disease)   . Goiter   . Hematuria   . Hyperlipidemia   . Hypertension   . Osteoporosis   . Solitary pulmonary nodule     ;RUL - 7mm on CT 04/2014; CT 10/2014 resolution of nodule on  RUL however persistant smaller nodules throughouts the lungs  . Weight loss     Family History  Problem Relation Age of Onset  . Heart disease Father   . Heart disease Brother   . Lung cancer Sister        smoked    Past Surgical History:  Procedure Laterality Date  . BACK SURGERY    . NASAL SINUS SURGERY    . NECK SURGERY     Social History   Occupational History  . Not on file  Tobacco Use  . Smoking status: Never Smoker  . Smokeless tobacco: Never Used  Substance and Sexual Activity  . Alcohol use: No  . Drug use: No  . Sexual activity: Not on file

## 2017-07-19 ENCOUNTER — Other Ambulatory Visit (INDEPENDENT_AMBULATORY_CARE_PROVIDER_SITE_OTHER): Payer: Self-pay | Admitting: Specialist

## 2017-07-20 NOTE — Telephone Encounter (Signed)
Tramadol refill request 

## 2017-07-21 NOTE — Telephone Encounter (Signed)
Called rx to [facility] 

## 2017-07-26 ENCOUNTER — Other Ambulatory Visit: Payer: Self-pay | Admitting: Family Medicine

## 2017-07-26 DIAGNOSIS — F419 Anxiety disorder, unspecified: Secondary | ICD-10-CM

## 2017-07-27 NOTE — Telephone Encounter (Signed)
30d appropriate/TA approve in [facility] absence/thx dmf

## 2017-08-09 ENCOUNTER — Other Ambulatory Visit: Payer: Self-pay

## 2017-08-09 ENCOUNTER — Ambulatory Visit (INDEPENDENT_AMBULATORY_CARE_PROVIDER_SITE_OTHER): Payer: Medicare Other | Admitting: Family Medicine

## 2017-08-09 ENCOUNTER — Encounter: Payer: Self-pay | Admitting: Family Medicine

## 2017-08-09 VITALS — BP 118/80 | HR 55 | Temp 98.9°F | Ht 64.0 in | Wt 111.0 lb

## 2017-08-09 DIAGNOSIS — J0100 Acute maxillary sinusitis, unspecified: Secondary | ICD-10-CM | POA: Diagnosis not present

## 2017-08-09 MED ORDER — AMOXICILLIN 500 MG PO CAPS: 500.0000 mg | ORAL_CAPSULE | Freq: Three times a day (TID) | ORAL | 0 refills | Status: DC

## 2017-08-09 NOTE — Assessment & Plan Note (Signed)
Maintain adequate hydration May use OTC medication for symptomatic control Rx for amoxicillin Call or RTC if not improving.

## 2017-08-09 NOTE — Patient Instructions (Signed)

## 2017-08-09 NOTE — Progress Notes (Signed)
Stephanie Henson - 72 y.o. female MRN [id number]  Date of birth: 06-14-1945  Subjective Chief Complaint  Patient presents with  . Sinusitis    started today with a sinus headache, ears hurt, back of head hurts. no cough or sore throat     HPI  Stephanie Henson is a 72 y.o. female who presents for evaluation of sinus pain. Symptoms include: congestion, facial pain, headaches, nasal congestion, sinus pressure and chills. Onset of symptoms was 1 day ago. Symptoms have been rapidly worsening since that time. Tried allergy medication without much improvement. Past history is significant for occasional episodes of sinusitis. Patient is a non-smoker.  The following portions of the patient's history were reviewed and updated as appropriate: allergies, current medications, past family history, past medical history, past social history, past surgical history and problem list   ROS: ROS completed and negative except as noted per HPI. Allergies  Allergen Reactions  . Amitriptyline Other (See Comments)    Hearing loss  . Asa [Aspirin] Nausea Only    Only 325 mg dose   . Ciprofloxacin Other (See Comments)    Hearing loss  . Codeine Other (See Comments)    Heart flutters  . Lyrica [Pregabalin]   . Sudafed [Pseudoephedrine Hcl]   . Tylenol [Acetaminophen] Palpitations    Past Medical History:  Diagnosis Date  . Anxiety   . Cervicalgia   . CKD (chronic kidney disease) stage 3, GFR 30-59 ml/min (HCC)   . Coronary artery calcification seen on CAT scan 05/18/2016   Myoview 7/18: EF 67, no ischemia; Low Risk   . Diabetes mellitus (HCC)   . Environmental allergies   . FH: colonic polyps   . Fibromyalgia   . GERD (gastroesophageal reflux disease)   . Goiter   . Hematuria   . Hyperlipidemia   . Hypertension   . Osteoporosis   . Solitary pulmonary nodule     ;RUL - 7mm on CT 04/2014; CT 10/2014 resolution of nodule on RUL however persistant smaller nodules throughouts the lungs  . Weight loss      Past Surgical History:  Procedure Laterality Date  . BACK SURGERY    . NASAL SINUS SURGERY    . NECK SURGERY      Social History   Socioeconomic History  . Marital status: Married    Spouse name: Not on file  . Number of children: Not on file  . Years of education: Not on file  . Highest education level: Not on file  Occupational History  . Not on file  Social Needs  . Financial resource strain: Not on file  . Food insecurity:    Worry: Not on file    Inability: Not on file  . Transportation needs:    Medical: Not on file    Non-medical: Not on file  Tobacco Use  . Smoking status: Never Smoker  . Smokeless tobacco: Never Used  Substance and Sexual Activity  . Alcohol use: No  . Drug use: No  . Sexual activity: Not on file  Lifestyle  . Physical activity:    Days per week: Not on file    Minutes per session: Not on file  . Stress: Not on file  Relationships  . Social connections:    Talks on phone: Not on file    Gets together: Not on file    Attends religious service: Not on file    Active member of club or organization: Not on file  Attends meetings of clubs or organizations: Not on file    Relationship status: Not on file  Other Topics Concern  . Not on file  Social History Narrative  . Not on file    Family History  Problem Relation Age of Onset  . Heart disease Father   . Heart disease Brother   . Lung cancer Sister        smoked    Health Maintenance  Topic Date Due  . FOOT EXAM  05/17/1955  . OPHTHALMOLOGY EXAM  05/17/1955  . URINE MICROALBUMIN  05/17/1955  . COLONOSCOPY  05/17/1995  . HEMOGLOBIN A1C  03/03/2017  . PNA vac Low Risk Adult (1 of 2 - PCV13) 04/17/2026 (Originally 05/17/2010)  . INFLUENZA VACCINE  12/12/2030 (Originally 09/21/2017)  . MAMMOGRAM  12/13/2018  . TETANUS/TDAP  09/15/2025  . DEXA SCAN  Completed  . Hepatitis C Screening  Completed     ----------------------------------------------------------------------------------------------------------------------------------------------------------------------------------------------------------------- Physical Exam BP 118/80 (BP Location: Left Arm, Patient Position: Sitting, Cuff Size: Normal)   Pulse (!) 55   Temp 98.9 F (37.2 C) (Oral)   Ht 5\' 4"  (1.626 m)   Wt 111 lb (50.3 kg)   SpO2 92%   BMI 19.05 kg/m   Physical Exam  Constitutional: She is oriented to person, place, and time. She appears well-nourished. No distress.  HENT:  Mouth/Throat: Oropharynx is clear and moist.  Normal TM bilaterally TTP along bilateral maxillary sinuses.  Turbinates inflamed.   Eyes: No scleral icterus.  Neck: Neck supple. No thyromegaly present.  Cardiovascular: Normal rate, regular rhythm and normal heart sounds.  Pulmonary/Chest: Effort normal.  Lymphadenopathy:    She has no cervical adenopathy.  Neurological: She is alert and oriented to person, place, and time. No cranial nerve deficit.  Skin: Skin is warm and dry. No rash noted.  Psychiatric: She has a normal mood and affect. Her behavior is normal.    ------------------------------------------------------------------------------------------------------------------------------------------------------------------------------------------------------------------- Assessment and Plan  Acute non-recurrent maxillary sinusitis Maintain adequate hydration May use OTC medication for symptomatic control Rx for amoxicillin Call or RTC if not improving.

## 2017-08-11 ENCOUNTER — Other Ambulatory Visit (INDEPENDENT_AMBULATORY_CARE_PROVIDER_SITE_OTHER): Payer: Self-pay | Admitting: Specialist

## 2017-08-11 NOTE — Telephone Encounter (Signed)
Ok to refill medication 

## 2017-08-11 NOTE — Telephone Encounter (Signed)
Patient called wanting to let Dr. Otelia SergeantNitka know that she hasn't been using the voltaren gel because the smell is too strong for her and she also hasn't been taking the hemp oil because it makes her feel bad. She would like a refill on her Tramadol. CB # [phone]

## 2017-08-14 ENCOUNTER — Other Ambulatory Visit (INDEPENDENT_AMBULATORY_CARE_PROVIDER_SITE_OTHER): Payer: Self-pay | Admitting: Specialist

## 2017-08-14 MED ORDER — TRAMADOL HCL 50 MG PO TABS: 50.0000 mg | ORAL_TABLET | Freq: Four times a day (QID) | ORAL | 1 refills | Status: DC | PRN

## 2017-08-14 NOTE — Addendum Note (Signed)
Addended by: SHUE WILLPenne LashS, Otis DialsHRISTY N on: 08/14/2017 02:33 PM   Modules accepted: Orders

## 2017-08-14 NOTE — Telephone Encounter (Signed)
Sent request to Dr. NItka °

## 2017-08-14 NOTE — Telephone Encounter (Signed)
Called rx to [facility] 

## 2017-08-16 ENCOUNTER — Telehealth: Payer: Self-pay | Admitting: Family Medicine

## 2017-08-16 DIAGNOSIS — J329 Chronic sinusitis, unspecified: Secondary | ICD-10-CM

## 2017-08-16 MED ORDER — PREDNISONE 20 MG PO TABS: 20.0000 mg | ORAL_TABLET | Freq: Two times a day (BID) | ORAL | 0 refills | Status: AC

## 2017-08-16 NOTE — Telephone Encounter (Signed)
She can rtc so that we can check some sinus films.

## 2017-08-16 NOTE — Telephone Encounter (Signed)
Brief course of prednisone sent to pharm. Fu if not improving with this.

## 2017-08-16 NOTE — Telephone Encounter (Signed)
Contact pt to advise on medication; pt states that she cannot take predniSONE (DELTASONE) 20 MG tablet [[id number]][[id number]]

## 2017-08-16 NOTE — Telephone Encounter (Signed)
Pt stated it was along time ago that she took prednisone and it made her itch. She cant take this med. Please advise.

## 2017-08-16 NOTE — Telephone Encounter (Signed)
Copied from CRM [id number]#[id number]. Topic: Quick Communication - See Telephone Encounter >> Aug 16, 2017  9:50 AM Floria RavelingStovall, Shana A wrote: CRM for notification. See Telephone encounter for: 08/16/17. Pt called in and stated that she was seen last week but still not any better.  She stated that her ear , head and eyes still hurt.  She stated she fill like she has lack of energy.  She only has 1 or 2 tabs left but not any better .  Please advise   Pharmacy [facility] [address]

## 2017-08-16 NOTE — Telephone Encounter (Signed)
Call pt and offer an appt with Dr. Doreene BurkeKremer. Pt stated she going to try prednisone and will report to us if any side effect occur.

## 2017-08-19 ENCOUNTER — Encounter (HOSPITAL_COMMUNITY): Payer: Self-pay

## 2017-08-19 ENCOUNTER — Emergency Department (HOSPITAL_COMMUNITY): Payer: Medicare Other

## 2017-08-19 ENCOUNTER — Emergency Department (HOSPITAL_COMMUNITY)
Admission: EM | Admit: 2017-08-19 | Discharge: 2017-08-19 | Disposition: A | Discharge status: Home / Self Care | Payer: Medicare Other | Attending: Emergency Medicine | Admitting: Emergency Medicine

## 2017-08-19 DIAGNOSIS — G4489 Other headache syndrome: Secondary | ICD-10-CM | POA: Insufficient documentation

## 2017-08-19 DIAGNOSIS — E119 Type 2 diabetes mellitus without complications: Secondary | ICD-10-CM | POA: Insufficient documentation

## 2017-08-19 DIAGNOSIS — Z7982 Long term (current) use of aspirin: Secondary | ICD-10-CM | POA: Insufficient documentation

## 2017-08-19 DIAGNOSIS — I10 Essential (primary) hypertension: Secondary | ICD-10-CM

## 2017-08-19 DIAGNOSIS — I129 Hypertensive chronic kidney disease with stage 1 through stage 4 chronic kidney disease, or unspecified chronic kidney disease: Secondary | ICD-10-CM | POA: Insufficient documentation

## 2017-08-19 DIAGNOSIS — N183 Chronic kidney disease, stage 3 (moderate): Secondary | ICD-10-CM | POA: Insufficient documentation

## 2017-08-19 DIAGNOSIS — Z79899 Other long term (current) drug therapy: Secondary | ICD-10-CM | POA: Insufficient documentation

## 2017-08-19 DIAGNOSIS — R51 Headache: Secondary | ICD-10-CM | POA: Diagnosis present

## 2017-08-19 MED ORDER — LORAZEPAM 2 MG/ML IJ SOLN
1.0000 mg | Freq: Once | INTRAMUSCULAR | Status: AC
  Administered 2017-08-19: 1 mg via INTRAVENOUS
  Filled 2017-08-19: qty 1

## 2017-08-19 MED ORDER — KETOROLAC TROMETHAMINE 30 MG/ML IJ SOLN
15.0000 mg | Freq: Once | INTRAMUSCULAR | Status: AC
  Administered 2017-08-19: 15 mg via INTRAVENOUS
  Filled 2017-08-19: qty 1

## 2017-08-19 MED ORDER — SODIUM CHLORIDE 0.9 % IV BOLUS
1000.0000 mL | Freq: Once | INTRAVENOUS | Status: AC
  Administered 2017-08-19: 1000 mL via INTRAVENOUS

## 2017-08-19 MED ORDER — LORAZEPAM 2 MG/ML IJ SOLN
0.5000 mg | Freq: Once | INTRAMUSCULAR | Status: AC
  Administered 2017-08-19: 0.5 mg via INTRAVENOUS
  Filled 2017-08-19: qty 1

## 2017-08-19 MED ORDER — METOCLOPRAMIDE HCL 5 MG/ML IJ SOLN
10.0000 mg | Freq: Once | INTRAMUSCULAR | Status: AC
  Administered 2017-08-19: 10 mg via INTRAVENOUS
  Filled 2017-08-19: qty 2

## 2017-08-19 MED ORDER — DIPHENHYDRAMINE HCL 50 MG/ML IJ SOLN
25.0000 mg | Freq: Once | INTRAMUSCULAR | Status: AC
  Administered 2017-08-19: 25 mg via INTRAVENOUS
  Filled 2017-08-19: qty 1

## 2017-08-19 MED ORDER — MORPHINE SULFATE (PF) 2 MG/ML IV SOLN
2.0000 mg | Freq: Once | INTRAVENOUS | Status: AC
  Administered 2017-08-19: 2 mg via INTRAVENOUS
  Filled 2017-08-19: qty 1

## 2017-08-19 MED ORDER — AMLODIPINE BESYLATE 5 MG PO TABS: 5.0000 mg | ORAL_TABLET | Freq: Every day | ORAL | 0 refills | Status: AC

## 2017-08-19 NOTE — Discharge Instructions (Signed)
1.  Start amlodipine 1 tablet daily.  You need to see your family doctor this week for monitoring of your blood pressure. 2.  Return to the emergency department if you have changing or worsening symptoms.

## 2017-08-19 NOTE — ED Triage Notes (Signed)
Patient [facility] ed [facility] with c/o headache, tremor and generalized weakness. Patient state she had this headache for 2 weeks.

## 2017-08-19 NOTE — ED Notes (Signed)
Discharge instructions reviewed with pt. Pt verbalized understanding. Pt to follow up with PCP this week. PIV removed. Pt brought out to waiting room via wheelchair.

## 2017-08-19 NOTE — ED Notes (Signed)
Pt allowed to eat per provider ok

## 2017-08-19 NOTE — ED Provider Notes (Signed)
[facility]-EMERGENCY DEPT Provider Note   CSN: [id number] Arrival date & time: 08/19/17  1712     History   Chief Complaint No chief complaint on file.   HPI Stephanie Henson is a 72 y.o. female.  HPI She reports she had a headache for 2 weeks.  It is gradual in onset.  Headache is on the frontal and right side of her head to the back of her head and neck.  Aching throbbing and burning in quality.  Patient reports it got so bad today that she got weak all over her whole body and started trembling everywhere.  She specifically denies any focal weakness numbness or visual changes.  She reports that she has had problems with headaches in the past.  She reports she has had them since she was very young.  Typically she gets a couple of headaches a month.  They may last for a day or so but then usually start to resolve.  She however denies she is ever been formally diagnosed with migraines.  She reports is always a "sinus headache".  She is also had chronic neck pain for some time.  She wears Lidoderm patches.  Patient also has history of spondylolysis of the cervical region and history of cervical facet joint nerve blocks last documented in the chart 4\2019.  Patient's family members in the room report that she has severe anxiety.  They report that she has taken Ativan for years but will get extremely anxious at certain times and have almost panic attacks.  It is their opinion that her anxiety may need to be better controlled.  Patient reports that she regularly takes lorazepam 0.5 mg.  She denies that she is stopped taking her to run out of it. Patient denies that she has hypertension.  She does however advised that she was supposed to be taking losartan which have been prescribed early in the year for her.  She reports she only took it for a few days and then because of her concern for the side effect profile discontinued it.  She reports her doctor never said she had to restart  another medication.  She reports she does keep an eye on her blood pressures and typically they read normal.  EMR indicates her visit on 4\12\2019 in office reading was 138/84.  However noted blood pressure readings from prior to encounters that month were 180/95 and 168/90.  Diagnosis of essential hypertension is listed in the chart.   No fevers, no chills, no visual changes.  No imbalance, no focal weakness numbness or tingling.  No confusion or slurred speech. Past Medical History:  Diagnosis Date  . Anxiety   . Cervicalgia   . CKD (chronic kidney disease) stage 3, GFR 30-59 ml/min (HCC)   . Coronary artery calcification seen on CAT scan 05/18/2016   Myoview 7/18: EF 67, no ischemia; Low Risk   . Diabetes mellitus (HCC)   . Environmental allergies   . FH: colonic polyps   . Fibromyalgia   . GERD (gastroesophageal reflux disease)   . Goiter   . Hematuria   . Hyperlipidemia   . Hypertension   . Osteoporosis   . Solitary pulmonary nodule     ;RUL - 7mm on CT 04/2014; CT 10/2014 resolution of nodule on RUL however persistant smaller nodules throughouts the lungs  . Weight loss     Patient Active Problem List   Diagnosis Date Noted  . History of iron deficiency anemia 06/02/2017  .  Light headedness 06/02/2017  . Neck pain, chronic 05/22/2017  . Medication side effect 05/17/2017  . 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  . Chronic midline low back pain without sciatica 04/11/2017  . Generalized abdominal pain 04/11/2017  . Acute non-recurrent maxillary sinusitis 04/04/2017  . Arthritis 03/30/2017  . Anxiety 03/30/2017  . Pre-diabetes 08/31/2016  . Systolic hypertension 08/31/2016  . Coronary artery calcification seen on CAT scan 05/18/2016  . Essential hypertension 05/18/2016  . Type 2 diabetes mellitus without complication (HCC) 05/18/2016  . HLD (hyperlipidemia) 05/18/2016  . Plantar fasciitis 03/25/2016  . Anemia 10/12/2015  . Glaucoma 09/16/2015  . Pulmonary nodules 12/26/2012    . Chronic rhinitis 12/26/2012    Past Surgical History:  Procedure Laterality Date  . BACK SURGERY    . NASAL SINUS SURGERY    . NECK SURGERY       OB History   None      Home Medications    Prior to Admission medications   Medication Sig Start Date End Date Taking? Authorizing Provider  ALPHAGAN P 0.1 % SOLN INSTILL 1 DROP IN BOTH EYES THREE TIMES A DAY 12/24/14  Yes [provider]  amoxicillin-clavulanate (AUGMENTIN) 875-125 MG tablet Take 1 tablet by mouth 2 (two) times daily.  08/19/17  Yes [provider]  aspirin 81 MG tablet Take 81 mg by mouth daily.   Yes [provider]  BETIMOL 0.5 % ophthalmic solution Place 1 drop into both eyes daily. Each eye once per day 11/06/12  Yes [provider]  CAPSAICIN HOT PATCH EX Apply 1 patch topically daily.    Yes [provider]  cholecalciferol (VITAMIN D) 1000 units tablet Take 1,000 Units by mouth daily.   Yes [provider]  fluticasone (FLONASE) 50 MCG/ACT nasal spray Place 1 spray into both nostrils daily. 04/04/17  Yes Mliss Sax, MD  latanoprost (XALATAN) 0.005 % ophthalmic solution Place 1 drop into both eyes at bedtime.  12/22/12  Yes [provider]  LORazepam (ATIVAN) 0.5 MG tablet TAKE 1 TABLET BY MOUTH TWICE A DAY 07/27/17  Yes Dianne Dun, MD  LOTEMAX 0.5 % GEL Place 1 drop into both eyes 4 (four) times daily. 05/18/17  Yes [provider]  Omega-3 Fatty Acids (FISH OIL) 1000 MG CAPS Take 2 capsules by mouth daily.    Yes [provider]  predniSONE (DELTASONE) 20 MG tablet Take 1 tablet (20 mg total) by mouth 2 (two) times daily with a meal for 5 days. 08/16/17 08/21/17 Yes Mliss Sax, MD  rosuvastatin (CRESTOR) 10 MG tablet Take 10 mg by mouth every evening.  02/04/16  Yes [provider]  traMADol (ULTRAM) 50 MG tablet Take 1 tablet (50 mg total) by mouth every 6 (six) hours as needed. for pain 08/14/17  Yes  Kerrin Champagne, MD  vitamin B-12 (CYANOCOBALAMIN) 1000 MCG tablet Take 1,000 mcg by mouth daily.   Yes [provider]  amLODipine (NORVASC) 5 MG tablet Take 1 tablet (5 mg total) by mouth daily. 08/19/17   Arby Barrette, MD  amoxicillin (AMOXIL) 500 MG capsule Take 1 capsule (500 mg total) by mouth 3 (three) times daily. Patient not taking: Reported on 08/19/2017 08/09/17   Everrett Coombe, DO  diclofenac sodium (VOLTAREN) 1 % GEL Apply 4 g topically 4 (four) times daily. Patient not taking: Reported on 08/19/2017 06/21/17   Kerrin Champagne, MD  omeprazole (PRILOSEC) 20 MG capsule Take 1 capsule (20 mg total) by mouth daily.  Patient not taking: Reported on 08/19/2017 05/18/17   Doristine Bosworth, MD  [facility] DELICA LANCETS FINE MISC daily. for testing 07/28/17   [provider]  [facility] VERIO test strip 1 STRIP ONCE A DAY DX E11.9 FINGERSTICK 90 08/01/17   [provider]  pregabalin (LYRICA) 75 MG capsule Take one at night for a week and then one twice each day. Patient not taking: Reported on 08/19/2017 06/02/17   Mliss Sax, MD    Family History Family History  Problem Relation Age of Onset  . Heart disease Father   . Heart disease Brother   . Lung cancer Sister        smoked    Social History Social History   Tobacco Use  . Smoking status: Never Smoker  . Smokeless tobacco: Never Used  Substance Use Topics  . Alcohol use: No  . Drug use: No     Allergies   Amitriptyline; Asa [aspirin]; Ciprofloxacin; Codeine; Lyrica [pregabalin]; Sudafed [pseudoephedrine hcl]; and Tylenol [acetaminophen]   Review of Systems Review of Systems 10 Systems reviewed and are negative for acute change except as noted in the HPI.   Physical Exam Updated Vital Signs BP (!) 171/72   Pulse 73   Temp (!) 97.5 F (36.4 C) (Oral)   Resp 18   SpO2 99%   Physical Exam  Constitutional: She is oriented to person, place, and time. She appears well-developed and  well-nourished.  Patient is alert and appropriate.  She appears uncomfortable but is not tearful and has normal mental status.  She is not somnolent.  HENT:  Head: Normocephalic and atraumatic.  Bilateral TMs serous effusion.  No erythema no bulging.  Posterior oropharynx widely patent.  No facial swelling.  Patient endorses tenderness to palpation along her forehead and the right temple.  Eyes: Pupils are equal, round, and reactive to light. EOM are normal.  Neck: Neck supple.  Cardiovascular: Normal rate, regular rhythm, normal heart sounds and intact distal pulses.  Pulmonary/Chest: Effort normal and breath sounds normal.  Abdominal: Soft. Bowel sounds are normal. She exhibits no distension. There is no tenderness.  Musculoskeletal: Normal range of motion. She exhibits no edema.  Neurological: She is alert and oriented to person, place, and time. She has normal strength. No cranial nerve deficit. She exhibits normal muscle tone. Coordination normal. GCS eye subscore is 4. GCS verbal subscore is 5. GCS motor subscore is 6.  Skin: Skin is warm, dry and intact.  Psychiatric: She has a normal mood and affect.     ED Treatments / Results  Labs (all labs ordered are listed, but only abnormal results are displayed) Labs Reviewed - No data to display  EKG None  Radiology Ct Head Wo Contrast  Result Date: 08/19/2017 CLINICAL DATA:  Headache, tremor, and generalized weakness. EXAM: CT HEAD WITHOUT CONTRAST TECHNIQUE: Contiguous axial images were obtained from the base of the skull through the vertex without intravenous contrast. COMPARISON:  May 28, 2017 FINDINGS: Brain: No subdural, epidural, or subarachnoid hemorrhage. Cerebellum, brainstem, and basal cisterns are normal. Ventricles and sulci are unremarkable. No acute cortical ischemia or infarct. No mass effect or midline shift. Vascular: No hyperdense vessel or unexpected calcification. Skull: Normal. Negative for fracture or focal lesion.  Sinuses/Orbits: No acute finding. Other: None. IMPRESSION: No acute intracranial abnormalities. Electronically Signed   By: Gerome Sam III M.D   On: 08/19/2017 20:05    Procedures Procedures (including critical care time)  Medications Ordered in ED Medications  metoCLOPramide (REGLAN) injection 10 mg (10 mg Intravenous Given 08/19/17 1853)  diphenhydrAMINE (BENADRYL) injection 25 mg (25 mg Intravenous Given 08/19/17 1852)  ketorolac (TORADOL) 30 MG/ML injection 15 mg (15 mg Intravenous Given 08/19/17 1853)  sodium chloride 0.9 % bolus 1,000 mL (0 mLs Intravenous Stopped 08/19/17 2005)  LORazepam (ATIVAN) injection 1 mg (1 mg Intravenous Given 08/19/17 2002)  morphine 2 MG/ML injection 2 mg (2 mg Intravenous Given 08/19/17 2002)  LORazepam (ATIVAN) injection 0.5 mg (0.5 mg Intravenous Given 08/19/17 2056)  morphine 2 MG/ML injection 2 mg (2 mg Intravenous Given 08/19/17 2057)     Initial Impression / Assessment and Plan / ED Course  I have reviewed the triage vital signs and the nursing notes.  Pertinent labs & imaging results that were available during my care of the patient were reviewed by me and considered in my medical decision making (see chart for details).    Recheck: Clinically well appearance.  Pressures come down to 150s over 80s without antihypertensives.  Headache resolved.  Final Clinical Impressions(s) / ED Diagnoses   Final diagnoses:  Other headache syndrome  Essential hypertension   Patient presents with headaches it is unilateral and forehead and into her neck.  Patient has had chronic headaches for many years.  She is also had chronic neck pain.  She was hypertensive on arrival.  She however did not show any signs of encephalopathy.  No blurred vision or double vision.  No chest pain, no dyspnea.  No signs of endorgan damage.  She denied hypertension however her EMR showed visits to PCP with gnosis of essential hypertension and documented pressures of 180s over 90s.   Patient's husband reports she is supposed to be taking medication but she stopped because she got afraid after she seen TV commercials about side effects.  Patient husband requests that she would be restarted on her blood pressure medication.  Patient apparently also has significant anxiety.  She does take Ativan daily which she has done for many years.  Once anxiety was controlled in the emergency department, patient appeared much more comfortable.  She was appropriately interactive without any signs of confusion or oversedation.  Return precautions are reviewed with patient and family members.  She reports she will follow-up with PCP at the beginning of the week to discuss initiating blood pressure medication.  She is been given a prescription for Norvasc 5 mg daily to initiate. ED Discharge Orders        Ordered    amLODipine (NORVASC) 5 MG tablet  Daily     08/19/17 2159       Arby Barrette, MD 08/19/17 2207

## 2017-08-19 NOTE — ED Notes (Signed)
Bed: ZO10WA19 Expected date:  Expected time:  Means of arrival:  Comments: 72 yo HA

## 2017-09-21 ENCOUNTER — Ambulatory Visit (INDEPENDENT_AMBULATORY_CARE_PROVIDER_SITE_OTHER): Payer: Medicare Other | Admitting: Specialist

## 2018-02-08 ENCOUNTER — Ambulatory Visit (INDEPENDENT_AMBULATORY_CARE_PROVIDER_SITE_OTHER): Payer: Medicare Other | Admitting: Orthopedic Surgery

## 2018-02-08 ENCOUNTER — Encounter (INDEPENDENT_AMBULATORY_CARE_PROVIDER_SITE_OTHER): Payer: Self-pay | Admitting: Physician Assistant

## 2018-02-08 VITALS — Ht 64.0 in | Wt 111.0 lb

## 2018-02-08 DIAGNOSIS — M6701 Short Achilles tendon (acquired), right ankle: Secondary | ICD-10-CM | POA: Diagnosis not present

## 2018-02-08 DIAGNOSIS — M6702 Short Achilles tendon (acquired), left ankle: Secondary | ICD-10-CM

## 2018-02-08 DIAGNOSIS — M79672 Pain in left foot: Secondary | ICD-10-CM

## 2018-02-08 DIAGNOSIS — M79671 Pain in right foot: Secondary | ICD-10-CM

## 2018-02-09 ENCOUNTER — Encounter (INDEPENDENT_AMBULATORY_CARE_PROVIDER_SITE_OTHER): Payer: Self-pay | Admitting: Orthopedic Surgery

## 2018-02-09 NOTE — Progress Notes (Signed)
Office Visit Note   Patient: Stephanie Henson           Date of Birth: 29-Nov-1945           MRN: [id number] Visit Date: 02/08/2018              Requested by: Mliss SaxKremer, William Alfred, MD [address] [place], [place] [id number] PCP: System, Pcp Not In  Chief Complaint  Patient presents with  . Left Foot - Pain    New patient  . Right Foot - Pain      HPI: Patient is a 72 year old woman borderline diabetes who presents complaining of burning pain bilaterally.  She states that she may have neuropathy.  Assessment & Plan: Visit Diagnoses:  1. Pain in left foot   2. Pain in right foot     Plan: Recommended heel cord stretching this was demonstrated to her recommended a padded Trail running sneaker such as [place] with sole orthotics.  Follow-Up Instructions: Return if symptoms worsen or fail to improve.   Ortho Exam  Patient is alert, oriented, no adenopathy, well-dressed, normal affect, normal respiratory effort. Examination patient has good pulses bilaterally.  She does have Achilles contracture with dorsiflexion about to neutral.  The Achilles is nontender to palpation she does have tenderness to palpation over the plantar aspect of the calcaneus as well as over the origin of the plantar fascia.  Patient has essentially no fat pad on the plantar aspect of the calcaneus and essentially has skin and bone.  She also has distal migration of the fat pad beneath the metatarsal heads bilaterally and also has skin and bone beneath the metatarsal heads there are no ulcers.  Imaging: No results found. No images are attached to the encounter.  Labs: Lab Results  Component Value Date   HGBA1C 6.5 08/31/2016   LABURIC 5.1 05/28/2016   LABURIC 5.3 10/12/2015   REPTSTATUS 03/02/2015 FINAL 02/26/2015   GRAMSTAIN  02/26/2015    CYTOSPIN NO WBC SEEN NO ORGANISMS SEEN Gram Stain Report Called to,Read Back By and Verified With: T Gaynelle AduNEILSON RN [phone] 02/27/15 A NAVARRO    CULT   02/26/2015    NO GROWTH 3 DAYS [facility]      Lab Results  Component Value Date   ALBUMIN 3.9 05/28/2017   ALBUMIN 4.4 04/11/2017   ALBUMIN 4.6 08/31/2016   LABURIC 5.1 05/28/2016   LABURIC 5.3 10/12/2015    Body mass index is 19.05 kg/m.  Orders:  No orders of the defined types were placed in this encounter.  No orders of the defined types were placed in this encounter.    Procedures: No procedures performed  Clinical Data: No additional findings.  ROS:  All other systems negative, except as noted in the HPI. Review of Systems  Objective: Vital Signs: Ht 5\' 4"  (1.626 m)   Wt 111 lb (50.3 kg)   BMI 19.05 kg/m   Specialty Comments:  No specialty comments available.  PMFS History: Patient Active Problem List   Diagnosis Date Noted  . History of iron deficiency anemia 06/02/2017  . Light headedness 06/02/2017  . Neck pain, chronic 05/22/2017  . Medication side effect 05/17/2017  . 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  . Chronic midline low back pain without sciatica 04/11/2017  . Generalized abdominal pain 04/11/2017  . Acute non-recurrent maxillary sinusitis 04/04/2017  . Arthritis 03/30/2017  . Anxiety 03/30/2017  . Pre-diabetes 08/31/2016  . Systolic hypertension 08/31/2016  . Coronary artery calcification  seen on CAT scan 05/18/2016  . Essential hypertension 05/18/2016  . Type 2 diabetes mellitus without complication (HCC) 05/18/2016  . HLD (hyperlipidemia) 05/18/2016  . Plantar fasciitis 03/25/2016  . Anemia 10/12/2015  . Glaucoma 09/16/2015  . Pulmonary nodules 12/26/2012  . Chronic rhinitis 12/26/2012   Past Medical History:  Diagnosis Date  . Anxiety   . Cervicalgia   . CKD (chronic kidney disease) stage 3, GFR 30-59 ml/min (HCC)   . Coronary artery calcification seen on CAT scan 05/18/2016   Myoview 7/18: EF 67, no ischemia; Low Risk   . Diabetes mellitus (HCC)   . Environmental allergies   . FH: colonic polyps   .  Fibromyalgia   . GERD (gastroesophageal reflux disease)   . Goiter   . Hematuria   . Hyperlipidemia   . Hypertension   . Osteoporosis   . Solitary pulmonary nodule     ;RUL - 7mm on CT 04/2014; CT 10/2014 resolution of nodule on RUL however persistant smaller nodules throughouts the lungs  . Weight loss     Family History  Problem Relation Age of Onset  . Heart disease Father   . Heart disease Brother   . Lung cancer Sister        smoked    Past Surgical History:  Procedure Laterality Date  . BACK SURGERY    . NASAL SINUS SURGERY    . NECK SURGERY     Social History   Occupational History  . Not on file  Tobacco Use  . Smoking status: Never Smoker  . Smokeless tobacco: Never Used  Substance and Sexual Activity  . Alcohol use: No  . Drug use: No  . Sexual activity: Not on file

## 2018-10-25 IMAGING — CR DG CERVICAL SPINE COMPLETE 4+V
5 series · 5 of 5 positions shown · non-contrast
Comparison: None.

CLINICAL DATA: Chronic neck pain, worsening on the left

EXAM:
CERVICAL SPINE - COMPLETE 4+ VIEW

[w c-spine lat]
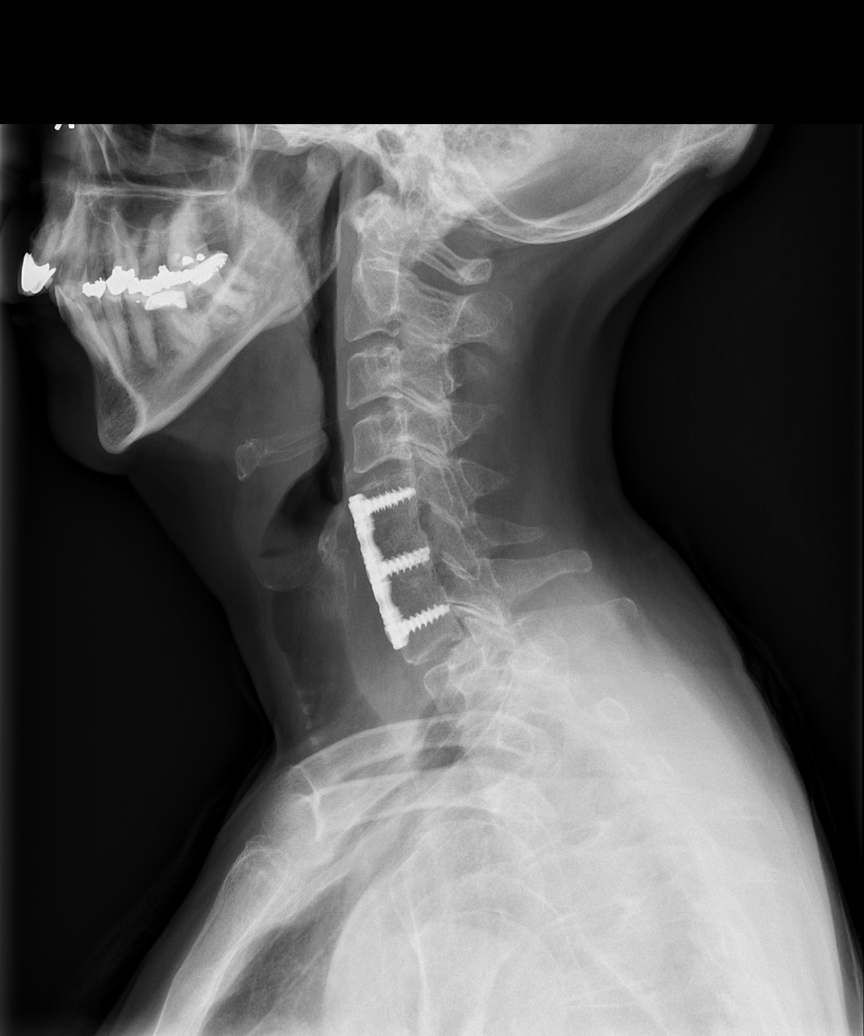

[w c-spine oblique (1 of 2)]
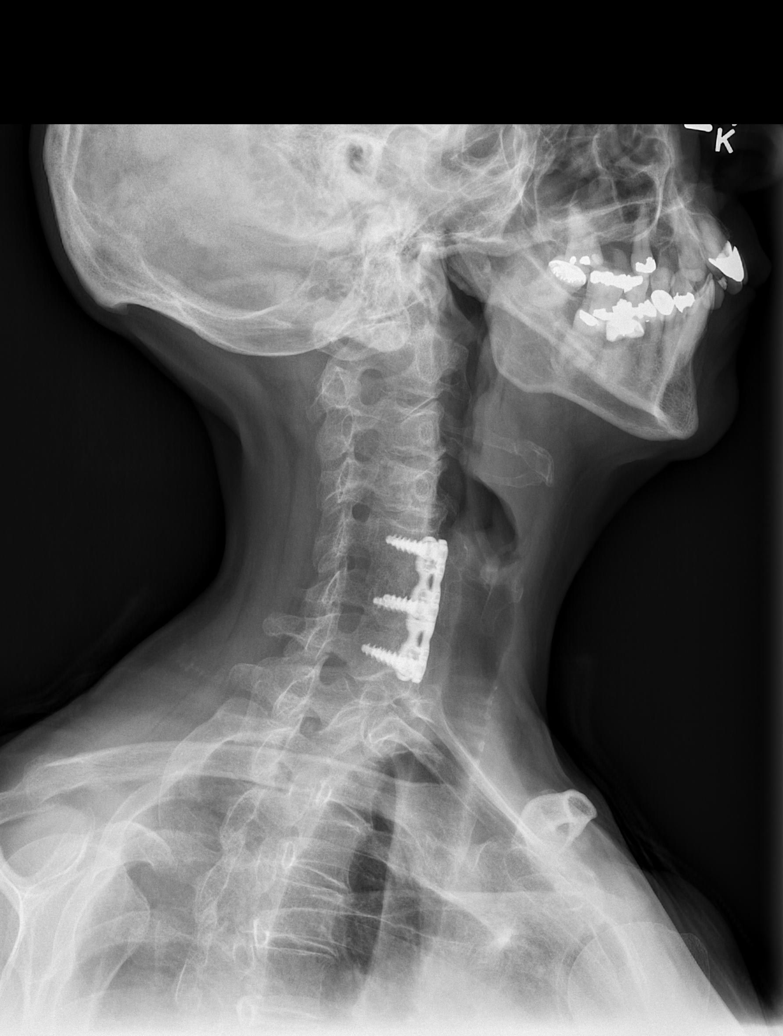

[w c-spine oblique (2 of 2)]
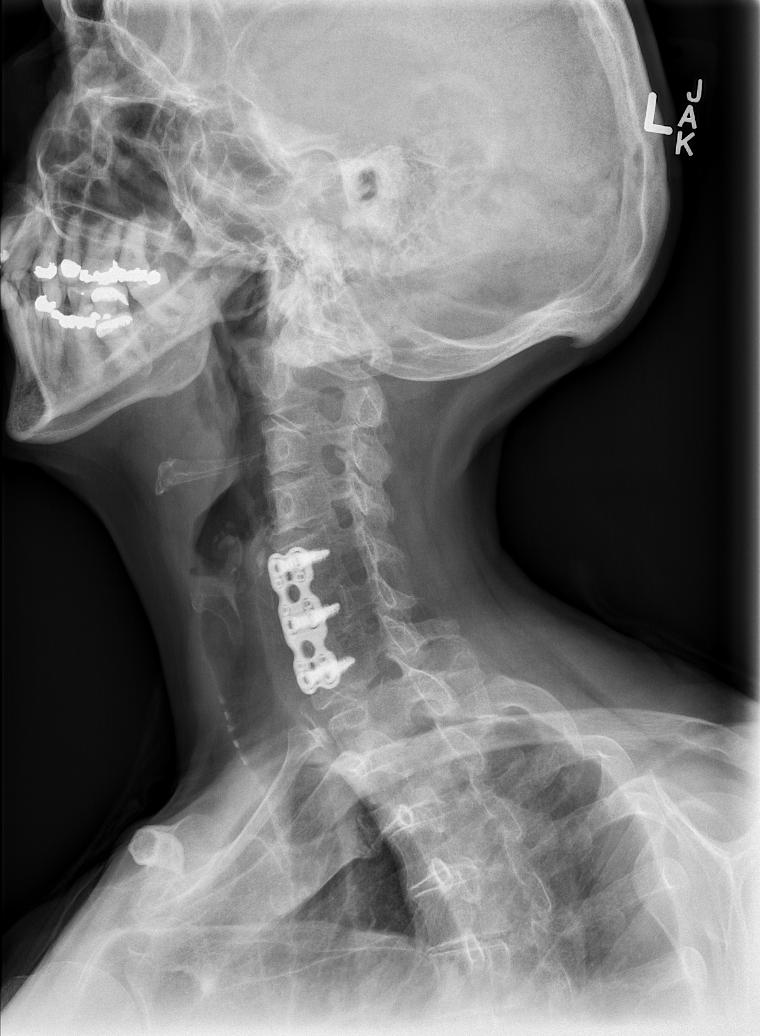

[w c-spine a.p. *]
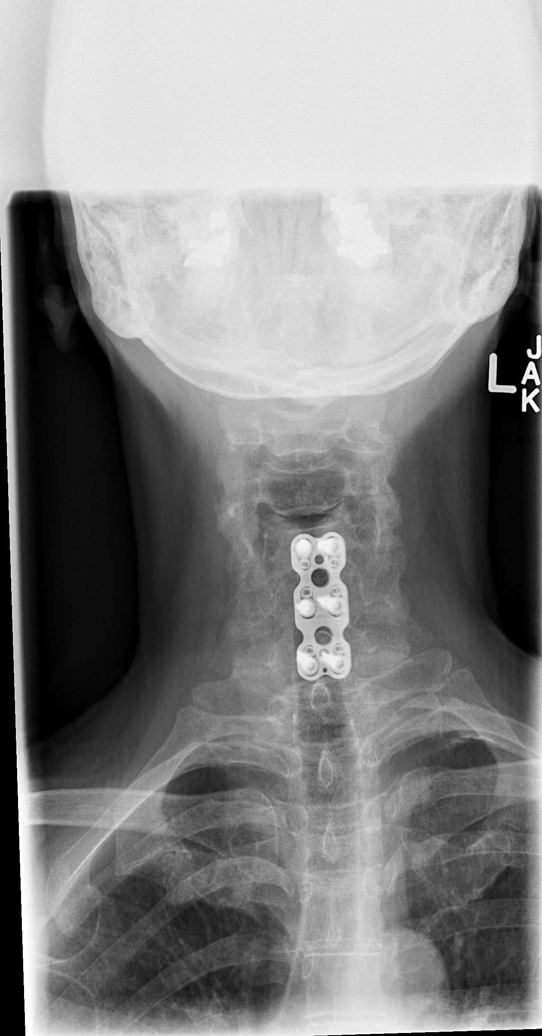

[w c-spine odontoid *]
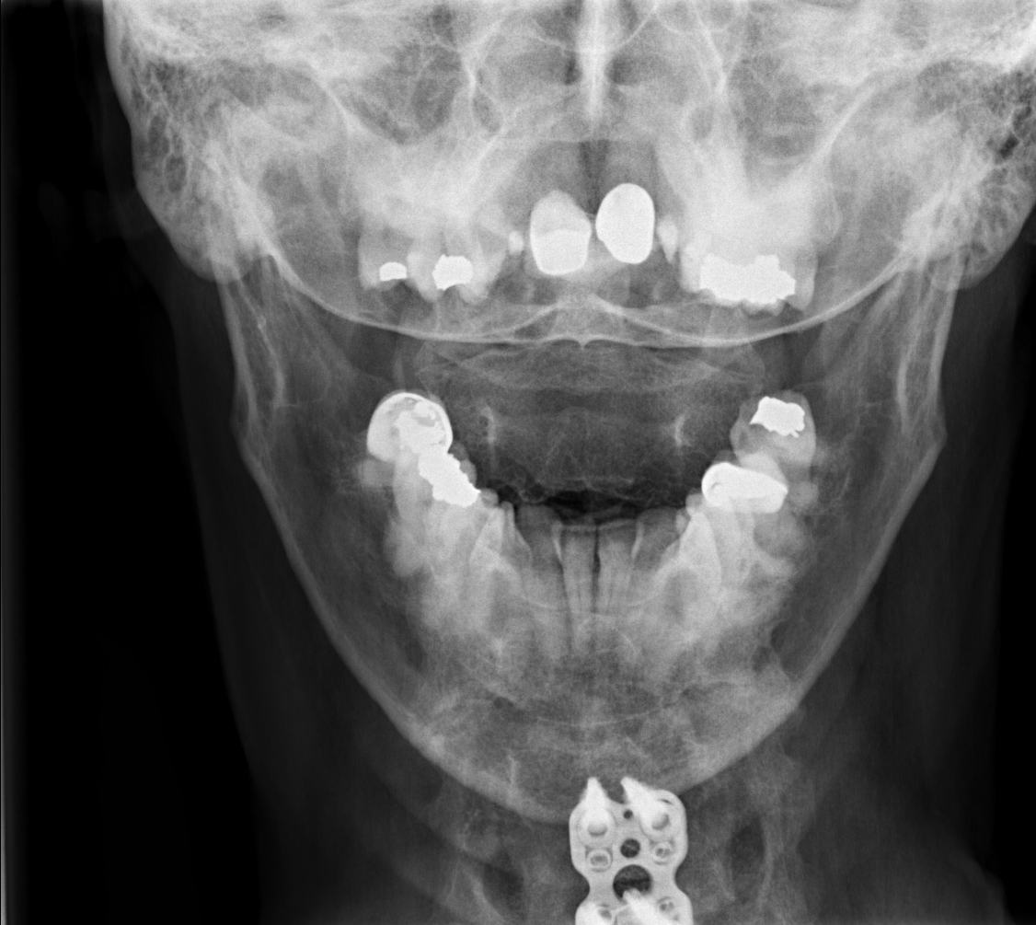

[5 of 5 positions shown; findings below may reference images not displayed]

FINDINGS: Anterior cervical fusion noted at C5-C7. Solid bony fusion across
the disc spaces. No hardware bony complicating feature. Normal
alignment. Disc spaces are maintained. Prevertebral soft tissues are
normal.
IMPRESSION: Prior anterior fusion C5-C7.  No acute findings.

## 2019-02-26 ENCOUNTER — Telehealth: Payer: Self-pay | Admitting: Cardiology

## 2019-02-26 NOTE — Telephone Encounter (Signed)
This message was sent to the wrong pool. Please address this. thanks

## 2019-02-26 NOTE — Telephone Encounter (Signed)
Add on for Stephanie Henson for 02/27/19 at 2:30pm

## 2019-02-27 ENCOUNTER — Other Ambulatory Visit: Payer: Self-pay

## 2019-02-27 ENCOUNTER — Ambulatory Visit: Payer: Medicare Other | Admitting: Physician Assistant

## 2019-02-27 NOTE — Progress Notes (Deleted)
Cardiology Office Note Date:  02/27/2019  Patient ID:  Stephanie, Henson 02-Apr-1945, MRN [id number] PCP:  System, Pcp Not In  Cardiologist:  Dr. Curt Bears  ***refresh   Chief Complaint: *** CP, over due visit  History of Present Illness: Stephanie Henson is a 74 y.o. female with history of DM, HTN, HLD, CKD (III), GERD, pulm nodules, fibromyalgia, goiter.  She comes in today o be seen for Dr. Curt Bears, last seen by him in 2018.  She had previously worn holter monitor in 2017 for c/o fatigue, palpitations, CP that noted SB/SR, bieff AT.  She returned 2018 with CP, palpitations, fatigue, exertional intolerances and EM, stres test done.  Monitor noted 5 beat AT associated with times of palpitations, palpitations also noted with SR, there was also mention of syncope associated with SR, and myoview was negative.  No changes were recommended by Dr. Curt Bears, planned for PRN follow up.  *** symptoms *** brady symptoms? *** meds *** labs, lipids...   Past Medical History:  Diagnosis Date  . Anxiety   . Cervicalgia   . CKD (chronic kidney disease) stage 3, GFR 30-59 ml/min (HCC)   . Coronary artery calcification seen on CAT scan 05/18/2016   Myoview 7/18: EF 67, no ischemia; Low Risk   . Diabetes mellitus ([place])   . Environmental allergies   . FH: colonic polyps   . Fibromyalgia   . GERD (gastroesophageal reflux disease)   . Goiter   . Hematuria   . Hyperlipidemia   . Hypertension   . Osteoporosis   . Solitary pulmonary nodule     ;RUL - 3mm on CT 04/2014; CT 10/2014 resolution of nodule on RUL however persistant smaller nodules throughouts the lungs  . Weight loss     Past Surgical History:  Procedure Laterality Date  . BACK SURGERY    . NASAL SINUS SURGERY    . NECK SURGERY      Current Outpatient Medications  Medication Sig Dispense Refill  . ALPHAGAN P 0.1 % SOLN INSTILL 1 DROP IN BOTH EYES THREE TIMES A DAY  99  . amLODipine (NORVASC) 5 MG tablet Take 1 tablet (5 mg  total) by mouth daily. 30 tablet 0  . amoxicillin (AMOXIL) 500 MG capsule Take 1 capsule (500 mg total) by mouth 3 (three) times daily. 30 capsule 0  . amoxicillin-clavulanate (AUGMENTIN) 875-125 MG tablet Take 1 tablet by mouth 2 (two) times daily.     Marland Kitchen aspirin 81 MG tablet Take 81 mg by mouth daily.    Marland Kitchen BETIMOL 0.5 % ophthalmic solution Place 1 drop into both eyes daily. Each eye once per day    . CAPSAICIN HOT PATCH EX Apply 1 patch topically daily.     . cholecalciferol (VITAMIN D) 1000 units tablet Take 1,000 Units by mouth daily.    . diclofenac sodium (VOLTAREN) 1 % GEL Apply 4 g topically 4 (four) times daily. 3 Tube 3  . fluticasone (FLONASE) 50 MCG/ACT nasal spray Place 1 spray into both nostrils daily. 16 g 6  . latanoprost (XALATAN) 0.005 % ophthalmic solution Place 1 drop into both eyes at bedtime.     Marland Kitchen LORazepam (ATIVAN) 0.5 MG tablet TAKE 1 TABLET BY MOUTH TWICE A DAY 60 tablet 0  . LOTEMAX 0.5 % GEL Place 1 drop into both eyes 4 (four) times daily.  2  . Omega-3 Fatty Acids (FISH OIL) 1000 MG CAPS Take 2 capsules by mouth daily.     Marland Kitchen  omeprazole (PRILOSEC) 20 MG capsule Take 1 capsule (20 mg total) by mouth daily. 30 capsule 3  . ONETOUCH DELICA LANCETS FINE MISC daily. for testing  3  . ONETOUCH VERIO test strip 1 STRIP ONCE A DAY DX E11.9 FINGERSTICK 90  3  . pregabalin (LYRICA) 75 MG capsule Take one at night for a week and then one twice each day. 60 capsule 0  . rosuvastatin (CRESTOR) 10 MG tablet Take 10 mg by mouth every evening.   1  . traMADol (ULTRAM) 50 MG tablet Take 1 tablet (50 mg total) by mouth every 6 (six) hours as needed. for pain 30 tablet 1  . vitamin B-12 (CYANOCOBALAMIN) 1000 MCG tablet Take 1,000 mcg by mouth daily.     No current facility-administered medications for this visit.    Allergies:   Amitriptyline, Asa [aspirin], Ciprofloxacin, Codeine, Lyrica [pregabalin], Sudafed [pseudoephedrine hcl], and Tylenol [acetaminophen]   Social History:   The patient  reports that she has never smoked. She has never used smokeless tobacco. She reports that she does not drink alcohol or use drugs.   Family History:  The patient's family history includes Heart disease in her brother and father; Lung cancer in her sister.  ROS:  Please see the history of present illness.  All other systems are reviewed and otherwise negative.   PHYSICAL EXAM: *** VS:  There were no vitals taken for this visit. BMI: There is no height or weight on file to calculate BMI. Well nourished, well developed, in no acute distress  HEENT: normocephalic, atraumatic  Neck: no JVD, carotid bruits or masses Cardiac:  *** RRR; no significant murmurs, no rubs, or gallops Lungs:  *** CTA b/l, no wheezing, rhonchi or rales  Abd: soft, nontender MS: no deformity or *** atrophy Ext: *** no edema  Skin: warm and dry, no rash Neuro:  No gross deficits appreciated Psych: euthymic mood, full affect    EKG:  Done today and reviewed by myself shows ***   30 day monitor 11/01/16  Review of the above records today demonstrates:  Sinus rhythm with sinus tachycardia and sinus bradycardia seen Symptoms of palpitations, chest pain associated with atrial runs of up to 5 beats but also with sinus rhythm Symptoms of passing out associated with sinus rhythm   Myoview 09/06/16  Nuclear stress EF: 67%. No wall motion abnormalities.  4:41min exercise. Non specific ST segment changes at stress.  This is a low risk study. No ischemia.  The study is normal    Holter 7/17 Average HR: 63 Minimum HR: 46 Maximum HR: 152 0% PVCs Zero atrial runs Sinus rhythm with sinus tachycardia, possibly atrial tachycardia  Chest CT 9/16 FINDINGS: No pericardial effusion. The heart is normal in size. Atherosclerotic calcifications of the coronary vessels are noted. Thoracic aorta is normal in caliber. Visualized thyroid is unremarkable. There is a small hiatal hernia.  Carotid US  6/08 IMPRESSION:  1. No significant carotid stenosis.  2. Mild atherosclerotic plaque.   Recent Labs: No results found for requested labs within last 8760 hours.  No results found for requested labs within last 8760 hours.   CrCl cannot be calculated (Patient's most recent lab result is older than the maximum 21 days allowed.).   Wt Readings from Last 3 Encounters:  02/08/18 111 lb (50.3 kg)  08/09/17 111 lb (50.3 kg)  06/21/17 105 lb (47.6 kg)     Other studies reviewed: Additional studies/records reviewed today include: summarized above  ASSESSMENT AND PLAN:Disposition:  1. HTN     ***  2. HLD     ***   3. *** CP     F/u with ***  Current medicines are reviewed at length with the patient today.  The patient did not have any concerns regarding medicines.***  Signed, Francis Dowse, PA-C 02/27/2019 5:13 AM     [facility] [address] [place] [address] [phone] (office)  [phone] (fax)

## 2019-06-18 ENCOUNTER — Other Ambulatory Visit: Payer: Self-pay | Admitting: Nurse Practitioner

## 2019-06-18 DIAGNOSIS — Z1231 Encounter for screening mammogram for malignant neoplasm of breast: Secondary | ICD-10-CM

## 2019-06-18 DIAGNOSIS — Z780 Asymptomatic menopausal state: Secondary | ICD-10-CM

## 2019-09-06 ENCOUNTER — Ambulatory Visit: Payer: Medicare Other

## 2020-02-16 NOTE — Progress Notes (Signed)
Cardiology Office Note Date:  02/16/2020  Patient ID:  Stephanie Henson, Stephanie Henson [birth year], MRN [id number] PCP:  Courtney Paris, NP  Electrophysiologist: Dr. Elberta Fortis    Chief Complaint: palpitations  History of Present Illness: Stephanie Henson is a 74 y.o. female with history of pre-DM, HTN (she denies this), HLD, GERD, pulm nodules, fibromyalgia  She comes in today to be seen for Dr. Elberta Fortis, last seen by him Sept 2019, at that time 2/2 palpitations, monitored and noted palpitations associated with runs of 5 beats of atrial tachycardia, not felt to require any additional medicines. Mentioned h/o coronary Ca++ on CT  > negative myoview on ASA and statin   TODAY She comes accompanied by her husband.   Says for about a year now she is being woken several times a night with palpitations giving her the urge to urinate, gets up uses the restroom and once back in bed over a few minutes her HR will settle and she is able to go to sleep.  This can happen several times each night No associated SOB, no CP.  She has no daytime palpitations or symptoms.  She is very active, always on her feet doing something, her husband mentions she never stops.  She denies any exertional intolerances, no CP, palpitations during the day or any cardiac awareness. She does have some dizziness, this can be a fleeting lightheaded with standing, some more a an unsteady feeling/off balance, spinning perhaps, her husband says with these he has had to help her walk to sit down. No syncope, no near syncope.  She has seen her PMD and sounds like perhaps Rx meclizine but not covered? She also mentions that she is having some urinary problems, has the urge to urinate but can not and was started on an antibiotic, unknown name.    Past Medical History:  Diagnosis Date  . Anxiety   . Cervicalgia   . CKD (chronic kidney disease) stage 3, GFR 30-59 ml/min (HCC)   . Coronary artery calcification seen on CAT scan 05/18/2016    Myoview 7/18: EF 67, no ischemia; Low Risk   . Diabetes mellitus (HCC)   . Environmental allergies   . FH: colonic polyps   . Fibromyalgia   . GERD (gastroesophageal reflux disease)   . Goiter   . Hematuria   . Hyperlipidemia   . Hypertension   . Osteoporosis   . Solitary pulmonary nodule     ;RUL - 26mm on CT 04/2014; CT 10/2014 resolution of nodule on RUL however persistant smaller nodules throughouts the lungs  . Weight loss     Past Surgical History:  Procedure Laterality Date  . BACK SURGERY    . NASAL SINUS SURGERY    . NECK SURGERY      Current Outpatient Medications  Medication Sig Dispense Refill  . ALPHAGAN P 0.1 % SOLN INSTILL 1 DROP IN BOTH EYES THREE TIMES A DAY  99  . amLODipine (NORVASC) 5 MG tablet Take 1 tablet (5 mg total) by mouth daily. 30 tablet 0  . amoxicillin (AMOXIL) 500 MG capsule Take 1 capsule (500 mg total) by mouth 3 (three) times daily. 30 capsule 0  . amoxicillin-clavulanate (AUGMENTIN) 875-125 MG tablet Take 1 tablet by mouth 2 (two) times daily.     Marland Kitchen aspirin 81 MG tablet Take 81 mg by mouth daily.    Marland Kitchen BETIMOL 0.5 % ophthalmic solution Place 1 drop into both eyes daily. Each eye once per day    .  CAPSAICIN HOT PATCH EX Apply 1 patch topically daily.     . cholecalciferol (VITAMIN D) 1000 units tablet Take 1,000 Units by mouth daily.    . diclofenac sodium (VOLTAREN) 1 % GEL Apply 4 g topically 4 (four) times daily. 3 Tube 3  . fluticasone (FLONASE) 50 MCG/ACT nasal spray Place 1 spray into both nostrils daily. 16 g 6  . latanoprost (XALATAN) 0.005 % ophthalmic solution Place 1 drop into both eyes at bedtime.     Marland Kitchen LORazepam (ATIVAN) 0.5 MG tablet TAKE 1 TABLET BY MOUTH TWICE A DAY 60 tablet 0  . LOTEMAX 0.5 % GEL Place 1 drop into both eyes 4 (four) times daily.  2  . Omega-3 Fatty Acids (FISH OIL) 1000 MG CAPS Take 2 capsules by mouth daily.     Marland Kitchen omeprazole (PRILOSEC) 20 MG capsule Take 1 capsule (20 mg total) by mouth daily. 30 capsule 3  .  ONETOUCH DELICA LANCETS FINE MISC daily. for testing  3  . ONETOUCH VERIO test strip 1 STRIP ONCE A DAY DX E11.9 FINGERSTICK 90  3  . pregabalin (LYRICA) 75 MG capsule Take one at night for a week and then one twice each day. 60 capsule 0  . rosuvastatin (CRESTOR) 10 MG tablet Take 10 mg by mouth every evening.   1  . traMADol (ULTRAM) 50 MG tablet Take 1 tablet (50 mg total) by mouth every 6 (six) hours as needed. for pain 30 tablet 1  . vitamin B-12 (CYANOCOBALAMIN) 1000 MCG tablet Take 1,000 mcg by mouth daily.     No current facility-administered medications for this visit.    Allergies:   Amitriptyline, Asa [aspirin], Ciprofloxacin, Codeine, Lyrica [pregabalin], Sudafed [pseudoephedrine hcl], and Tylenol [acetaminophen]   Social History:  The patient  reports that she has never smoked. She has never used smokeless tobacco. She reports that she does not drink alcohol and does not use drugs.   Family History:  The patient's family history includes Heart disease in her brother and father; Lung cancer in her sister.  ROS:  Please see the history of present illness.    All other systems are reviewed and otherwise negative.   PHYSICAL EXAM:  VS:  There were no vitals taken for this visit. BMI: There is no height or weight on file to calculate BMI. Well nourished, well developed, in no acute distress HEENT: normocephalic, atraumatic Neck: no JVD, carotid bruits or masses Cardiac:  RRR; no significant murmurs, no rubs, or gallops Lungs:  CTA b/l, no wheezing, rhonchi or rales Abd: soft, nontender MS: no deformity, she is quite thin, age appropriate atrophy Ext: no edema Skin: warm and dry, no rash Neuro:  No gross deficits appreciated Psych: euthymic mood, full affect    EKG:  Done today and reviewed by myself shows  SR 71bpm, normal    30 day monitor 11/01/16 :  Sinus rhythm with sinus tachycardia and sinus bradycardia seen Symptoms of palpitations, chest pain associated with  atrial runs of up to 5 beats but also with sinus rhythm Symptoms of passing out associated with sinus rhythm   Myoview 09/06/16  Nuclear stress EF: 67%. No wall motion abnormalities.  4:36min exercise. Non specific ST segment changes at stress.  This is a low risk study. No ischemia.  The study is normal.   Recent Labs: No results found for requested labs within last 8760 hours.  No results found for requested labs within last 8760 hours.   CrCl cannot be  calculated (Patient's most recent lab result is older than the maximum 21 days allowed.).   Wt Readings from Last 3 Encounters:  02/08/18 111 lb (50.3 kg)  08/09/17 111 lb (50.3 kg)  06/21/17 105 lb (47.6 kg)     Other studies reviewed: Additional studies/records reviewed today include: summarized above  ASSESSMENT AND PLAN:  1. HTN (?)     No meds     Some mild orthostatic symptoms     No significant orthostatic changes today, no symptoms with orthostatic vitals today     Urged to keep adequately hydrated, sounds like she does an ok job, also instructed not to avoid sodium   2. HLD     Monitored and managed with her PMD  3. Palpitations     Nocturnal only     Ongoing for a year now   Uncertain abut the accuracy of her med list and recent urinary issues, findings, rx, will rquest her last labs and note from her PMD.  Disposition: will get an echo, 1 week Xio XT and have her back in a month, sooner if needed  Current medicines are reviewed at length with the patient today.  The patient did not have any concerns regarding medicines.  Norma Fredrickson, PA-C 02/16/2020 12:03 PM     [facility] [address] [place] [address] [phone] (office)  [phone] (fax)

## 2020-02-17 ENCOUNTER — Encounter: Payer: Self-pay | Admitting: *Deleted

## 2020-02-17 ENCOUNTER — Encounter: Payer: Self-pay | Admitting: Physician Assistant

## 2020-02-17 ENCOUNTER — Ambulatory Visit (INDEPENDENT_AMBULATORY_CARE_PROVIDER_SITE_OTHER): Payer: Medicare Other | Admitting: Physician Assistant

## 2020-02-17 ENCOUNTER — Other Ambulatory Visit: Payer: Self-pay

## 2020-02-17 ENCOUNTER — Ambulatory Visit (INDEPENDENT_AMBULATORY_CARE_PROVIDER_SITE_OTHER): Payer: Medicare Other

## 2020-02-17 VITALS — BP 110/60 | HR 71 | Ht 64.0 in | Wt 111.8 lb

## 2020-02-17 DIAGNOSIS — E785 Hyperlipidemia, unspecified: Secondary | ICD-10-CM | POA: Diagnosis not present

## 2020-02-17 DIAGNOSIS — R002 Palpitations: Secondary | ICD-10-CM

## 2020-02-17 DIAGNOSIS — R42 Dizziness and giddiness: Secondary | ICD-10-CM

## 2020-02-17 NOTE — Patient Instructions (Addendum)
Medication Instructions:   Your physician recommends that you continue on your current medications as directed. Please refer to the Current Medication list given to you today.  *If you need a refill on your cardiac medications before your next appointment, please call your pharmacy*   Lab Work: NONE ORDERED  TODAY   If you have labs (blood work) drawn today and your tests are completely normal, you will receive your results only by: Marland Kitchen MyChart Message (if you have MyChart) OR . A paper copy in the mail If you have any lab test that is abnormal or we need to change your treatment, we will call you to review the results.   Testing/Procedures: Your physician has requested that you have an echocardiogram. Echocardiography is a painless test that uses sound waves to create images of your heart. It provides your doctor with information about the size and shape of your heart and how well your heart's chambers and valves are working. This procedure takes approximately one hour. There are no restrictions for this procedure.   Your physician has recommended that you wear an event monitor. Event monitors are medical devices that record the heart's electrical activity. Doctors most often [place] these monitors to diagnose arrhythmias. Arrhythmias are problems with the speed or rhythm of the heartbeat. The monitor is a small, portable device. You can wear one while you do your normal daily activities. This is usually used to diagnose what is causing palpitations/syncope (passing out).    Follow-Up: At [facility], you and your health needs are our priority.  As part of our continuing mission to provide you with exceptional heart care, we have created designated Provider Care Teams.  These Care Teams include your primary Cardiologist (physician) and Advanced Practice Providers (APPs -  Physician Assistants and Nurse Practitioners) who all work together to provide you with the care you need, when you need  it.  We recommend signing up for the patient portal called "MyChart".  Sign up information is provided on this After Visit Summary.  MyChart is used to connect with patients for Virtual Visits (Telemedicine).  Patients are able to view lab/test results, encounter notes, upcoming appointments, etc.  Non-urgent messages can be sent to your provider as well.   To learn more about what you can do with MyChart, go to [email or web address].    Your next appointment:   4 week(s)  The format for your next appointment:   In Person  Provider:   Loman Brooklyn, MD   Other Instructions  STAY HYDRATED WITH FLUIDS   ZIO XT- Long Term Monitor Instructions   Your physician has requested you wear your ZIO patch monitor___7____days.   This is a single patch monitor.  [employer] supplies one patch monitor per enrollment.  Additional stickers are not available.   Please do not apply patch if you will be having a Nuclear Stress Test, Echocardiogram, Cardiac CT, MRI, or Chest Xray during the time frame you would be wearing the monitor. The patch cannot be worn during these tests.  You cannot remove and re-apply the ZIO XT patch monitor.   Your ZIO patch monitor will be sent USPS Priority mail from [facility] directly to your home address. The monitor may also be mailed to a PO BOX if home delivery is not available.   It may take 3-5 days to receive your monitor after you have been enrolled.   Once you have received you monitor, please review enclosed instructions.  Your monitor has already been  registered assigning a specific monitor serial # to you.   Applying the monitor   Shave hair from upper left chest.   Hold abrader disc by orange tab.  Rub abrader in 40 strokes over left upper chest as indicated in your monitor instructions.   Clean area with 4 enclosed alcohol pads .  Use all pads to assure are is cleaned thoroughly.  Let dry.   Apply patch as indicated in monitor instructions.   Patch will be place under collarbone on left side of chest with arrow pointing upward.   Rub patch adhesive wings for 2 minutes.Remove white label marked "1".  Remove white label marked "2".  Rub patch adhesive wings for 2 additional minutes.   While looking in a mirror, press and release button in center of patch.  A small green light will flash 3-4 times .  This will be your only indicator the monitor has been turned on.     Do not shower for the first 24 hours.  You may shower after the first 24 hours.   Press button if you feel a symptom. You will hear a small click.  Record Date, Time and Symptom in the Patient Log Book.   When you are ready to remove patch, follow instructions on last 2 pages of Patient Log Book.  Stick patch monitor onto last page of Patient Log Book.   Place Patient Log Book in [place] box.  Use locking tab on box and tape box closed securely.  The [employer] has [employer] on it.  Please place in mailbox as soon as possible.  Your physician should have your test results approximately 7 days after the monitor has been mailed back to [facility]   Call [facility] Customer Care at [phone] if you have questions regarding your ZIO XT patch monitor.  Call them immediately if you see an orange light blinking on your monitor.   If your monitor falls off in less than 4 days contact our Monitor department at [phone].  If your monitor becomes loose or falls off after 4 days call [employer] at [phone] for suggestions on securing your monitor.

## 2020-02-17 NOTE — Progress Notes (Signed)
Patient ID: Stephanie Henson, female   DOB: 11-06-1945, 74 y.o.   MRN: [id number] Patient enrolled for [employer] to ship a 7 day ZIO XT long term holter monitor to her home.

## 2020-02-19 DIAGNOSIS — R002 Palpitations: Secondary | ICD-10-CM | POA: Diagnosis not present

## 2020-03-11 ENCOUNTER — Telehealth (HOSPITAL_COMMUNITY): Payer: Self-pay | Admitting: Physician Assistant

## 2020-03-11 NOTE — Telephone Encounter (Signed)
Patient called and cancelled echocardiogram for reason below:  ROTH, JENNIFER L     Cancel Rsn: Patient (does not want to have test done at this time)   Order will be removed from the WQ and if pt calls back to schedule we will reinstate the order. Thank you.

## 2020-03-17 ENCOUNTER — Other Ambulatory Visit (HOSPITAL_COMMUNITY): Payer: Medicare Other

## 2020-03-26 ENCOUNTER — Ambulatory Visit: Payer: Medicare Other | Admitting: Cardiology

## 2020-11-16 ENCOUNTER — Encounter (HOSPITAL_COMMUNITY): Payer: Self-pay

## 2020-11-16 ENCOUNTER — Other Ambulatory Visit: Payer: Self-pay

## 2020-11-16 ENCOUNTER — Emergency Department (HOSPITAL_COMMUNITY)
Admission: EM | Admit: 2020-11-16 | Discharge: 2020-11-16 | Disposition: A | Discharge status: Home / Self Care | Payer: Medicare Other | Attending: Emergency Medicine | Admitting: Emergency Medicine

## 2020-11-16 DIAGNOSIS — N183 Chronic kidney disease, stage 3 unspecified: Secondary | ICD-10-CM | POA: Diagnosis not present

## 2020-11-16 DIAGNOSIS — M542 Cervicalgia: Secondary | ICD-10-CM | POA: Insufficient documentation

## 2020-11-16 DIAGNOSIS — Z7982 Long term (current) use of aspirin: Secondary | ICD-10-CM | POA: Diagnosis not present

## 2020-11-16 DIAGNOSIS — E1122 Type 2 diabetes mellitus with diabetic chronic kidney disease: Secondary | ICD-10-CM | POA: Diagnosis not present

## 2020-11-16 DIAGNOSIS — I130 Hypertensive heart and chronic kidney disease with heart failure and stage 1 through stage 4 chronic kidney disease, or unspecified chronic kidney disease: Secondary | ICD-10-CM | POA: Diagnosis not present

## 2020-11-16 DIAGNOSIS — Z79899 Other long term (current) drug therapy: Secondary | ICD-10-CM | POA: Diagnosis not present

## 2020-11-16 DIAGNOSIS — M25512 Pain in left shoulder: Secondary | ICD-10-CM | POA: Diagnosis not present

## 2020-11-16 DIAGNOSIS — I5020 Unspecified systolic (congestive) heart failure: Secondary | ICD-10-CM | POA: Insufficient documentation

## 2020-11-16 MED ORDER — MORPHINE SULFATE (PF) 4 MG/ML IV SOLN
4.0000 mg | Freq: Once | INTRAVENOUS | Status: AC
  Administered 2020-11-16: 4 mg via INTRAMUSCULAR
  Filled 2020-11-16: qty 1

## 2020-11-16 MED ORDER — PREDNISONE 20 MG PO TABS
40.0000 mg | ORAL_TABLET | Freq: Once | ORAL | Status: AC
  Administered 2020-11-16: 40 mg via ORAL
  Filled 2020-11-16: qty 2

## 2020-11-16 MED ORDER — METHOCARBAMOL 750 MG PO TABS: 750.0000 mg | ORAL_TABLET | Freq: Three times a day (TID) | ORAL | 0 refills | Status: DC | PRN

## 2020-11-16 MED ORDER — PREDNISONE 20 MG PO TABS: ORAL_TABLET | ORAL | 0 refills | Status: DC

## 2020-11-16 NOTE — Discharge Instructions (Signed)
It was our pleasure to provide your ER care today - we hope that you feel better.  Try heat therapy and/or gentle massage to sore area.   Take prednisone as prescribed. Take acetaminophen or your ultram as need for pain. You may also try robaxin as need for muscle pain/spasm - no driving for the next 6 hours, or when taking robaxin.  Follow up with your primary care doctor and/or orthopedic specialist in the coming week.   Return to ER if worse, new symptoms, fevers, chest pain, trouble breathing, numbness/weakness, or other concern.

## 2020-11-16 NOTE — ED Triage Notes (Signed)
Patient c/o left lateral neck and left shoulder pain x 1 week. Patient denies any injury or heavy lifting. Patient has been using OTC pain patches with no relief.

## 2020-11-16 NOTE — ED Provider Notes (Signed)
[facility]-EMERGENCY DEPT Provider Note   CSN: [id number] Arrival date & time: 11/16/20  3419     History Chief Complaint  Patient presents with   Neck Pain   Shoulder Pain    Takoya CORENE RESNICK is a 75 y.o. female.  Patient with hx ddd, remote cervical fusion, c/o left lateral neck pain that radiates to left arm. Symptoms acute onset ~ 1 week ago, dull ,moderate-severe. Denies recent known injury or strain. No arm numbness or weakness. No loss of normal functional ability. No arm swelling. No skin lesions, rash, redness or swelling to area of pain. Worse w certain movement/positional changes. No fever or chills. No chest pain or sob. Has tried ultram without relief.   The history is provided by the patient, the spouse and medical records.  Neck Pain Associated symptoms: no chest pain, no fever, no headaches, no numbness and no weakness   Shoulder Pain Associated symptoms: neck pain   Associated symptoms: no back pain and no fever       Past Medical History:  Diagnosis Date   Anxiety    Cervicalgia    CKD (chronic kidney disease) stage 3, GFR 30-59 ml/min (HCC)    Coronary artery calcification seen on CAT scan 05/18/2016   Myoview 7/18: EF 67, no ischemia; Low Risk    Diabetes mellitus (HCC)    Environmental allergies    FH: colonic polyps    Fibromyalgia    GERD (gastroesophageal reflux disease)    Goiter    Hematuria    Hyperlipidemia    Hypertension    Osteoporosis    Solitary pulmonary nodule     ;RUL - 63mm on CT 04/2014; CT 10/2014 resolution of nodule on RUL however persistant smaller nodules throughouts the lungs   Weight loss     Patient Active Problem List   Diagnosis Date Noted   History of iron deficiency anemia 06/02/2017   Light headedness 06/02/2017   Neck pain, chronic 05/22/2017   Medication side effect 05/17/2017   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   Chronic midline low back pain without sciatica 04/11/2017   Generalized  abdominal pain 04/11/2017   Acute non-recurrent maxillary sinusitis 04/04/2017   Arthritis 03/30/2017   Anxiety 03/30/2017   Pre-diabetes 08/31/2016   Systolic hypertension 08/31/2016   Coronary artery calcification seen on CAT scan 05/18/2016   Essential hypertension 05/18/2016   Type 2 diabetes mellitus without complication (HCC) 05/18/2016   HLD (hyperlipidemia) 05/18/2016   Plantar fasciitis 03/25/2016   Anemia 10/12/2015   Glaucoma 09/16/2015   Pulmonary nodules 12/26/2012   Chronic rhinitis 12/26/2012    Past Surgical History:  Procedure Laterality Date   BACK SURGERY     NASAL SINUS SURGERY     NECK SURGERY       OB History   No obstetric history on file.     Family History  Problem Relation Age of Onset   Heart disease Father    Heart disease Brother    Lung cancer Sister        smoked    Social History   Tobacco Use   Smoking status: Never   Smokeless tobacco: Never  Vaping Use   Vaping Use: Never used  Substance Use Topics   Alcohol use: No   Drug use: No    Home Medications Prior to Admission medications   Medication Sig Start Date End Date Taking? Authorizing Provider  ALPHAGAN P 0.1 % SOLN INSTILL 1 DROP IN BOTH EYES THREE  TIMES A DAY 12/24/14   [provider]  amLODipine (NORVASC) 5 MG tablet Take 1 tablet (5 mg total) by mouth daily. 08/19/17   Arby Barrette, MD  amoxicillin (AMOXIL) 500 MG capsule Take 1 capsule (500 mg total) by mouth 3 (three) times daily. 08/09/17   Everrett Coombe, DO  amoxicillin-clavulanate (AUGMENTIN) 875-125 MG tablet Take 1 tablet by mouth 2 (two) times daily.  08/19/17   [provider]  aspirin 81 MG tablet Take 81 mg by mouth daily.    [provider]  BETIMOL 0.5 % ophthalmic solution Place 1 drop into both eyes daily. Each eye once per day 11/06/12   [provider]  CAPSAICIN HOT PATCH EX Apply 1 patch topically daily.    [provider]  cholecalciferol (VITAMIN D)  1000 units tablet Take 1,000 Units by mouth daily.    [provider]  diclofenac sodium (VOLTAREN) 1 % GEL Apply 4 g topically 4 (four) times daily. 06/21/17   Kerrin Champagne, MD  famotidine (PEPCID) 20 MG tablet Take 20 mg by mouth 2 (two) times daily. 11/26/19   [provider]  fluticasone (FLONASE) 50 MCG/ACT nasal spray Place 1 spray into both nostrils daily. 04/04/17   Mliss Sax, MD  latanoprost (XALATAN) 0.005 % ophthalmic solution Place 1 drop into both eyes at bedtime.  12/22/12   [provider]  LORazepam (ATIVAN) 0.5 MG tablet TAKE 1 TABLET BY MOUTH TWICE A DAY 07/27/17   Dianne Dun, MD  LOTEMAX 0.5 % GEL Place 1 drop into both eyes 4 (four) times daily. 05/18/17   [provider]  Omega-3 Fatty Acids (FISH OIL) 1000 MG CAPS Take 2 capsules by mouth daily.     [provider]  omeprazole (PRILOSEC) 20 MG capsule Take 1 capsule (20 mg total) by mouth daily. 05/18/17   Doristine Bosworth, MD  [facility] DELICA LANCETS FINE MISC daily. for testing 07/28/17   [provider]  [facility] VERIO test strip 1 STRIP ONCE A DAY DX E11.9 FINGERSTICK 90 08/01/17   [provider]  rosuvastatin (CRESTOR) 10 MG tablet Take 10 mg by mouth every evening.  02/04/16   [provider]  traMADol (ULTRAM) 50 MG tablet Take 1 tablet (50 mg total) by mouth every 6 (six) hours as needed. for pain 08/14/17   Kerrin Champagne, MD  vitamin B-12 (CYANOCOBALAMIN) 1000 MCG tablet Take 1,000 mcg by mouth daily.    [provider]    Allergies    Amitriptyline, Asa [aspirin], Ciprofloxacin, Codeine, Lyrica [pregabalin], Sudafed [pseudoephedrine hcl], and Tylenol [acetaminophen]  Review of Systems   Review of Systems  Constitutional:  Negative for chills and fever.  HENT:  Negative for sore throat.   Eyes:  Negative for redness.  Respiratory:  Negative for cough and shortness of breath.   Cardiovascular:  Negative for chest pain.   Gastrointestinal:  Negative for abdominal pain and vomiting.  Genitourinary:  Negative for flank pain.  Musculoskeletal:  Positive for neck pain. Negative for back pain.  Skin:  Negative for rash.  Neurological:  Negative for weakness, numbness and headaches.  Hematological:  Does not bruise/bleed easily.  Psychiatric/Behavioral:  Negative for confusion.    Physical Exam Updated Vital Signs BP (!) 143/78 (BP Location: Right Arm)   Pulse 89   Temp 98.1 F (36.7 C) (Oral)   Resp 16   Ht 1.626 m (5\' 4" )   Wt 52.2 kg   SpO2 100%  BMI 19.74 kg/m   Physical Exam Vitals and nursing note reviewed.  Constitutional:      Appearance: Normal appearance. She is well-developed.  HENT:     Head: Atraumatic.     Nose: Nose normal.     Mouth/Throat:     Mouth: Mucous membranes are moist.  Eyes:     General: No scleral icterus.    Conjunctiva/sclera: Conjunctivae normal.  Neck:     Trachea: No tracheal deviation.     Comments: Left neck, left trapezius muscular tenderness. No skin lesions, redness, or sts noted. Trachea midline. No mass felt.  Cardiovascular:     Rate and Rhythm: Normal rate and regular rhythm.     Pulses: Normal pulses.     Heart sounds: Normal heart sounds. No murmur heard.   No friction rub. No gallop.  Pulmonary:     Effort: Pulmonary effort is normal. No respiratory distress.     Breath sounds: Normal breath sounds.  Abdominal:     General: There is no distension.  Genitourinary:    Comments: No cva tenderness.  Musculoskeletal:        General: No swelling.     Cervical back: Normal range of motion and neck supple. No rigidity. No muscular tenderness.     Comments: No LUE swelling. Good rom left shoulder without pain. Left neck and left trapezius muscular tenderness ?spasm.  C/T spine non tender, aligned, no step off.   Skin:    General: Skin is warm and dry.     Findings: No rash.  Neurological:     Mental Status: She is alert.     Comments: Alert,  speech normal.   Psychiatric:        Mood and Affect: Mood normal.    ED Results / Procedures / Treatments   Labs (all labs ordered are listed, but only abnormal results are displayed) Labs Reviewed - No data to display  EKG None  Radiology No results found.  Procedures Procedures   Medications Ordered in ED Medications  morphine 4 MG/ML injection 4 mg (has no administration in time range)  predniSONE (DELTASONE) tablet 40 mg (has no administration in time range)    ED Course  I have reviewed the triage vital signs and the nursing notes.  Pertinent labs & imaging results that were available during my care of the patient were reviewed by me and considered in my medical decision making (see chart for details).    MDM Rules/Calculators/A&P                           Pt/spouse request 'pain shot'.  Morphine im. Prednisone po.   Reviewed nursing notes and prior charts for additional history.  Hx ddd, fusion.   No recent trauma or fall. No numbness or weakness. No fever.   Rx for home provided.   Rec pcp and spine specialty f/u.  Return precautions provided.    Final Clinical Impression(s) / ED Diagnoses Final diagnoses:  None    Rx / DC Orders ED Discharge Orders     None        Cathren Laine, MD 11/16/20 1039

## 2021-03-15 ENCOUNTER — Encounter (HOSPITAL_COMMUNITY): Payer: Self-pay

## 2021-03-15 ENCOUNTER — Emergency Department (HOSPITAL_COMMUNITY)
Admission: EM | Admit: 2021-03-15 | Discharge: 2021-03-15 | Disposition: A | Discharge status: Home / Self Care | Payer: Medicare Other | Attending: Emergency Medicine | Admitting: Emergency Medicine

## 2021-03-15 DIAGNOSIS — Z79899 Other long term (current) drug therapy: Secondary | ICD-10-CM | POA: Insufficient documentation

## 2021-03-15 DIAGNOSIS — R82998 Other abnormal findings in urine: Secondary | ICD-10-CM | POA: Insufficient documentation

## 2021-03-15 DIAGNOSIS — E1122 Type 2 diabetes mellitus with diabetic chronic kidney disease: Secondary | ICD-10-CM | POA: Diagnosis not present

## 2021-03-15 DIAGNOSIS — I129 Hypertensive chronic kidney disease with stage 1 through stage 4 chronic kidney disease, or unspecified chronic kidney disease: Secondary | ICD-10-CM | POA: Diagnosis not present

## 2021-03-15 DIAGNOSIS — R350 Frequency of micturition: Secondary | ICD-10-CM | POA: Diagnosis present

## 2021-03-15 DIAGNOSIS — N183 Chronic kidney disease, stage 3 unspecified: Secondary | ICD-10-CM | POA: Diagnosis not present

## 2021-03-15 DIAGNOSIS — I2510 Atherosclerotic heart disease of native coronary artery without angina pectoris: Secondary | ICD-10-CM | POA: Insufficient documentation

## 2021-03-15 LAB — URINALYSIS, ROUTINE W REFLEX MICROSCOPIC
Bilirubin Urine: NEGATIVE
Glucose, UA: NEGATIVE mg/dL
Hgb urine dipstick: NEGATIVE
Ketones, ur: NEGATIVE mg/dL
Nitrite: NEGATIVE
Protein, ur: NEGATIVE mg/dL
Specific Gravity, Urine: 1.018 (ref 1.005–1.030)
pH: 6 (ref 5.0–8.0)

## 2021-03-15 LAB — CBC WITH DIFFERENTIAL/PLATELET
Abs Immature Granulocytes: 0.02 10*3/uL (ref 0.00–0.07)
Basophils Absolute: 0.1 10*3/uL (ref 0.0–0.1)
Basophils Relative: 1 %
Eosinophils Absolute: 0.3 10*3/uL (ref 0.0–0.5)
Eosinophils Relative: 3 %
HCT: 47.1 % — ABNORMAL HIGH (ref 36.0–46.0)
Hemoglobin: 15 g/dL (ref 12.0–15.0)
Immature Granulocytes: 0 %
Lymphocytes Relative: 10 %
Lymphs Abs: 0.9 10*3/uL (ref 0.7–4.0)
MCH: 27.2 pg (ref 26.0–34.0)
MCHC: 31.8 g/dL (ref 30.0–36.0)
MCV: 85.5 fL (ref 80.0–100.0)
Monocytes Absolute: 0.8 10*3/uL (ref 0.1–1.0)
Monocytes Relative: 9 %
Neutro Abs: 7.2 10*3/uL (ref 1.7–7.7)
Neutrophils Relative %: 77 %
Platelets: 274 10*3/uL (ref 150–400)
RBC: 5.51 MIL/uL — ABNORMAL HIGH (ref 3.87–5.11)
RDW: 13.8 % (ref 11.5–15.5)
WBC: 9.2 10*3/uL (ref 4.0–10.5)
nRBC: 0 % (ref 0.0–0.2)

## 2021-03-15 LAB — COMPREHENSIVE METABOLIC PANEL
ALT: 23 U/L (ref 0–44)
AST: 25 U/L (ref 15–41)
Albumin: 4.6 g/dL (ref 3.5–5.0)
Alkaline Phosphatase: 59 U/L (ref 38–126)
Anion gap: 10 (ref 5–15)
BUN: 23 mg/dL (ref 8–23)
CO2: 25 mmol/L (ref 22–32)
Calcium: 9.7 mg/dL (ref 8.9–10.3)
Chloride: 103 mmol/L (ref 98–111)
Creatinine, Ser: <0.3 mg/dL — ABNORMAL LOW (ref 0.44–1.00)
Glucose, Bld: 131 mg/dL — ABNORMAL HIGH (ref 70–99)
Potassium: 3.8 mmol/L (ref 3.5–5.1)
Sodium: 138 mmol/L (ref 135–145)
Total Bilirubin: 0.5 mg/dL (ref 0.3–1.2)
Total Protein: 7.9 g/dL (ref 6.5–8.1)

## 2021-03-15 MED ORDER — CEPHALEXIN 500 MG PO CAPS: 500.0000 mg | ORAL_CAPSULE | Freq: Two times a day (BID) | ORAL | 0 refills | Status: AC

## 2021-03-15 NOTE — ED Provider Notes (Signed)
[facility]-EMERGENCY DEPT Provider Note   CSN: [id number] Arrival date & time: 03/15/21  0855     History  Chief Complaint  Patient presents with   Urinary Urgency    Stephanie Henson is a 76 y.o. female with past medical history of CKD stage III, diabetes mellitus, hypertension, CAD.  Patient presents the emergency department with a chief complaint of urinary frequency.  Patient states over the last 4 days she has been urinating more frequently.  She reports this especially true at night.  Patient states that after going to the bathroom she feels like her bladder is not completely emptied.  Patient was ports that she is feeling like she has to strain to urinate at times.  Patient denies any fever, chills, dysuria, hematuria, vaginal pain, vaginal bleeding, vaginal discharge, new or worsening back pain, numbness, weakness, saddle anesthesia.  Patient denies any recent falls or injuries.  HPI     Home Medications Prior to Admission medications   Medication Sig Start Date End Date Taking? Authorizing Provider  ALPHAGAN P 0.1 % SOLN INSTILL 1 DROP IN BOTH EYES THREE TIMES A DAY 12/24/14   [provider]  amLODipine (NORVASC) 5 MG tablet Take 1 tablet (5 mg total) by mouth daily. 08/19/17   Arby Barrette, MD  amoxicillin (AMOXIL) 500 MG capsule Take 1 capsule (500 mg total) by mouth 3 (three) times daily. 08/09/17   Everrett Coombe, DO  amoxicillin-clavulanate (AUGMENTIN) 875-125 MG tablet Take 1 tablet by mouth 2 (two) times daily.  08/19/17   [provider]  aspirin 81 MG tablet Take 81 mg by mouth daily.    [provider]  BETIMOL 0.5 % ophthalmic solution Place 1 drop into both eyes daily. Each eye once per day 11/06/12   [provider]  CAPSAICIN HOT PATCH EX Apply 1 patch topically daily.    [provider]  cholecalciferol (VITAMIN D) 1000 units tablet Take 1,000 Units by mouth daily.    [provider]   diclofenac sodium (VOLTAREN) 1 % GEL Apply 4 g topically 4 (four) times daily. 06/21/17   Kerrin Champagne, MD  famotidine (PEPCID) 20 MG tablet Take 20 mg by mouth 2 (two) times daily. 11/26/19   [provider]  fluticasone (FLONASE) 50 MCG/ACT nasal spray Place 1 spray into both nostrils daily. 04/04/17   Mliss Sax, MD  latanoprost (XALATAN) 0.005 % ophthalmic solution Place 1 drop into both eyes at bedtime.  12/22/12   [provider]  LORazepam (ATIVAN) 0.5 MG tablet TAKE 1 TABLET BY MOUTH TWICE A DAY 07/27/17   Dianne Dun, MD  LOTEMAX 0.5 % GEL Place 1 drop into both eyes 4 (four) times daily. 05/18/17   [provider]  methocarbamol (ROBAXIN) 750 MG tablet Take 1 tablet (750 mg total) by mouth 3 (three) times daily as needed (muscle spasm/pain). 11/16/20   Cathren Laine, MD  Omega-3 Fatty Acids (FISH OIL) 1000 MG CAPS Take 2 capsules by mouth daily.     [provider]  omeprazole (PRILOSEC) 20 MG capsule Take 1 capsule (20 mg total) by mouth daily. 05/18/17   Doristine Bosworth, MD  [facility] DELICA LANCETS FINE MISC daily. for testing 07/28/17   [provider]  [facility] VERIO test strip 1 STRIP ONCE A DAY DX E11.9 FINGERSTICK 90 08/01/17   [provider]  predniSONE (DELTASONE) 20 MG tablet Take 2 tablets po once a day for 3 days, then 1  po once a day for 3 days 11/16/20   Lajean Saver, MD  rosuvastatin (CRESTOR) 10 MG tablet Take 10 mg by mouth every evening.  02/04/16   [provider]  traMADol (ULTRAM) 50 MG tablet Take 1 tablet (50 mg total) by mouth every 6 (six) hours as needed. for pain 08/14/17   Jessy Oto, MD  vitamin B-12 (CYANOCOBALAMIN) 1000 MCG tablet Take 1,000 mcg by mouth daily.    [provider]      Allergies    Amitriptyline, Asa [aspirin], Ciprofloxacin, Codeine, Lyrica [pregabalin], Sudafed [pseudoephedrine hcl], and Tylenol [acetaminophen]    Review of Systems   Review of Systems   Constitutional:  Negative for chills and fever.  Genitourinary:  Positive for difficulty urinating, frequency and urgency. Negative for dysuria, flank pain, genital sores, hematuria, pelvic pain, vaginal bleeding, vaginal discharge and vaginal pain.  Musculoskeletal:  Positive for back pain (Chronic back pain due to arthritis, unchanged). Negative for neck pain.  Skin:  Negative for rash.  Neurological:  Negative for weakness and numbness.   Physical Exam Updated Vital Signs BP 130/63 (BP Location: Right Arm)    Pulse 64    Temp 97.8 F (36.6 C) (Oral)    Resp 16    Ht 5\' 4"  (1.626 m)    Wt 47.6 kg    SpO2 98%    BMI 18.02 kg/m  Physical Exam Vitals and nursing note reviewed.  Constitutional:      General: She is not in acute distress.    Appearance: She is not ill-appearing, toxic-appearing or diaphoretic.  HENT:     Head: Normocephalic.  Eyes:     General: No scleral icterus.       Right eye: No discharge.        Left eye: No discharge.  Cardiovascular:     Rate and Rhythm: Normal rate.  Pulmonary:     Effort: Pulmonary effort is normal.  Abdominal:     General: Abdomen is flat. Bowel sounds are normal. There is no distension. There are no signs of injury.     Palpations: Abdomen is soft. There is no mass or pulsatile mass.     Tenderness: There is no abdominal tenderness. There is no right CVA tenderness, left CVA tenderness, guarding or rebound.     Hernia: There is no hernia in the umbilical area or ventral area.  Skin:    General: Skin is warm and dry.  Neurological:     General: No focal deficit present.     Mental Status: She is alert.  Psychiatric:        Behavior: Behavior is cooperative.    ED Results / Procedures / Treatments   Labs (all labs ordered are listed, but only abnormal results are displayed) Labs Reviewed  URINALYSIS, ROUTINE W REFLEX MICROSCOPIC - Abnormal; Notable for the following components:      Result Value   APPearance HAZY (*)     Leukocytes,Ua LARGE (*)    Bacteria, UA RARE (*)    All other components within normal limits  COMPREHENSIVE METABOLIC PANEL - Abnormal; Notable for the following components:   Glucose, Bld 131 (*)    Creatinine, Ser <0.30 (*)    All other components within normal limits  CBC WITH DIFFERENTIAL/PLATELET - Abnormal; Notable for the following components:   RBC 5.51 (*)    HCT 47.1 (*)    All other components within normal limits  URINE CULTURE    EKG None  Radiology No results found.  Procedures Procedures    Medications Ordered in ED Medications - No data to display  ED Course/ Medical Decision Making/ A&P                           Medical Decision Making Amount and/or Complexity of Data Reviewed Labs: ordered.  Risk Prescription drug management.   Alert 76 year old female no acute stress, nontoxic-appearing.  Presents emergency department with a chief complaint of urinary frequency, urinary urgency, burning to urinate, and feeling of incomplete bladder emptying.  Symptoms have been present over the last 4 days.  Information was obtained from patient and patient's husband at bedside.  Medical history was reviewed including previous provider notes and lab results.  I personally ordered and reviewed lab work.  Pertinent findings noted below: -CMP and CBC unremarkable -UA shows bacteria rare, leukocytes large, nitrite negative, WBC 0-5, RBC 0-5 -Urine culture pending  I performed bladder scan at bedside which showed 2 mL urine in patient's bladder.  The patient symptoms will treat with Keflex for urinary tract infection.  Patient vies to follow-up with urology due to possible incontinence.    Discussed results, findings, treatment and follow up. Patient advised of return precautions. Patient verbalized understanding and agreed with plan.  Patient was discussed with and evaluated by Dr. Maryan Rued.         Final Clinical Impression(s) / ED Diagnoses Final  diagnoses:  Urinary frequency    Rx / DC Orders ED Discharge Orders          Ordered    cephALEXin (KEFLEX) 500 MG capsule  2 times daily        03/15/21 [address] [place] 03/15/21 1842    Blanchie Dessert, MD 03/18/21 1058

## 2021-03-15 NOTE — ED Triage Notes (Signed)
Pt arrived via POV, c/o urinary urgency, feels she is not completely emptying bladder. Denies any pain with urination.

## 2021-03-15 NOTE — ED Notes (Signed)
This nurse and ED tech, explained to pt's husband, multiple times, PA-C had been notified regarding pt's husband's request to leave. This nurse notified Parke Poisson, PA-C that pt was requesting to leave. This nurse entered the pt's exam room to d/c pt and remove her IV when pt's husband began yelling at this nurse, "Take that thing out of her arm or I'll do it myself!" Pt's husband rose from exam room chair aggressively and approached this nurse continuing to yell, "we're leaving!" This nurse calmly had pt sit on ED stretch so this nurse could remove pt's IV and provide pt with d/c papers. Pt expreessed understanding of d/c instructions and this nurse exited the exam room. Charge Nurse, Patty, aware as well as security.

## 2021-03-15 NOTE — Discharge Instructions (Addendum)
You came to the emergency department today to be evaluated for your urinary symptoms.  Your physical exam and lab work were reassuring.  Due to your urinary symptoms you were started on antibiotics for possible urinary tract infection.  Please follow-up with [facility].  You may have diarrhea from the antibiotics.  It is very important that you continue to take the antibiotics even if you get diarrhea unless a medical professional tells you that you may stop taking them.  If you stop too early the bacteria you are being treated for will become stronger and you may need different, more powerful antibiotics that have more side effects and worsening diarrhea.  Please stay well hydrated and consider probiotics as they may decrease the severity of your diarrhea.   Get help right away if: You are unable to urinate.

## 2021-03-15 NOTE — ED Notes (Signed)
Bladder scan performed by PA-C, 62mL urine in bladder per bladder scan.

## 2021-03-15 NOTE — ED Notes (Signed)
PA-C at the bedside.  ?

## 2021-03-17 LAB — URINE CULTURE: Culture: 80000 — AB

## 2021-03-18 ENCOUNTER — Telehealth: Payer: Self-pay

## 2021-03-18 NOTE — Progress Notes (Signed)
ED Antimicrobial Stewardship Positive Culture Follow Up   Stephanie Henson is an 76 y.o. female who presented to [facility] on 03/15/2021 with a chief complaint of  Chief Complaint  Patient presents with   Urinary Urgency    Recent Results (from the past 720 hour(s))  Urine Culture     Status: Abnormal   Collection Time: 03/15/21  9:46 AM   Specimen: Urine, Clean Catch  Result Value Ref Range Status   Specimen Description   Final    URINE, CLEAN CATCH Performed at [facility], [address] [place], [place] [address]    Special Requests   Final    NONE Performed at [facility], [address] [place], [place] [address]    Culture 80,000 COLONIES/mL PSEUDOMONAS AERUGINOSA (A)  Final   Report Status 03/17/2021 FINAL  Final   Organism ID, Bacteria PSEUDOMONAS AERUGINOSA (A)  Final      Susceptibility   Pseudomonas aeruginosa - MIC*    CEFTAZIDIME <=1 SENSITIVE Sensitive     CIPROFLOXACIN <=0.25 SENSITIVE Sensitive     GENTAMICIN <=1 SENSITIVE Sensitive     IMIPENEM <=0.25 SENSITIVE Sensitive     PIP/TAZO <=4 SENSITIVE Sensitive     CEFEPIME <=0.12 SENSITIVE Sensitive     * 80,000 COLONIES/mL PSEUDOMONAS AERUGINOSA    [x]  Treated with cephalexin, organism not covered by prescribed antimicrobial  Patient has allergy/ADE to Cipro of "hearing loss" which is uncommon adverse effect of Cipro. Noted that fosfomycin unreliable given possibility of intrinsic resistance due to fosA gene but willing to attempt as an oral outpatient option. The patient should follow-up with her PCP if symptoms persist and should be strongly encouraged to follow-up with urology to address for her urinary issues.  New antibiotic prescription: Fosfomycin 3g po x 1. Needs to follow-up with PCP/urology if symptoms persist and reinforce recommendation to follow-up with urology regardless for urinary issues.   ED Provider: , [facility]  Thank you for allowing  pharmacy to be a part of this patients care.  [facility], PharmD, BCPS 03/18/2021 10:36 AM

## 2021-03-18 NOTE — Telephone Encounter (Signed)
Post ED Visit - Positive Culture Follow-up: Successful Patient Follow-Up  Culture assessed and recommendations reviewed by:  []  , Pharm.D. []  Enzo Bi, Pharm.D., BCPS AQ-ID []  , Pharm.D., BCPS [x]  Celedonio Miyamoto, .D., BCPS []  Kuna, .D., BCPS, AAHIVP []  Georgina Pillion, Pharm.D., BCPS, AAHIVP []  [address], PharmD, BCPS []  , PharmD, BCPS []  [place], PharmD, BCPS []  [address], PharmD  Positive urine culture  []  Patient discharged without antimicrobial prescription and treatment is now indicated [x]  Organism is resistant to prescribed ED discharge antimicrobial []  Patient with positive blood cultures  Changes discussed with ED provider: , MD New antibiotic prescription Fosfomycin 3g po x 1 dose Called to [facility] on [address] [phone]  Contacted patient, date 03/18/2021, time 1135 am   03/18/2021, 11:45 AM

## 2021-07-15 ENCOUNTER — Emergency Department (HOSPITAL_COMMUNITY): Payer: Medicare Other

## 2021-07-15 ENCOUNTER — Encounter (HOSPITAL_COMMUNITY): Payer: Self-pay | Admitting: Emergency Medicine

## 2021-07-15 ENCOUNTER — Emergency Department (HOSPITAL_COMMUNITY)
Admission: EM | Admit: 2021-07-15 | Discharge: 2021-07-16 | Disposition: A | Discharge status: Home / Self Care | Payer: Medicare Other | Attending: Emergency Medicine | Admitting: Emergency Medicine

## 2021-07-15 DIAGNOSIS — Z79899 Other long term (current) drug therapy: Secondary | ICD-10-CM | POA: Diagnosis not present

## 2021-07-15 DIAGNOSIS — I2510 Atherosclerotic heart disease of native coronary artery without angina pectoris: Secondary | ICD-10-CM | POA: Insufficient documentation

## 2021-07-15 DIAGNOSIS — I129 Hypertensive chronic kidney disease with stage 1 through stage 4 chronic kidney disease, or unspecified chronic kidney disease: Secondary | ICD-10-CM | POA: Insufficient documentation

## 2021-07-15 DIAGNOSIS — N183 Chronic kidney disease, stage 3 unspecified: Secondary | ICD-10-CM | POA: Insufficient documentation

## 2021-07-15 DIAGNOSIS — R531 Weakness: Secondary | ICD-10-CM | POA: Insufficient documentation

## 2021-07-15 DIAGNOSIS — M5412 Radiculopathy, cervical region: Secondary | ICD-10-CM | POA: Insufficient documentation

## 2021-07-15 DIAGNOSIS — M79622 Pain in left upper arm: Secondary | ICD-10-CM | POA: Insufficient documentation

## 2021-07-15 DIAGNOSIS — Z7982 Long term (current) use of aspirin: Secondary | ICD-10-CM | POA: Insufficient documentation

## 2021-07-15 DIAGNOSIS — M79602 Pain in left arm: Secondary | ICD-10-CM

## 2021-07-15 DIAGNOSIS — M79642 Pain in left hand: Secondary | ICD-10-CM | POA: Diagnosis present

## 2021-07-15 MED ORDER — OXYCODONE HCL 5 MG PO TABS
5.0000 mg | ORAL_TABLET | Freq: Once | ORAL | Status: AC
  Administered 2021-07-16: 5 mg via ORAL
  Filled 2021-07-15: qty 1

## 2021-07-15 NOTE — ED Provider Notes (Signed)
[facility] DEPT Provider Note   CSN: [id number] Arrival date & time: 07/15/21  2215     History  Chief Complaint  Patient presents with   Hand Pain    Stephanie Henson is a 76 y.o. female. With past medical history of CAD, CKD III, fibromyalgia, GERD, osteoporosis, HTN who presents to the emergency department with left hand pain.   States this evening around 6 PM she was sitting down watching TV when she began to have what she describes as "hard" pain in her left hand just proximal to her thumb.  She states that since then the pain has progressed to going up her arm into her shoulder.  It is constant.  She denies any falls or injuries.  She denies any recent fevers.  Denies any swelling of her joints.  No previous surgery on her hand.  She denies any chest pain or shortness of breath or lightheadedness.  She states that she has arthritis and has chronic pain issues in her neck.   Hand Pain Pertinent negatives include no chest pain and no shortness of breath.      Home Medications Prior to Admission medications   Medication Sig Start Date End Date Taking? Authorizing Provider  ALPHAGAN P 0.1 % SOLN INSTILL 1 DROP IN BOTH EYES THREE TIMES A DAY 12/24/14   [provider]  amLODipine (NORVASC) 5 MG tablet Take 1 tablet (5 mg total) by mouth daily. 08/19/17   Charlesetta Shanks, MD  aspirin 81 MG tablet Take 81 mg by mouth daily.    [provider]  BETIMOL 0.5 % ophthalmic solution Place 1 drop into both eyes daily. Each eye once per day 11/06/12   [provider]  [place] 1 patch topically daily.    [provider]  cholecalciferol (VITAMIN D) 1000 units tablet Take 1,000 Units by mouth daily.    [provider]  diclofenac sodium (VOLTAREN) 1 % GEL Apply 4 g topically 4 (four) times daily. 06/21/17   Jessy Oto, MD  famotidine (PEPCID) 20 MG tablet Take 20 mg by mouth 2 (two) times daily.  11/26/19   [provider]  fluticasone (FLONASE) 50 MCG/ACT nasal spray Place 1 spray into both nostrils daily. 04/04/17   Libby Maw, MD  latanoprost (XALATAN) 0.005 % ophthalmic solution Place 1 drop into both eyes at bedtime.  12/22/12   [provider]  LORazepam (ATIVAN) 0.5 MG tablet TAKE 1 TABLET BY MOUTH TWICE A DAY 07/27/17   Lucille Passy, MD  LOTEMAX 0.5 % GEL Place 1 drop into both eyes 4 (four) times daily. 05/18/17   [provider]  methocarbamol (ROBAXIN) 750 MG tablet Take 1 tablet (750 mg total) by mouth 3 (three) times daily as needed (muscle spasm/pain). 11/16/20   Lajean Saver, MD  Omega-3 Fatty Acids (FISH OIL) 1000 MG CAPS Take 2 capsules by mouth daily.     [provider]  omeprazole (PRILOSEC) 20 MG capsule Take 1 capsule (20 mg total) by mouth daily. 05/18/17   Forrest Moron, MD  [facility] DELICA LANCETS FINE MISC daily. for testing 07/28/17   [provider]  [facility] VERIO test strip 1 STRIP ONCE A DAY DX E11.9 FINGERSTICK 90 08/01/17   [provider]  predniSONE (DELTASONE) 20 MG tablet Take 2 tablets po once a day for 3 days, then 1 po once a day for 3 days 11/16/20   Lajean Saver, MD  rosuvastatin (CRESTOR) 10 MG tablet Take 10 mg by mouth every evening.  02/04/16   [provider]  traMADol (ULTRAM) 50 MG tablet Take 1 tablet (50 mg total) by mouth every 6 (six) hours as needed. for pain 08/14/17   Jessy Oto, MD  vitamin B-12 (CYANOCOBALAMIN) 1000 MCG tablet Take 1,000 mcg by mouth daily.    [provider]      Allergies    Amitriptyline, Asa [aspirin], Ciprofloxacin, Codeine, Lyrica [pregabalin], Sudafed [pseudoephedrine hcl], and Tylenol [acetaminophen]    Review of Systems   Review of Systems  Constitutional:  Negative for fever.  Respiratory:  Negative for shortness of breath.   Cardiovascular:  Negative for chest pain.  Musculoskeletal:  Positive for arthralgias, myalgias  and neck pain. Negative for joint swelling.  All other systems reviewed and are negative.  Physical Exam Updated Vital Signs BP 140/82 (BP Location: Right Arm)   Pulse 92   Temp 97.7 F (36.5 C) (Oral)   Resp 18   SpO2 96%  Physical Exam Vitals and nursing note reviewed.  Constitutional:      General: She is not in acute distress.    Appearance: Normal appearance. She is normal weight. She is not ill-appearing or toxic-appearing.  HENT:     Head: Normocephalic and atraumatic.  Eyes:     General: No scleral icterus.    Extraocular Movements: Extraocular movements intact.  Cardiovascular:     Rate and Rhythm: Normal rate and regular rhythm.     Pulses: Normal pulses.  Pulmonary:     Effort: Pulmonary effort is normal. No respiratory distress.  Musculoskeletal:        General: Tenderness present.     Left shoulder: Tenderness and bony tenderness present.     Left hand: Tenderness present. No swelling or deformity. Decreased range of motion. Normal pulse.     Cervical back: Neck supple. Tenderness and bony tenderness present. Normal range of motion.  Skin:    General: Skin is warm and dry.     Capillary Refill: Capillary refill takes less than 2 seconds.     Findings: No bruising or erythema.  Neurological:     General: No focal deficit present.     Mental Status: She is alert and oriented to person, place, and time. Mental status is at baseline.     Cranial Nerves: No cranial nerve deficit.     Sensory: No sensory deficit.     Motor: Weakness present.  Psychiatric:        Mood and Affect: Mood normal.        Behavior: Behavior normal.        Thought Content: Thought content normal.        Judgment: Judgment normal.    ED Results / Procedures / Treatments   Labs (all labs ordered are listed, but only abnormal results are displayed) Labs Reviewed  COMPREHENSIVE METABOLIC PANEL - Abnormal; Notable for the following components:      Result Value   Glucose, Bld 124 (*)     All other components within normal limits  CBC  SEDIMENTATION RATE  C-REACTIVE PROTEIN  TROPONIN I (HIGH SENSITIVITY)  TROPONIN I (HIGH SENSITIVITY)   EKG EKG Interpretation  Date/Time:  Thursday Jul 15 2021 23:53:20 EDT Ventricular Rate:  63 PR Interval:  147 QRS Duration: 89 QT Interval:  419 QTC Calculation: 429 R Axis:   0 Text Interpretation: Sinus rhythm Normal ECG No significant change was found Confirmed by Molpus,  John [phone]) on 07/16/2021 12:03:08 AM  Radiology CT Cervical Spine Wo Contrast  Result Date: 07/15/2021 CLINICAL DATA:  Cervical radiculopathy, no red flags. Left arm pain. EXAM: CT CERVICAL SPINE WITHOUT CONTRAST TECHNIQUE: Multidetector CT imaging of the cervical spine was performed without intravenous contrast. Multiplanar CT image reconstructions were also generated. RADIATION DOSE REDUCTION: This exam was performed according to the departmental dose-optimization program which includes automated exposure control, adjustment of the mA and/or kV according to patient size and/or use of iterative reconstruction technique. COMPARISON:  05/26/2017. FINDINGS: Alignment: Normal. Skull base and vertebrae: No acute fracture. Anterior spinal fusion hardware is noted from C5-C7 without evidence of hardware loosening. Soft tissues and spinal canal: No prevertebral fluid or swelling. No visible canal hematoma. Disc levels: C2-C3: Minimal disc bulge without significant stenosis. Mild facet arthropathy is noted on the left. C3-C4: Minimal disc bulge with uncovertebral osteophyte formation and facet arthropathy bilaterally, greater on the left than on the right. The spinal canal and right neural foramina are within normal limits. There is mild neural foraminal stenosis on the left. C4-C5: Minimal disc bulge with uncovertebral osteophyte formation and facet arthropathy. No significant spinal canal or neural foraminal stenosis. C5-C6: Status post discectomy and fusion. Mild facet  arthropathy on the left. No significant stenosis. C6-C7: Status post discectomy and fusion.  No significant stenosis. Upper chest: No acute abnormality. Other: Mild diffuse enlargement of the thyroid gland. Carotid artery calcifications. IMPRESSION: 1. Mild degenerative changes in the cervical spine. 2. Anterior cervical spinal fusion from C5-C7 with no evidence of hardware loosening. Electronically Signed   By: Brett Fairy M.D.   On: 07/15/2021 23:53   DG Hand Complete Left  Result Date: 07/15/2021 CLINICAL DATA:  Sudden onset of hand pain EXAM: LEFT HAND - COMPLETE 3+ VIEW COMPARISON:  None FINDINGS: no fracture or malalignment. Bones appear osteopenic. Joint space narrowing involving the D IP and PIP joints with soft tissue swelling. Mild degenerative changes at the first [facility] joint. Os or old injury at the ulnar styloid. IMPRESSION: 1. No acute osseous abnormality 2. 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   On: 07/15/2021 23:01    Procedures Procedures   Medications Ordered in ED Medications  oxyCODONE (Oxy IR/ROXICODONE) immediate release tablet 5 mg (5 mg Oral Given 07/16/21 0031)  oxyCODONE (Oxy IR/ROXICODONE) immediate release tablet 5 mg (5 mg Oral Given 07/16/21 0349)   ED Course/ Medical Decision Making/ A&P                           Medical Decision Making Amount and/or Complexity of Data Reviewed Radiology: ordered.  Risk Prescription drug management.  This patient presents to the ED for concern of hand pain, this involves an extensive number of treatment options, and is a complaint that carries with it a high risk of complications and morbidity.  The differential diagnosis includes osteoarthritis, septic joint, radiculopathy, fracture or dislocation, flexor tenosynovitis, ACS etc.   Co morbidities that complicate the patient evaluation Fibromyalgia, HTN, osteoporosis   Additional history obtained:  Additional history  obtained from: husband at bedside  External records from outside source obtained and reviewed including: ED physician note from 11/16/20  EKG: EKG: normal EKG, normal sinus rhythm, unchanged from previous tracings.   Lab Results: I personally ordered, reviewed, and interpreted labs. Pertinent results include: CMP: within normal limits CBC: within normal limits  ESR/CRP: within normal limits  Troponin: 4   Imaging Studies ordered:  I ordered imaging studies which included x-ray and CT.  I independently reviewed & interpreted imaging & am in agreement with radiology impression. Imaging shows: XR Hand:  1. No acute osseous abnormality  2. Arthritis with soft tissue swelling of the digits   CT C Spine: no significant stenosis, fracture subluxation   Medications  I ordered medication including oxycodone for pain Reevaluation of the patient after medication shows that patient improved -I reviewed the patient's home medications and did not make adjustments. -I did not prescribe new home medications.  ED Course:  76 year old female who presents to the emergency department with left arm pain. She is diffusely tender on the left arm including the left shoulder and left neck as well as the C-spine.  She has slightly decreased grip strength. There is no redness or swelling concerning for a septic joint.  She has also been afebrile and there is no leukocytosis.  Her ESR and CRP are negative.  I doubt some infectious cause of her symptoms. No trauma or falls, doubt fracture or dislocation.  Symptoms are also inconsistent with gout. Triage obtain imaging of her hand which was negative.  She does have arthritic changes. Obtain C-spine imaging to evaluate for any critical stenosis which there is not. Given her left arm pain obtain EKG which does not show any ischemia or infarction.  Troponin x1 negative.  Doubt ACS. She was given narcotic pain medication here in the emergency department with mild  relief of her symptoms.  Think that this is likely radiculopathy.  Instructed to use warm compresses and massage.  She has chronic pain medication prescribed to her already and instructed that she can continue to use this.  Given return precautions for any worsening symptoms or progression of her symptoms to chest pain or weakness, inability to lift her arm.  She verbalized understanding.  I discussed this case with my attending physician who cosigned this note including patient's presenting symptoms, physical exam, and planned diagnostics and interventions. Attending physician stated agreement with plan or made changes to plan which were implemented.   Attending physician assessed patient at bedside.   After consideration of the diagnostic results and the patients response to treatment, I feel that the patent would benefit from discharge. The patient has been appropriately medically screened and/or stabilized in the ED. I have low suspicion for any other emergent medical condition which would require further screening, evaluation or treatment in the ED or require inpatient management. The patient is overall well appearing and non-toxic in appearance. They are hemodynamically stable at time of discharge.   Final Clinical Impression(s) / ED Diagnoses Final diagnoses:  Pain of left upper extremity    Rx / DC Orders ED Discharge Orders     None         Mickie Hillier, PA-C 07/16/21 0313    Molpus, Jenny Reichmann, MD 07/16/21 0973

## 2021-07-15 NOTE — ED Provider Triage Note (Signed)
Emergency Medicine Provider Triage Evaluation Note  Stephanie Henson , a 76 y.o. female  was evaluated in triage.  Pt complains of L arm pain. She has acure onset of pain in the left arm described as severe. She had pain in the thumb radiating up the arm to the shoulder.  She states that the pain is so sever she can barely move the arm.She also noticed her arm felt warm and her veins became engorged. .  Review of Systems  Positive: Arm pain, neck pain Negative: weakness  Physical Exam  BP 140/82 (BP Location: Right Arm)   Pulse 92   Temp 97.7 F (36.5 C) (Oral)   Resp 18   SpO2 96%  Gen:   Awake, no distress   Resp:  Normal effort  MSK:   Moves extremities without difficulty  Other:  R arm ttp, warm. Venous distension  Medical Decision Making  Medically screening exam initiated at 11:17 PM.  Appropriate orders placed.  Stephanie Henson was informed that the remainder of the evaluation will be completed by another provider, this initial triage assessment does not replace that evaluation, and the importance of remaining in the ED until their evaluation is complete.  Work up initiated.   Margarita Mail, PA-C 07/15/21 2329

## 2021-07-15 NOTE — ED Triage Notes (Signed)
Pt reports lump suddenly came up on L hand and started to hurt. Pain radiated to arm, elbow. Bump is tender on her hand. She was just watching TV so no trauma. Denies other symptoms like SOB or chest pain.

## 2021-07-16 ENCOUNTER — Emergency Department (HOSPITAL_COMMUNITY): Payer: Medicare Other

## 2021-07-16 ENCOUNTER — Encounter (HOSPITAL_COMMUNITY): Payer: Self-pay

## 2021-07-16 ENCOUNTER — Emergency Department (HOSPITAL_COMMUNITY)
Admission: EM | Admit: 2021-07-16 | Discharge: 2021-07-16 | Disposition: A | Discharge status: Home / Self Care | Payer: Medicare Other | Source: Home / Self Care | Attending: Emergency Medicine | Admitting: Emergency Medicine

## 2021-07-16 ENCOUNTER — Other Ambulatory Visit: Payer: Self-pay

## 2021-07-16 DIAGNOSIS — Z7982 Long term (current) use of aspirin: Secondary | ICD-10-CM | POA: Insufficient documentation

## 2021-07-16 DIAGNOSIS — R531 Weakness: Secondary | ICD-10-CM | POA: Insufficient documentation

## 2021-07-16 DIAGNOSIS — M5412 Radiculopathy, cervical region: Secondary | ICD-10-CM | POA: Insufficient documentation

## 2021-07-16 DIAGNOSIS — M79622 Pain in left upper arm: Secondary | ICD-10-CM | POA: Diagnosis not present

## 2021-07-16 LAB — CBC
HCT: 42.8 % (ref 36.0–46.0)
HCT: 44.5 % (ref 36.0–46.0)
Hemoglobin: 13.7 g/dL (ref 12.0–15.0)
Hemoglobin: 14.9 g/dL (ref 12.0–15.0)
MCH: 27.2 pg (ref 26.0–34.0)
MCH: 28.1 pg (ref 26.0–34.0)
MCHC: 32 g/dL (ref 30.0–36.0)
MCHC: 33.5 g/dL (ref 30.0–36.0)
MCV: 83.8 fL (ref 80.0–100.0)
MCV: 84.9 fL (ref 80.0–100.0)
Platelets: 214 10*3/uL (ref 150–400)
Platelets: 231 10*3/uL (ref 150–400)
RBC: 5.04 MIL/uL (ref 3.87–5.11)
RBC: 5.31 MIL/uL — ABNORMAL HIGH (ref 3.87–5.11)
RDW: 13.1 % (ref 11.5–15.5)
RDW: 13.2 % (ref 11.5–15.5)
WBC: 12 10*3/uL — ABNORMAL HIGH (ref 4.0–10.5)
WBC: 9.1 10*3/uL (ref 4.0–10.5)
nRBC: 0 % (ref 0.0–0.2)
nRBC: 0 % (ref 0.0–0.2)

## 2021-07-16 LAB — TROPONIN I (HIGH SENSITIVITY)
Troponin I (High Sensitivity): 4 ng/L (ref <18)
Troponin I (High Sensitivity): 4 ng/L (ref <18)
Troponin I (High Sensitivity): 4 ng/L (ref <18)

## 2021-07-16 LAB — BASIC METABOLIC PANEL
Anion gap: 9 (ref 5–15)
BUN: 17 mg/dL (ref 8–23)
CO2: 23 mmol/L (ref 22–32)
Calcium: 10.2 mg/dL (ref 8.9–10.3)
Chloride: 109 mmol/L (ref 98–111)
Creatinine, Ser: 0.85 mg/dL (ref 0.44–1.00)
GFR, Estimated: >60 mL/min (ref >60)
Glucose, Bld: 213 mg/dL — ABNORMAL HIGH (ref 70–99)
Potassium: 3.7 mmol/L (ref 3.5–5.1)
Sodium: 141 mmol/L (ref 135–145)

## 2021-07-16 LAB — COMPREHENSIVE METABOLIC PANEL
ALT: 15 U/L (ref 0–44)
AST: 19 U/L (ref 15–41)
Albumin: 4.3 g/dL (ref 3.5–5.0)
Alkaline Phosphatase: 104 U/L (ref 38–126)
Anion gap: 7 (ref 5–15)
BUN: 20 mg/dL (ref 8–23)
CO2: 24 mmol/L (ref 22–32)
Calcium: 9.3 mg/dL (ref 8.9–10.3)
Chloride: 108 mmol/L (ref 98–111)
Creatinine, Ser: 0.76 mg/dL (ref 0.44–1.00)
GFR, Estimated: >60 mL/min (ref >60)
Glucose, Bld: 124 mg/dL — ABNORMAL HIGH (ref 70–99)
Potassium: 3.5 mmol/L (ref 3.5–5.1)
Sodium: 139 mmol/L (ref 135–145)
Total Bilirubin: 0.4 mg/dL (ref 0.3–1.2)
Total Protein: 7.1 g/dL (ref 6.5–8.1)

## 2021-07-16 LAB — C-REACTIVE PROTEIN: CRP: 0.6 mg/dL (ref <1.0)

## 2021-07-16 LAB — SEDIMENTATION RATE: Sed Rate: 12 mm/hr (ref 0–22)

## 2021-07-16 MED ORDER — HYDROMORPHONE HCL 2 MG PO TABS: 2.0000 mg | ORAL_TABLET | Freq: Four times a day (QID) | ORAL | 0 refills | Status: DC | PRN

## 2021-07-16 MED ORDER — ONDANSETRON HCL 4 MG/2ML IJ SOLN
4.0000 mg | Freq: Once | INTRAMUSCULAR | Status: AC
  Administered 2021-07-16: 4 mg via INTRAVENOUS
  Filled 2021-07-16: qty 2

## 2021-07-16 MED ORDER — HYDROMORPHONE HCL 1 MG/ML IJ SOLN
1.0000 mg | Freq: Once | INTRAMUSCULAR | Status: AC
  Administered 2021-07-16: 1 mg via INTRAVENOUS
  Filled 2021-07-16: qty 1

## 2021-07-16 MED ORDER — KETOROLAC TROMETHAMINE 30 MG/ML IJ SOLN
30.0000 mg | Freq: Once | INTRAMUSCULAR | Status: AC
  Administered 2021-07-16: 30 mg via INTRAVENOUS
  Filled 2021-07-16: qty 1

## 2021-07-16 MED ORDER — LORAZEPAM 2 MG/ML IJ SOLN
0.5000 mg | Freq: Once | INTRAMUSCULAR | Status: AC
  Administered 2021-07-16: 0.5 mg via INTRAVENOUS
  Filled 2021-07-16: qty 1

## 2021-07-16 MED ORDER — OXYCODONE HCL 5 MG PO TABS
5.0000 mg | ORAL_TABLET | Freq: Once | ORAL | Status: AC
  Administered 2021-07-16: 5 mg via ORAL
  Filled 2021-07-16: qty 1

## 2021-07-16 NOTE — ED Provider Notes (Signed)
[facility] DEPT Provider Note   CSN: [id number] Arrival date & time: 07/16/21  1050     History  Chief Complaint  Patient presents with   Arm Pain   Shoulder Pain   Palpitations    Stephanie Henson is a 76 y.o. female with history of cervical spinal surgery many years ago presenting emerged department with complaint of pain in her left neck and her left hand.  She reports this began spontaneously yesterday while resting on the couch, with pain that was throbbing mostly around her left first and second finger, and also seem to travel up the medial aspect of her left arm towards her shoulder.  It was worse with any type of movement.  Her arm felt heavy.  She has never had this pain before.  She denies any traumas or twisting accidents beforehand.  She came into the ER last night, where she had blood test done, including ESR, CRP, white blood cell count, which are normal.  She had a CT of the cervical spine performed showing anteiror cervical fusion C5-C7 and mild degenerative changes of the cervical spine.  She was discharged home.  She is prescribed tramadol at home which she uses for pain but it is not relieving her pain.  She returns now the company of her husband complaining of continued persistent pain.  Patient also complains that she has had some left lower sided chest pain that also began last night, intermittent, below her left breast.  She cannot quantify or qualify it in any other way to me, despite repeat questioning, but states repeatedly "It's just a pain."  She denies history of MI or known coronary disease or cardiac stents.  Of note, she reports extensive allergies to multiple medications, which includes gabapentin, and steroids, per her report.  HPI     Home Medications Prior to Admission medications   Medication Sig Start Date End Date Taking? Authorizing Provider  HYDROmorphone (DILAUDID) 2 MG tablet Take 1 tablet (2 mg total) by mouth  every 6 (six) hours as needed for up to 10 doses for severe pain. 07/16/21  Yes Raine Blodgett, Carola Rhine, MD  ALPHAGAN P 0.1 % SOLN Place 1 drop into both eyes 3 (three) times daily. 12/24/14   [provider]  amLODipine (NORVASC) 5 MG tablet Take 1 tablet (5 mg total) by mouth daily. 08/19/17   Charlesetta Shanks, MD  aspirin 81 MG tablet Take 81 mg by mouth daily.    [provider]  CAPSAICIN HOT PATCH EX Apply 1 patch topically daily.    [provider]  carboxymethylcellulose (REFRESH PLUS) 0.5 % SOLN 1 drop daily as needed (for dry eye).    [provider]  cetirizine (ZYRTEC) 10 MG tablet Take 10 mg by mouth daily. 02/26/21   [provider]  cholecalciferol (VITAMIN D) 1000 units tablet Take 1,000 Units by mouth daily.    [provider]  latanoprost (XALATAN) 0.005 % ophthalmic solution Place 1 drop into both eyes at bedtime.  12/22/12   [provider]  Lifitegrast Shirley Friar) 5 % SOLN Place 1 drop into both eyes 2 (two) times daily.    [provider]  LORazepam (ATIVAN) 0.5 MG tablet TAKE 1 TABLET BY MOUTH TWICE A DAY Patient taking differently: Take 0.5 mg by mouth 2 (two) times daily. 07/27/17   Lucille Passy, MD  Omega-3 Fatty Acids (FISH OIL) 1000 MG CAPS Take 2 capsules by mouth daily.  [provider]  omeprazole (PRILOSEC) 20 MG capsule Take 1 capsule (20 mg total) by mouth daily. 05/18/17   Forrest Moron, MD  [facility] DELICA LANCETS FINE MISC daily. for testing 07/28/17   [provider]  [facility] VERIO test strip 1 STRIP ONCE A DAY DX E11.9 FINGERSTICK 90 08/01/17   [provider]  rosuvastatin (CRESTOR) 10 MG tablet Take 10 mg by mouth every evening.  02/04/16   [provider]  trolamine salicylate (ASPERCREME) 10 % cream Apply 1 application. topically 2 (two) times daily.    [provider]      Allergies    Amitriptyline, Asa [aspirin], Ciprofloxacin, Codeine, Lyrica  [pregabalin], Sudafed [pseudoephedrine hcl], and Tylenol [acetaminophen]    Review of Systems   Review of Systems  Physical Exam Updated Vital Signs BP 125/69   Pulse 78   Temp 97.8 F (36.6 C) (Oral)   Resp (!) 24   Ht _0  (1.626 m)   SpO2 98%   BMI 17.85 kg/m  Physical Exam Constitutional:      General: She is not in acute distress. HENT:     Head: Normocephalic and atraumatic.  Eyes:     Conjunctiva/sclera: Conjunctivae normal.     Pupils: Pupils are equal, round, and reactive to light.  Cardiovascular:     Rate and Rhythm: Normal rate and regular rhythm.  Pulmonary:     Effort: Pulmonary effort is normal. No respiratory distress.  Musculoskeletal:     Comments: Hyperalgesia of the dorsum of the left hand, wrist, forearm No warmth or effusion palpable of the left wrist or left elbow, no erythema streaking up the arm focal trapezius pinpoint tenderness of the left shoulder   Skin:    General: Skin is warm and dry.  Neurological:     General: No focal deficit present.     Mental Status: She is alert. Mental status is at baseline.     Comments: Patient is weakness 3 out of 5 strength in both wrist flexion and extension, finger abduction and abduction and thumb opposition, poor effort due to pain   Psychiatric:        Mood and Affect: Mood normal.        Behavior: Behavior normal.    ED Results / Procedures / Treatments   Labs (all labs ordered are listed, but only abnormal results are displayed) Labs Reviewed  BASIC METABOLIC PANEL - Abnormal; Notable for the following components:      Result Value   Glucose, Bld 213 (*)    All other components within normal limits  CBC - Abnormal; Notable for the following components:   WBC 12.0 (*)    RBC 5.31 (*)    All other components within normal limits  TROPONIN I (HIGH SENSITIVITY)    EKG None  Radiology CT Cervical Spine Wo Contrast  Result Date: 07/15/2021 CLINICAL DATA:  Cervical radiculopathy, no red  flags. Left arm pain. EXAM: CT CERVICAL SPINE WITHOUT CONTRAST TECHNIQUE: Multidetector CT imaging of the cervical spine was performed without intravenous contrast. Multiplanar CT image reconstructions were also generated. RADIATION DOSE REDUCTION: This exam was performed according to the departmental dose-optimization program which includes automated exposure control, adjustment of the mA and/or kV according to patient size and/or use of iterative reconstruction technique. COMPARISON:  05/26/2017. FINDINGS: Alignment: Normal. Skull base and vertebrae: No acute fracture. Anterior spinal fusion hardware is noted from C5-C7 without evidence of hardware loosening. Soft tissues and spinal canal: No prevertebral  fluid or swelling. No visible canal hematoma. Disc levels: C2-C3: Minimal disc bulge without significant stenosis. Mild facet arthropathy is noted on the left. C3-C4: Minimal disc bulge with uncovertebral osteophyte formation and facet arthropathy bilaterally, greater on the left than on the right. The spinal canal and right neural foramina are within normal limits. There is mild neural foraminal stenosis on the left. C4-C5: Minimal disc bulge with uncovertebral osteophyte formation and facet arthropathy. No significant spinal canal or neural foraminal stenosis. C5-C6: Status post discectomy and fusion. Mild facet arthropathy on the left. No significant stenosis. C6-C7: Status post discectomy and fusion.  No significant stenosis. Upper chest: No acute abnormality. Other: Mild diffuse enlargement of the thyroid gland. Carotid artery calcifications. IMPRESSION: 1. Mild degenerative changes in the cervical spine. 2. Anterior cervical spinal fusion from C5-C7 with no evidence of hardware loosening. Electronically Signed   By: Brett Fairy M.D.   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 No intravenous contrast was administered. COMPARISON:  CT cervical spine Jul 15, 2021. FINDINGS: Alignment: Normal. Vertebrae: C5-C7 ACDF. No specific evidence of acute fracture or discitis/osteomyelitis. No suspicious bone lesions. Cord: Motion limited with normal cord signal. Posterior Fossa, vertebral arteries, paraspinal tissues: Visualized vertebral artery flow voids are maintained. Disc levels: Motion limited evaluation.  Within this limitation: C2-C3: Mild facet arthropathy without significant stenosis. C3-C4: Mild posterior disc osteophyte complex and facet arthropathy. Similar mild left foraminal stenosis. Patent canal. C4-C5: Posterior disc osteophyte complex with mild bilateral facet and uncovertebral hypertrophy. No significant canal or foraminal stenosis. C5-C6: ACDF.  Mild facet arthropathy without significant stenosis. C6-C7: ACDF.  No significant stenosis. C7-T1: No significant disc protrusion, foraminal stenosis, or canal stenosis. IMPRESSION: 1. Motion limited study with overall similar mild multilevel degenerative change, described above. No evidence of impingement. 2. C5-C7 ACDF. Electronically Signed   By: Margaretha Sheffield M.D.   On: 07/16/2021 13:08   DG Hand Complete Left  Result Date: 07/15/2021 CLINICAL DATA:  Sudden onset of hand pain EXAM: LEFT HAND - COMPLETE 3+ VIEW COMPARISON:  None FINDINGS: no fracture or malalignment. Bones appear osteopenic. Joint space narrowing involving the D IP and PIP joints with soft tissue swelling. Mild degenerative changes at the first [facility] joint. Os or old injury at the ulnar styloid. IMPRESSION: 1. No acute osseous abnormality 2. 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   On: 07/15/2021 23:01     Procedures Procedures    Medications Ordered in ED Medications  ketorolac (TORADOL) 30 MG/ML injection 30 mg (30 mg Intravenous Given 07/16/21 1158)  HYDROmorphone (DILAUDID) injection 1 mg (1 mg Intravenous Given 07/16/21 1157)  ondansetron (ZOFRAN) injection 4 mg (4 mg Intravenous Given 07/16/21 1156)  LORazepam (ATIVAN) injection 0.5 mg (0.5 mg Intravenous Given 07/16/21 1204)    ED Course/ Medical Decision Making/ A&P Clinical Course as of 07/16/21 1356  Fri Jul 16, 2021  1353 MRI of the cervical spine appears largely unremarkable.  Patient is substantial improvement of her pain after IV Dilaudid, and had remarkable improvement of her neurological exam on my reassessment, now appears back to baseline with good strength and motor function, and so I  suspect that her initial weakness may in fact have been due to poor effort related to pain.  Again I have a low suspicion for an infection at this point.  She does have a minor increase in white blood cell count which is likely a reactive leukocytosis due to discomfort.  She remains afebrile, normal heart rate here.  I suspect her chest discomfort was likely related to anxiety with a negative troponin level.  Her husband reports that already have a spine surgeon follow-up visit scheduled next week, which I think is appropriate follow-up.  We can offer her a very short course of Dilaudid oral tablets as the tramadol does not appear to be working for her pain at home, although I explained meticulously that she should not be taking both medications at the same time, and to try to reserve the dilaudid for severe pain or bedtime for sleep.  They verbalized understanding [MT]    Clinical Course User Index [MT] Carles Florea, Carola Rhine, MD                           Medical Decision Making Amount and/or Complexity of Data Reviewed Labs: ordered. Radiology: ordered.  Risk Prescription drug management.   This patient presents to the ED with concern for  neck pain, chest pain, left hand pain and weakness. This involves an extensive number of treatment options, and is a complaint that carries with it a high risk of complications and morbidity.  The differential diagnosis includes suspected cervical radiculopathy versus peripheral neuropathy versus other  Chest pain appears very nonspecific, it is not reproducible and she cannot describe it.  I reviewed her EKG which shows normal sinus rhythm no acute ischemic findings.  Her troponin is 4.  My overall suspicion for ACS is quite low.  She has equal breath sounds bilaterally and I doubt this is a pneumothorax. I do suspect there is a very strong anxiety component to her chest pain.  She and her husband are frustrated there are not many additional options to manage her pain, but unfortunately if she has adverse reactions to nearly every single medication, I am not certain what more we can do.  We can try IV Dilaudid here in the ER while we are pending a work-up and an MRI.  Co-morbidities that complicate the patient evaluation: History of cervical anterior fusion surgery raises risk for cervical neuropathy and complications  Additional history obtained from patient's husband at bedside  I reviewed her records from her visit to the ER last night including her CT scan and her labs.  I agree that with normal white blood cell count and inflammatory markers and this reassuring exam, this seems less likely an issue of a septic joint or flexor tenosynovitis.  She would have no risk factors or nidus of infection for either of these.   I ordered and personally interpreted labs.  The pertinent results include: Troponin undetectable, see ED course  I ordered imaging studies including MRI of the cervical spine without contrast to evaluate for nerve root impingement I independently visualized and interpreted imaging which showed stable postsurgical changes, no high-grade cervical canal or nerve root impingement I  agree with the radiologist interpretation  The patient was maintained on a cardiac monitor.  I personally viewed and interpreted the cardiac monitored which showed an underlying rhythm of: Sinus tachycardia initially followed by regular sinus rhythm HR 80's.  I ordered medication including IV Dilaudid for pain  After the interventions noted above, I reevaluated the patient and found that they have: improved  Dispostion:  After consideration of the diagnostic results and the patients response to treatment, I feel that the patent would benefit from outpatient specialist follow-up.         Final Clinical Impression(s) / ED Diagnoses Final diagnoses:  Cervical radiculopathy    Rx / DC Orders ED Discharge Orders          Ordered    HYDROmorphone (DILAUDID) 2 MG tablet  Every 6 hours PRN        07/16/21 1353              Wyvonnia Dusky, MD 07/16/21 1356

## 2021-07-16 NOTE — Discharge Instructions (Addendum)
I prescribed a strong narcotic medicine for pain called Dilaudid.  Please do not take this medication within 6 hours of taking tramadol, as both of these are narcotics and they can over sedate you, leading to difficulty breathing and concern for overdose.  Please follow-up with the spine surgeon at the clinic next week as already scheduled.

## 2021-07-16 NOTE — ED Triage Notes (Addendum)
Patient c/o left shoulder pain that radiates into the left arm to the left hand since last night. Patient was seen in the ED last night for the same. Patient denies any SOB or chest pain.  During triage-HR/135.

## 2021-07-16 NOTE — Discharge Instructions (Addendum)
You were seen in the emergency department today for left arm pain. You work up here has been reassuring. You may have a pinched nerve or radiculopathy causing your pain. Please use warm compresses and massage over the left neck and shoulder to loosen up the muscles. You can take your home Tramadol as needed for pain. Please return for significantly worsening symptoms. Please follow-up with your primary care provider.

## 2021-07-16 NOTE — ED Notes (Signed)
Pt ambulatory to bathroom

## 2021-07-16 NOTE — ED Notes (Signed)
Patient transported to MRI 

## 2021-09-16 ENCOUNTER — Ambulatory Visit: Payer: Medicare Other | Admitting: Physician Assistant

## 2022-01-07 ENCOUNTER — Emergency Department (HOSPITAL_COMMUNITY): Payer: Medicare Other

## 2022-01-07 ENCOUNTER — Emergency Department (HOSPITAL_COMMUNITY)
Admission: EM | Admit: 2022-01-07 | Discharge: 2022-01-07 | Disposition: A | Discharge status: Home / Self Care | Payer: Medicare Other | Attending: Emergency Medicine | Admitting: Emergency Medicine

## 2022-01-07 ENCOUNTER — Encounter (HOSPITAL_COMMUNITY): Payer: Self-pay

## 2022-01-07 DIAGNOSIS — I129 Hypertensive chronic kidney disease with stage 1 through stage 4 chronic kidney disease, or unspecified chronic kidney disease: Secondary | ICD-10-CM | POA: Diagnosis not present

## 2022-01-07 DIAGNOSIS — Z7982 Long term (current) use of aspirin: Secondary | ICD-10-CM | POA: Insufficient documentation

## 2022-01-07 DIAGNOSIS — Z1152 Encounter for screening for COVID-19: Secondary | ICD-10-CM | POA: Diagnosis not present

## 2022-01-07 DIAGNOSIS — R42 Dizziness and giddiness: Secondary | ICD-10-CM | POA: Insufficient documentation

## 2022-01-07 DIAGNOSIS — Z79899 Other long term (current) drug therapy: Secondary | ICD-10-CM | POA: Diagnosis not present

## 2022-01-07 DIAGNOSIS — E1122 Type 2 diabetes mellitus with diabetic chronic kidney disease: Secondary | ICD-10-CM | POA: Diagnosis not present

## 2022-01-07 DIAGNOSIS — Z8673 Personal history of transient ischemic attack (TIA), and cerebral infarction without residual deficits: Secondary | ICD-10-CM | POA: Diagnosis not present

## 2022-01-07 DIAGNOSIS — N189 Chronic kidney disease, unspecified: Secondary | ICD-10-CM | POA: Diagnosis not present

## 2022-01-07 LAB — COMPREHENSIVE METABOLIC PANEL
ALT: 14 U/L (ref 0–44)
AST: 20 U/L (ref 15–41)
Albumin: 4.3 g/dL (ref 3.5–5.0)
Alkaline Phosphatase: 94 U/L (ref 38–126)
Anion gap: 8 (ref 5–15)
BUN: 19 mg/dL (ref 8–23)
CO2: 27 mmol/L (ref 22–32)
Calcium: 9.5 mg/dL (ref 8.9–10.3)
Chloride: 103 mmol/L (ref 98–111)
Creatinine, Ser: 0.67 mg/dL (ref 0.44–1.00)
GFR, Estimated: >60 mL/min (ref >60)
Glucose, Bld: 104 mg/dL — ABNORMAL HIGH (ref 70–99)
Potassium: 4 mmol/L (ref 3.5–5.1)
Sodium: 138 mmol/L (ref 135–145)
Total Bilirubin: 0.5 mg/dL (ref 0.3–1.2)
Total Protein: 7.6 g/dL (ref 6.5–8.1)

## 2022-01-07 LAB — URINALYSIS, ROUTINE W REFLEX MICROSCOPIC
Bilirubin Urine: NEGATIVE
Glucose, UA: NEGATIVE mg/dL
Hgb urine dipstick: NEGATIVE
Ketones, ur: NEGATIVE mg/dL
Leukocytes,Ua: NEGATIVE
Nitrite: NEGATIVE
Protein, ur: NEGATIVE mg/dL
Specific Gravity, Urine: 1.009 (ref 1.005–1.030)
pH: 8 (ref 5.0–8.0)

## 2022-01-07 LAB — RESP PANEL BY RT-PCR (FLU A&B, COVID) ARPGX2
Influenza A by PCR: NEGATIVE
Influenza B by PCR: NEGATIVE
SARS Coronavirus 2 by RT PCR: NEGATIVE

## 2022-01-07 LAB — CBC WITH DIFFERENTIAL/PLATELET
Abs Immature Granulocytes: 0.01 10*3/uL (ref 0.00–0.07)
Basophils Absolute: 0 10*3/uL (ref 0.0–0.1)
Basophils Relative: 1 %
Eosinophils Absolute: 0.1 10*3/uL (ref 0.0–0.5)
Eosinophils Relative: 2 %
HCT: 46 % (ref 36.0–46.0)
Hemoglobin: 14.5 g/dL (ref 12.0–15.0)
Immature Granulocytes: 0 %
Lymphocytes Relative: 14 %
Lymphs Abs: 0.8 10*3/uL (ref 0.7–4.0)
MCH: 27.4 pg (ref 26.0–34.0)
MCHC: 31.5 g/dL (ref 30.0–36.0)
MCV: 87 fL (ref 80.0–100.0)
Monocytes Absolute: 0.5 10*3/uL (ref 0.1–1.0)
Monocytes Relative: 9 %
Neutro Abs: 4 10*3/uL (ref 1.7–7.7)
Neutrophils Relative %: 74 %
Platelets: 252 10*3/uL (ref 150–400)
RBC: 5.29 MIL/uL — ABNORMAL HIGH (ref 3.87–5.11)
RDW: 13.2 % (ref 11.5–15.5)
WBC: 5.5 10*3/uL (ref 4.0–10.5)
nRBC: 0 % (ref 0.0–0.2)

## 2022-01-07 LAB — LACTIC ACID, PLASMA: Lactic Acid, Venous: 0.8 mmol/L (ref 0.5–1.9)

## 2022-01-07 LAB — CBG MONITORING, ED: Glucose-Capillary: 86 mg/dL (ref 70–99)

## 2022-01-07 MED ORDER — MECLIZINE HCL 12.5 MG PO TABS: 12.5000 mg | ORAL_TABLET | Freq: Three times a day (TID) | ORAL | 0 refills | Status: DC | PRN

## 2022-01-07 MED ORDER — LORAZEPAM 0.5 MG PO TABS
0.5000 mg | ORAL_TABLET | Freq: Once | ORAL | Status: AC | PRN
  Administered 2022-01-07: 0.5 mg via ORAL
  Filled 2022-01-07: qty 1

## 2022-01-07 MED ORDER — LACTATED RINGERS IV BOLUS
500.0000 mL | Freq: Once | INTRAVENOUS | Status: AC
  Administered 2022-01-07: 500 mL via INTRAVENOUS

## 2022-01-07 MED ORDER — LORAZEPAM 0.5 MG PO TABS: 0.5000 mg | ORAL_TABLET | Freq: Once | ORAL | Status: DC

## 2022-01-07 MED ORDER — MECLIZINE HCL 25 MG PO TABS
12.5000 mg | ORAL_TABLET | Freq: Once | ORAL | Status: AC
Start: 2022-01-07 — End: 2022-01-07
  Administered 2022-01-07: 12.5 mg via ORAL
  Filled 2022-01-07: qty 1

## 2022-01-07 NOTE — Discharge Instructions (Addendum)
You were seen here today for evaluation of your dizziness and weakness.  Your lab results were unremarkable.  A CT of your head and MRI were normal as well.  This is likely vertigo.  I given you meclizine which is a medication you received while in the ER that seem to help you with your symptoms.  Please take as prescribed.  I would like you to follow-up with your PCP within the next week for reevaluation.  If any concerns, new or worsening symptoms, please return to the nearest emergency department for evaluation.  Contact a health care provider if: Your dizziness does not go away or you have new symptoms. Your dizziness or light-headedness gets worse. You feel nauseous. You have reduced hearing. You have a fever. You have neck pain or a stiff neck. Your dizziness leads to an injury or a fall. Get help right away if: You vomit or have diarrhea and are unable to eat or drink anything. You have problems talking, walking, swallowing, or using your arms, hands, or legs. You feel generally weak. You have any bleeding. You are not thinking clearly or you have trouble forming sentences. It may take a friend or family member to notice this. You have chest pain, abdominal pain, shortness of breath, or sweating. Your vision changes or you develop a severe headache. These symptoms may represent a serious problem that is an emergency. Do not wait to see if the symptoms will go away. Get medical help right away. Call your local emergency services (911 in the [place] Do not drive yourself to the hospital.

## 2022-01-07 NOTE — ED Triage Notes (Signed)
Patient arrived stating around 3am she woke up and has felt dizzy and had a headache.

## 2022-01-07 NOTE — ED Notes (Signed)
Pt was able to ambulate around the hall and back to room. Pt also denies feeling dizziness and weakness.

## 2022-01-07 NOTE — ED Notes (Signed)
Patient to MRI.

## 2022-01-07 NOTE — ED Provider Notes (Signed)
[facility]-EMERGENCY DEPT Provider Note   CSN: [id number] Arrival date & time: 01/07/22  5885     History Chief Complaint  Patient presents with   Dizziness    Stephanie Henson is a 76 y.o. female with history of anxiety, CKD, diabetes, hyperlipidemia, hypertension presents the emergency room and for evaluation of generalized weakness.  Patient reports that she woke up around 0300 to use the bathroom and felt an overall dizziness and generalized weakness feeling.  Patient was unable to stand to go to the restroom.  She reports that she went back to sleep, however woke up and still felt the same dizziness sensation so her husband brought her in.  She denies any chest pain or any shortness of breath.  She reports that she just does not feel good.  Denies any recent fevers or illnesses.  Denies any urinary or abdominal symptoms.  Denies any headache or blurry vision.   Dizziness Associated symptoms: weakness   Associated symptoms: no blood in stool, no chest pain, no headaches, no nausea, no shortness of breath and no vomiting        Home Medications Prior to Admission medications   Medication Sig Start Date End Date Taking? Authorizing Provider  ALPHAGAN P 0.1 % SOLN Place 1 drop into both eyes 3 (three) times daily. 12/24/14   [provider]  amLODipine (NORVASC) 5 MG tablet Take 1 tablet (5 mg total) by mouth daily. 08/19/17   Arby Barrette, MD  aspirin 81 MG tablet Take 81 mg by mouth daily.    [provider]  CAPSAICIN HOT PATCH EX Apply 1 patch topically daily.    [provider]  carboxymethylcellulose (REFRESH PLUS) 0.5 % SOLN 1 drop daily as needed (for dry eye).    [provider]  cetirizine (ZYRTEC) 10 MG tablet Take 10 mg by mouth daily. 02/26/21   [provider]  cholecalciferol (VITAMIN D) 1000 units tablet Take 1,000 Units by mouth daily.    [provider]  HYDROmorphone (DILAUDID) 2 MG tablet  Take 1 tablet (2 mg total) by mouth every 6 (six) hours as needed for up to 10 doses for severe pain. 07/16/21   Terald Sleeper, MD  latanoprost (XALATAN) 0.005 % ophthalmic solution Place 1 drop into both eyes at bedtime.  12/22/12   [provider]  Lifitegrast Benay Spice) 5 % SOLN Place 1 drop into both eyes 2 (two) times daily.    [provider]  LORazepam (ATIVAN) 0.5 MG tablet TAKE 1 TABLET BY MOUTH TWICE A DAY Patient taking differently: Take 0.5 mg by mouth 2 (two) times daily. 07/27/17   Dianne Dun, MD  Omega-3 Fatty Acids (FISH OIL) 1000 MG CAPS Take 2 capsules by mouth daily.     [provider]  omeprazole (PRILOSEC) 20 MG capsule Take 1 capsule (20 mg total) by mouth daily. 05/18/17   Doristine Bosworth, MD  [facility] DELICA LANCETS FINE MISC daily. for testing 07/28/17   [provider]  [facility] VERIO test strip 1 STRIP ONCE A DAY DX E11.9 FINGERSTICK 90 08/01/17   [provider]  rosuvastatin (CRESTOR) 10 MG tablet Take 10 mg by mouth every evening.  02/04/16   [provider]  trolamine salicylate (ASPERCREME) 10 % cream Apply 1 application. topically 2 (two) times daily.    [provider]      Allergies    Amitriptyline, Asa [aspirin], Ciprofloxacin, Codeine, Lyrica [pregabalin], Sudafed [pseudoephedrine hcl], and  Tylenol [acetaminophen]    Review of Systems   Review of Systems  Constitutional:  Negative for chills and fever.  Respiratory:  Negative for shortness of breath.   Cardiovascular:  Negative for chest pain.  Gastrointestinal:  Negative for abdominal pain, blood in stool, constipation, nausea and vomiting.  Genitourinary:  Negative for dysuria and hematuria.  Neurological:  Positive for dizziness and weakness. Negative for headaches.    Physical Exam Updated Vital Signs BP (!) 143/74 (BP Location: Left Arm)   Pulse (!) 112   Temp (!) 97.5 F (36.4 C) (Oral)   Resp 18   Ht 5\' 4"  (1.626 m)   Wt 44 kg    SpO2 100%   BMI 16.65 kg/m  Physical Exam Vitals and nursing note reviewed.  Constitutional:      Comments: Anxious appearing   HENT:     Head: Normocephalic and atraumatic.     Right Ear: Tympanic membrane, ear canal and external ear normal.     Left Ear: Tympanic membrane, ear canal and external ear normal.  Eyes:     Extraocular Movements: Extraocular movements intact.     Pupils: Pupils are equal, round, and reactive to light.     Comments: No nystagmus  Cardiovascular:     Rate and Rhythm: Tachycardia present.     Pulses: Normal pulses.  Pulmonary:     Effort: Pulmonary effort is normal. No respiratory distress.     Breath sounds: Normal breath sounds.  Abdominal:     General: Abdomen is flat. Bowel sounds are normal.     Palpations: Abdomen is soft.     Tenderness: There is no abdominal tenderness. There is no guarding or rebound.  Musculoskeletal:        General: Normal range of motion.     Cervical back: Normal range of motion. No rigidity or tenderness.     Right lower leg: No edema.     Left lower leg: No edema.  Lymphadenopathy:     Cervical: No cervical adenopathy.  Skin:    General: Skin is warm and dry.  Neurological:     General: No focal deficit present.     Mental Status: She is alert and oriented to person, place, and time.     Cranial Nerves: No cranial nerve deficit, dysarthria or facial asymmetry.     Motor: No pronator drift.     Coordination: Finger-Nose-Finger Test normal.     Comments: Patient needing assistance with ambulating to the bathroom. Generalized weakness throughout, no focal weakness. She is alert and oriented x3.      ED Results / Procedures / Treatments   Labs (all labs ordered are listed, but only abnormal results are displayed) Labs Reviewed  URINALYSIS, ROUTINE W REFLEX MICROSCOPIC - Abnormal; Notable for the following components:      Result Value   Color, Urine STRAW (*)    All other components within normal limits  CBC  WITH DIFFERENTIAL/PLATELET - Abnormal; Notable for the following components:   RBC 5.29 (*)    All other components within normal limits  COMPREHENSIVE METABOLIC PANEL - Abnormal; Notable for the following components:   Glucose, Bld 104 (*)    All other components within normal limits  RESP PANEL BY RT-PCR (FLU A&B, COVID) [id number]  URINE CULTURE  LACTIC ACID, PLASMA  CBG MONITORING, ED    EKG EKG Interpretation  Date/Time:  Friday January 07 2022 06:52:42 EST Ventricular Rate:  92 PR Interval:  149 QRS Duration:  87 QT Interval:  360 QTC Calculation: 446 R Axis:   44 Text Interpretation: Sinus rhythm Right atrial enlargement Anterior infarct, old Confirmed by Kristine Royal [id number]) on 01/07/2022 7:13:25 AM  Radiology MR BRAIN WO CONTRAST  Result Date: 01/07/2022 CLINICAL DATA:  Transient ischemic attack (TIA) Syncope/presyncope, cerebrovascular cause suspected EXAM: MRI HEAD WITHOUT CONTRAST TECHNIQUE: Multiplanar, multiecho pulse sequences of the brain and surrounding structures were obtained without intravenous contrast. COMPARISON:  CT head January 07, 2022. FINDINGS: Brain: No acute infarction, hemorrhage, hydrocephalus, extra-axial collection or mass lesion. Vascular: Major arterial flow voids are maintained skull base. Skull and upper cervical spine: Normal marrow signal. Sinuses/Orbits: Left maxillary sinus mucosal thickening. No acute orbital findings. Other: Small bilateral mastoid effusions. IMPRESSION: No evidence of acute intracranial abnormality. Electronically Signed   By: Feliberto Harts M.D.   On: 01/07/2022 11:00   DG Chest 1 View  Result Date: 01/07/2022 CLINICAL DATA:  76 year old female with history of weakness. Dizziness. Headache. EXAM: CHEST  1 VIEW COMPARISON:  Chest x-ray 04/22/2016. FINDINGS: Scattered linear opacities in the lungs bilaterally, most severe in the right mid lung adjacent to the minor fissure, likely to reflect areas of subsegmental  atelectasis and/or scarring. No confluent consolidative airspace disease. No pleural effusions. Skin fold artifact projecting over the left hemithorax incidentally noted. No pneumothorax. No evidence of pulmonary edema. Heart size is normal. Upper mediastinal contours are within normal limits. Orthopedic fixation hardware in the lower cervical spine incidentally noted. IMPRESSION: 1. Scattered areas of subsegmental atelectasis and/or scarring in the lungs bilaterally without definite radiographic evidence of acute cardiopulmonary disease. Electronically Signed   By: Trudie Reed M.D.   On: 01/07/2022 08:51   CT Head Wo Contrast  Result Date: 01/07/2022 CLINICAL DATA:  76 year old female who woke with dizziness and headache. EXAM: CT HEAD WITHOUT CONTRAST TECHNIQUE: Contiguous axial images were obtained from the base of the skull through the vertex without intravenous contrast. RADIATION DOSE REDUCTION: This exam was performed according to the departmental dose-optimization program which includes automated exposure control, adjustment of the mA and/or kV according to patient size and/or use of iterative reconstruction technique. COMPARISON:  Brain MRI 02/26/2015.  Head CT 08/19/2017. FINDINGS: Brain: Cerebral volume remains normal for age. No midline shift, ventriculomegaly, mass effect, evidence of mass lesion, intracranial hemorrhage or evidence of cortically based acute infarction. Gray-white matter differentiation remains within normal limits for age. Vascular: Calcified atherosclerosis at the skull base. No suspicious intracranial vascular hyperdensity. Skull: No acute osseous abnormality identified. Sinuses/Orbits: Left maxillary sinus opacification since 2019. Underlying periosteal thickening there. Other Visualized paranasal sinuses and mastoids are stable and well aerated. Tympanic cavities appear clear. Other: No acute orbit or scalp soft tissue finding. IMPRESSION: 1. Normal for age noncontrast CT  appearance of the brain. 2. Progression of chronic left maxillary sinus disease since 2019. Electronically Signed   By: Odessa Fleming M.D.   On: 01/07/2022 08:42    Procedures Procedures   Medications Ordered in ED Medications  LORazepam (ATIVAN) tablet 0.5 mg (has no administration in time range)  meclizine (ANTIVERT) tablet 12.5 mg (12.5 mg Oral Given 01/07/22 0810)  lactated ringers bolus 500 mL (0 mLs Intravenous Stopped 01/07/22 0844)    ED Course/ Medical Decision Making/ A&P                           Medical Decision Making Amount and/or Complexity of Data Reviewed Labs: ordered. Radiology: ordered.  Risk Prescription drug  management.   76 year old female presents emergency department for evaluation of episode of dizziness.  Differential diagnosis includes is limited to stroke, TIA, anxiety, electrolyte abnormality, vertigo, central vertigo vital signs show normotensive, slightly decreased temperature, slightly tachycardic at 125, otherwise satting well on room air without increased work of breathing.  Physical exam as noted above.  Given patient's symptoms and duration of symptoms, she is out of a stroke window for any acute intervention.  We will proceed with CT without contrast and basic labs.  I independently reviewed and interpreted the patient's labs.  Urinalysis shows straw-colored urine, will culture given the patient's confusion and presentation this morning.  Negative for COVID and flu.  Lactic acid within normal limits.  CMP shows elevated glucose at 104 otherwise no electrolyte abnormality.  No LFT abnormality.  CBC without leukocytosis or anemia.  Normal platelets.  CBC within normal limits.  CT imaging of the head shows  1. Normal for age noncontrast CT appearance of the brain. 2. Progression of chronic left maxillary sinus disease since 2019. .  We will proceed with an MRI.  MRI shows No evidence of acute intracranial abnormality.  On reevaluation, patient is  greatly improved with Ativan and meclizine.  She reports that she feels much better.  She is ambulatory around the room and the emergency department without assistance.  Unsure of what caused the sudden onset of dizziness, likely vertiginous in nature as she did not improve with the Ativan and meclizine.  We will send her home with a meclizine prescription.  I discussed the lab and imaging findings with my attending who agrees that the patient safe for discharge with close PCP follow-up.  I discussed lab and imaging results with patient and family member at bedside.  We discussed the new prescription for meclizine.  We discussed close follow-up with PCP within the next few days.  We discussed return precautions red flag symptoms.  They verbalized understanding and agreed with the plan.  Patient is stable being discharged home in good condition.  I discussed this case with my attending physician who cosigned this note including patient's presenting symptoms, physical exam, and planned diagnostics and interventions. Attending physician stated agreement with plan or made changes to plan which were implemented.   Attending physician assessed patient at bedside.  Final Clinical Impression(s) / ED Diagnoses Final diagnoses:  Vertigo    Rx / DC Orders ED Discharge Orders          Ordered    meclizine (ANTIVERT) 12.5 MG tablet  3 times daily PRN,   Status:  Discontinued        01/07/22 0716              Achille Richansom, Basya Casavant, PA-C 01/09/22 1809    Wynetta FinesMessick, Peter C, MD 01/16/22 [phone]

## 2022-01-09 LAB — URINE CULTURE: Culture: <10000 — AB

## 2022-08-24 ENCOUNTER — Other Ambulatory Visit: Payer: Self-pay | Admitting: Nurse Practitioner

## 2022-08-24 DIAGNOSIS — R221 Localized swelling, mass and lump, neck: Secondary | ICD-10-CM

## 2022-09-16 ENCOUNTER — Other Ambulatory Visit: Payer: Medicare Other

## 2023-04-06 ENCOUNTER — Encounter: Payer: Self-pay | Admitting: Neurology

## 2023-04-06 ENCOUNTER — Ambulatory Visit: Payer: 59 | Admitting: Neurology

## 2023-04-06 VITALS — BP 136/73 | HR 86 | Ht 65.0 in | Wt 105.0 lb

## 2023-04-06 DIAGNOSIS — R519 Headache, unspecified: Secondary | ICD-10-CM

## 2023-04-06 MED ORDER — NURTEC 75 MG PO TBDP: ORAL_TABLET | ORAL | 6 refills | Status: AC

## 2023-04-06 NOTE — Progress Notes (Signed)
Chief Complaint  Patient presents with   New Patient (Initial Visit)    Pt in 14, here with husband Stephanie Henson  Pt is referred for headaches. Pt states she is having headaches every day. Pt reports eye pain and pain in her nose as well.       ASSESSMENT AND PLAN  Stephanie Henson is a 78 y.o. female   Frequent headaches, History of cervical decompression surgery  Her headache has some migraine features, long history of chronic migraine, may have musculoskeletal component, at risk for obstructive sleep apnea, she has small jaw, narrow oropharyngeal space  Normal neurological examination, MRI of the brain and CT head was normal in November 2023, no focal signs to worry about structural abnormality  She does not want to proceed with sleep study,  Will see primary care soon, recommend to ESR C-reactive protein TSH  Nurtec as needed, avoid frequent tramadol use  Return To Clinic as needed  DIAGNOSTIC DATA (LABS, IMAGING, TESTING) - I reviewed patient records, labs, notes, testing and imaging myself where available.   MEDICAL HISTORY:  Stephanie Henson, is a 78 year old female accompanied by her husband seen in request by her primary care from [facility] nurse practitioner Courtney Paris, for evaluation of frequent headache, initial evaluation April 06, 2023  History is obtained from the patient and review of electronic medical records. I personally reviewed pertinent available imaging films in PACS.   PMHx of  HLD HTN GERD CKD Pre-DM Lumbar decompression surgery in 1980s,  Cervical decompression surgery, for severe neck pain Chronic insomnia  She has chronic headache for over 30 years, intermittent, getting worse since 2024, about couple times each week, forehead retro-orbital area pressure headaches, currently treating for sinusitis, which has made her headache worse, with light, noise sensitivity, performed dark quiet room resting, headache can last for few hours, she is  given tramadol 50 mg twice a day for her headache, tried over-the-counter Tylenol ibuprofen with limited help  She denies focal signs, mild blurry vision, MRI of the brain CT head of the brain for evaluation of headache dizziness in November 2023 reviewed, that was normal  She snores some, complains of difficulty staying and falling to sleep, taking melatonin    PHYSICAL EXAM:   Vitals:   04/06/23 1058  BP: 136/73  Pulse: 86  Weight: 105 lb (47.6 kg)  Height: 5\' 5"  (1.651 m)     Body mass index is 17.47 kg/m.  PHYSICAL EXAMNIATION:  Gen: NAD, conversant, well nourised, well groomed                     Cardiovascular: Regular rate rhythm, no peripheral edema, warm, nontender. Eyes: Conjunctivae clear without exudates or hemorrhage Neck: Supple, no carotid bruits. Pulmonary: Clear to auscultation bilaterally   NEUROLOGICAL EXAM:  MENTAL STATUS: Speech/cognition: Awake, alert, oriented to history taking and casual conversation CRANIAL NERVES: CN II: Visual fields are full to confrontation. Pupils are round equal and briskly reactive to light. CN III, IV, VI: extraocular movement are normal. No ptosis. CN V: Facial sensation is intact to light touch CN VII: Face is symmetric with normal eye closure  CN VIII: Hearing is normal to causal conversation. CN IX, X: Phonation is normal. CN XI: Head turning and shoulder shrug are intact CN XII: Narrow oropharyngeal space  MOTOR: There is no pronator drift of out-stretched arms. Muscle bulk and tone are normal. Muscle strength is normal.  REFLEXES: Reflexes are 2+ and symmetric at  the biceps, triceps, knees, and ankles. Plantar responses are flexor.  SENSORY: Intact to light touch, pinprick and vibratory sensation are intact in fingers and toes.  COORDINATION: There is no trunk or limb dysmetria noted.  GAIT/STANCE: Posture is normal. Gait is steady with normal steps, base, arm swing, and turning. Heel and toe walking are  normal. Tandem gait is normal.  Romberg is absent.  REVIEW OF SYSTEMS:  Full 14 system review of systems performed and notable only for as above All other review of systems were negative.   ALLERGIES: Allergies  Allergen Reactions   Amitriptyline Other (See Comments)    Hearing loss   Asa [Aspirin] Nausea Only    Only 325 mg dose    Ciprofloxacin Other (See Comments)    Hearing loss   Codeine Other (See Comments)    Heart flutters   Lyrica [Pregabalin] Other (See Comments)    UNK reaction   Sudafed [Pseudoephedrine Hcl] Palpitations   Tylenol [Acetaminophen] Palpitations    HOME MEDICATIONS: Current Outpatient Medications  Medication Sig Dispense Refill   ALPHAGAN P 0.1 % SOLN Place 1 drop into both eyes 3 (three) times daily.  99   amLODipine (NORVASC) 5 MG tablet Take 1 tablet (5 mg total) by mouth daily. (Patient taking differently: Take 10 mg by mouth daily.) 30 tablet 0   aspirin 81 MG tablet Take 81 mg by mouth daily.     CAPSAICIN HOT PATCH EX Apply 1 patch topically daily.     carboxymethylcellulose (REFRESH PLUS) 0.5 % SOLN 1 drop daily as needed (for dry eye).     cetirizine (ZYRTEC) 10 MG tablet Take 10 mg by mouth daily.     cholecalciferol (VITAMIN D) 1000 units tablet Take 1,000 Units by mouth daily.     esomeprazole (NEXIUM) 40 MG capsule Take 40 mg by mouth daily as needed (acid reflux).     latanoprost (XALATAN) 0.005 % ophthalmic solution Place 1 drop into both eyes at bedtime.      Lifitegrast (XIIDRA) 5 % SOLN Place 1 drop into both eyes 2 (two) times daily.     LORazepam (ATIVAN) 0.5 MG tablet TAKE 1 TABLET BY MOUTH TWICE A DAY (Patient taking differently: Take 0.5 mg by mouth every 8 (eight) hours as needed for anxiety.) 60 tablet 0   Melatonin 5 MG CHEW Chew 5 mg by mouth daily as needed (sleep).     ONETOUCH DELICA LANCETS FINE MISC daily. for testing  3   ONETOUCH VERIO test strip 1 STRIP ONCE A DAY DX E11.9 FINGERSTICK 90  3   rosuvastatin (CRESTOR)  10 MG tablet Take 10 mg by mouth every evening.  1   traMADol (ULTRAM) 50 MG tablet Take 50-100 mg by mouth every 6 (six) hours as needed for moderate pain.     trolamine salicylate (ASPERCREME) 10 % cream Apply 1 application. topically 2 (two) times daily.     fluticasone (FLONASE) 50 MCG/ACT nasal spray Place 2 sprays into both nostrils daily as needed for allergies or rhinitis. (Patient not taking: Reported on 04/06/2023)     No current facility-administered medications for this visit.    PAST MEDICAL HISTORY: Past Medical History:  Diagnosis Date   Anxiety    Cervicalgia    CKD (chronic kidney disease) stage 3, GFR 30-59 ml/min (HCC)    Coronary artery calcification seen on CAT scan 05/18/2016   Myoview 7/18: EF 67, no ischemia; Low Risk    Diabetes mellitus (HCC)  Environmental allergies    FH: colonic polyps    Fibromyalgia    GERD (gastroesophageal reflux disease)    Goiter    Hematuria    Hyperlipidemia    Hypertension    Osteoporosis    Solitary pulmonary nodule     ;RUL - 7mm on CT 04/2014; CT 10/2014 resolution of nodule on RUL however persistant smaller nodules throughouts the lungs   Weight loss     PAST SURGICAL HISTORY: Past Surgical History:  Procedure Laterality Date   BACK SURGERY     NASAL SINUS SURGERY     NECK SURGERY      FAMILY HISTORY: Family History  Problem Relation Age of Onset   Heart disease Father    Heart disease Brother    Lung cancer Sister        smoked    SOCIAL HISTORY: Social History   Socioeconomic History   Marital status: Married    Spouse name: Not on file   Number of children: Not on file   Years of education: Not on file   Highest education level: Not on file  Occupational History   Not on file  Tobacco Use   Smoking status: Never   Smokeless tobacco: Never  Vaping Use   Vaping status: Never Used  Substance and Sexual Activity   Alcohol use: No   Drug use: No   Sexual activity: Not on file  Other Topics  Concern   Not on file  Social History Narrative   Not on file   Social Drivers of Health   Financial Resource Strain: Not on file  Food Insecurity: Not on file  Transportation Needs: Not on file  Physical Activity: Not on file  Stress: Not on file  Social Connections: Not on file  Intimate Partner Violence: Not on file      Levert Feinstein, M.D. Ph.D.  [facility] [address], [address] [place], [place] [address] Ph: [phone] Fax: [phone]  CC:  Vivianne Master, [place] [address] [place],  [place] [address]  Courtney Paris, NP

## 2023-06-08 ENCOUNTER — Other Ambulatory Visit: Payer: Self-pay | Admitting: Physician Assistant

## 2023-06-08 DIAGNOSIS — Z1231 Encounter for screening mammogram for malignant neoplasm of breast: Secondary | ICD-10-CM

## 2023-11-20 NOTE — Progress Notes (Signed)
 History of Present Illness The patient is a 78 year old female who presents for follow-up of sinus issues.  She has been experiencing persistent sinus problems, particularly in her left cheek. She was advised to undergo a procedure under sedation but declined due to concerns about potential memory effects from the anesthesia. She has previously tried antibiotic irrigations, which did not provide significant relief. She is considering resuming these treatments.  Physical Exam   Assessment & Plan 1. Chronic left maxillary sinusitis. Experiencing persistent sinus problems, particularly in the left cheek. A detailed discussion was held regarding the potential benefits and risks of a maxillary punch procedure, which could be performed under local anesthesia. This procedure involves numbing the area under the lip, making a small incision, and using a cannula to flush out the sinus with saline. This procedure is repeatable if symptoms recur. In the interim, antibiotic irrigations will be resumed twice daily using tobramycin.  Results

## 2024-01-26 ENCOUNTER — Emergency Department (HOSPITAL_COMMUNITY)

## 2024-01-26 ENCOUNTER — Other Ambulatory Visit: Payer: Self-pay

## 2024-01-26 ENCOUNTER — Emergency Department (EMERGENCY_DEPARTMENT_HOSPITAL): Admit: 2024-01-26 | Discharge: 2024-01-26 | Disposition: A | Discharge status: Home / Self Care

## 2024-01-26 ENCOUNTER — Emergency Department (HOSPITAL_COMMUNITY): Admission: EM | Admit: 2024-01-26 | Discharge: 2024-01-26 | Disposition: A | Discharge status: Home / Self Care

## 2024-01-26 DIAGNOSIS — R0600 Dyspnea, unspecified: Secondary | ICD-10-CM | POA: Insufficient documentation

## 2024-01-26 DIAGNOSIS — R202 Paresthesia of skin: Secondary | ICD-10-CM | POA: Diagnosis not present

## 2024-01-26 DIAGNOSIS — M79602 Pain in left arm: Secondary | ICD-10-CM | POA: Insufficient documentation

## 2024-01-26 DIAGNOSIS — I10 Essential (primary) hypertension: Secondary | ICD-10-CM | POA: Insufficient documentation

## 2024-01-26 DIAGNOSIS — Z79899 Other long term (current) drug therapy: Secondary | ICD-10-CM | POA: Insufficient documentation

## 2024-01-26 DIAGNOSIS — Z7982 Long term (current) use of aspirin: Secondary | ICD-10-CM | POA: Insufficient documentation

## 2024-01-26 DIAGNOSIS — R059 Cough, unspecified: Secondary | ICD-10-CM | POA: Insufficient documentation

## 2024-01-26 LAB — CBC
HCT: 42 % (ref 36.0–46.0)
Hemoglobin: 13.4 g/dL (ref 12.0–15.0)
MCH: 29.5 pg (ref 26.0–34.0)
MCHC: 31.9 g/dL (ref 30.0–36.0)
MCV: 92.5 fL (ref 80.0–100.0)
Platelets: 249 K/uL (ref 150–400)
RBC: 4.54 MIL/uL (ref 3.87–5.11)
RDW: 14.6 % (ref 11.5–15.5)
WBC: 6.3 K/uL (ref 4.0–10.5)
nRBC: 0 % (ref 0.0–0.2)

## 2024-01-26 LAB — D-DIMER, QUANTITATIVE: D-Dimer, Quant: 0.68 ug{FEU}/mL — ABNORMAL HIGH (ref 0.00–0.50)

## 2024-01-26 LAB — TROPONIN T, HIGH SENSITIVITY
Troponin T High Sensitivity: <15 ng/L (ref 0–19)
Troponin T High Sensitivity: <15 ng/L (ref 0–19)

## 2024-01-26 LAB — BASIC METABOLIC PANEL WITH GFR
Anion gap: 11 (ref 5–15)
BUN: 14 mg/dL (ref 8–23)
CO2: 28 mmol/L (ref 22–32)
Calcium: 9.9 mg/dL (ref 8.9–10.3)
Chloride: 99 mmol/L (ref 98–111)
Creatinine, Ser: 0.85 mg/dL (ref 0.44–1.00)
GFR, Estimated: >60 mL/min (ref >60)
Glucose, Bld: 214 mg/dL — ABNORMAL HIGH (ref 70–99)
Potassium: 3.5 mmol/L (ref 3.5–5.1)
Sodium: 138 mmol/L (ref 135–145)

## 2024-01-26 MED ORDER — MORPHINE SULFATE (PF) 2 MG/ML IV SOLN
2.0000 mg | Freq: Once | INTRAVENOUS | Status: AC
  Administered 2024-01-26: 2 mg via INTRAVENOUS
  Filled 2024-01-26: qty 1

## 2024-01-26 MED ORDER — LACTATED RINGERS IV BOLUS
1000.0000 mL | Freq: Once | INTRAVENOUS | Status: AC
  Administered 2024-01-26: 1000 mL via INTRAVENOUS

## 2024-01-26 MED ORDER — IOHEXOL 350 MG/ML SOLN
75.0000 mL | Freq: Once | INTRAVENOUS | Status: AC | PRN
  Administered 2024-01-26: 75 mL via INTRAVENOUS

## 2024-01-26 MED ORDER — ONDANSETRON HCL 4 MG/2ML IJ SOLN
4.0000 mg | Freq: Once | INTRAMUSCULAR | Status: AC
  Administered 2024-01-26: 4 mg via INTRAVENOUS
  Filled 2024-01-26: qty 2

## 2024-01-26 NOTE — Discharge Instructions (Signed)
 Your workup today was reassuring.  There were no blood clots and your heart tests were unremarkable.  Please continue and finish the antibiotic and call to schedule a follow-up appointment with the pulmonologist at the number provided.  Return to the ER for worsening symptoms.

## 2024-01-26 NOTE — ED Provider Notes (Signed)
 [place] EMERGENCY DEPARTMENT AT [facility] Provider Note   CSN: [id number] Arrival date & time: 01/26/24  9276     Patient presents with: Arm Pain   Stephanie Henson is a 78 y.o. female.   78 year old female with past medical history of hypertension hyperlipidemia presenting to the emergency department today with left arm pain.  The patient states that she woke up with pain and numbness in her left arm.  States that she did sleep on her left arm which is not normal for her.  She has recently been treated for pneumonia.  Is still on antibiotics as an outpatient.  She is denying any shortness of breath but does seem to have some mild conversational dyspnea here.  She does report persistent cough.  Denies a history of DVT or pulmonary embolism, recent surgeries, recent travel.  She denies any headache or weakness in her arm.  States that the pain in her arm is mostly over the medial aspect of her upper arm.  She denies any hemoptysis.   Arm Pain       Prior to Admission medications   Medication Sig Start Date End Date Taking? Authorizing Provider  ALPHAGAN P 0.1 % SOLN Place 1 drop into both eyes 3 (three) times daily. 12/24/14   [provider]  amLODipine  (NORVASC ) 5 MG tablet Take 1 tablet (5 mg total) by mouth daily. Patient taking differently: Take 10 mg by mouth daily. 08/19/17   Armenta Canning, MD  aspirin  81 MG tablet Take 81 mg by mouth daily.    [provider]  CAPSAICIN HOT PATCH EX Apply 1 patch topically daily.    [provider]  carboxymethylcellulose (REFRESH PLUS) 0.5 % SOLN 1 drop daily as needed (for dry eye).    [provider]  cetirizine (ZYRTEC) 10 MG tablet Take 10 mg by mouth daily. 02/26/21   [provider]  cholecalciferol (VITAMIN D) 1000 units tablet Take 1,000 Units by mouth daily.    [provider]  esomeprazole (NEXIUM) 40 MG capsule Take 40 mg by mouth daily as needed (acid reflux).     [provider]  fluticasone  (FLONASE ) 50 MCG/ACT nasal spray Place 2 sprays into both nostrils daily as needed for allergies or rhinitis. Patient not taking: Reported on 04/06/2023    [provider]  latanoprost (XALATAN) 0.005 % ophthalmic solution Place 1 drop into both eyes at bedtime.  12/22/12   [provider]  Lifitegrast CARON) 5 % SOLN Place 1 drop into both eyes 2 (two) times daily.    [provider]  LORazepam  (ATIVAN ) 0.5 MG tablet TAKE 1 TABLET BY MOUTH TWICE A DAY Patient taking differently: Take 0.5 mg by mouth every 8 (eight) hours as needed for anxiety. 07/27/17   Aron, Talia M, MD  Melatonin 5 MG CHEW Chew 5 mg by mouth daily as needed (sleep).    [provider]  [facility] DELICA LANCETS FINE MISC daily. for testing 07/28/17   [provider]  [facility] VERIO test strip 1 STRIP ONCE A DAY DX E11.9 FINGERSTICK 90 08/01/17   [provider]  Rimegepant Sulfate (NURTEC) 75 MG TBDP Take 1 tab at onset of migraine.  May repeat in 2 hrs, if needed.  Max dose: 2 tabs/day. This is a 30 day prescription. 04/06/23   Onita Duos, MD  rosuvastatin (CRESTOR) 10 MG tablet Take 10 mg by mouth every evening. 02/04/16   [provider]  traMADol  (ULTRAM ) 50  MG tablet Take 50-100 mg by mouth every 6 (six) hours as needed for moderate pain.    [provider]  trolamine salicylate (ASPERCREME) 10 % cream Apply 1 application. topically 2 (two) times daily.    [provider]    Allergies: Amitriptyline, Asa [aspirin ], Ciprofloxacin, Codeine, Lyrica  [pregabalin ], Sudafed [pseudoephedrine hcl], and Tylenol  [acetaminophen ]    Review of Systems  Respiratory:  Positive for cough.   Musculoskeletal:  Positive for arthralgias.    Updated Vital Signs BP 138/82 (BP Location: Right Arm)   Pulse 99   Temp 98.2 F (36.8 C) (Oral)   Resp 16   Ht 5' 5 (1.651 m)   Wt 47.6 kg   SpO2 98%   BMI 17.47 kg/m   Physical  Exam Vitals and nursing note reviewed.   No results found for this or any previous visit (from the past 56199 hours). Gen: Anxious appearing with mild conversational dyspnea noted Eyes: PERRL, EOMI HEENT: no oropharyngeal swelling Neck: trachea midline Resp: clear to auscultation bilaterally Card: Tachycardic, no murmurs, rubs, or gallops Abd: nontender, nondistended Extremities: no calf tenderness, the patient is tender over the upper arm with no palpable cords noted and no overlying erythema, the patient has normal range of motion of the shoulder and elbow with no overlying erythema noted Neuro: The patient has equal strength sensation throughout the bilateral upper and lower extremities with no cranial nerve deficits noted Vascular: 2+ radial pulses bilaterally, 2+ DP pulses bilaterally Skin: no rashes Psyc: acting appropriately   (all labs ordered are listed, but only abnormal results are displayed) Labs Reviewed  BASIC METABOLIC PANEL WITH GFR - Abnormal; Notable for the following components:      Result Value   Glucose, Bld 214 (*)    All other components within normal limits  D-DIMER, QUANTITATIVE - Abnormal; Notable for the following components:   D-Dimer, Quant 0.68 (*)    All other components within normal limits  CBC  TROPONIN T, HIGH SENSITIVITY  TROPONIN T, HIGH SENSITIVITY    EKG: EKG Interpretation Date/Time:  Friday January 26 2024 08:45:47 EST Ventricular Rate:  96 PR Interval:  132 QRS Duration:  76 QT Interval:  316 QTC Calculation: 400 R Axis:   -1  Text Interpretation: Sinus rhythm Confirmed by Ula Barter [phone]) on 01/26/2024 8:49:48 AM  Radiology: CT Angio Chest PE W and/or Wo Contrast Result Date: 01/26/2024 CLINICAL DATA:  Pulmonary embolism (PE) suspected, low to intermediate prob, positive D-dimer EXAM: CT ANGIOGRAPHY CHEST WITH CONTRAST TECHNIQUE: Multidetector CT imaging of the chest was performed using the standard protocol during bolus  administration of intravenous contrast. Multiplanar CT image reconstructions and MIPs were obtained to evaluate the vascular anatomy. RADIATION DOSE REDUCTION: This exam was performed according to the departmental dose-optimization program which includes automated exposure control, adjustment of the mA and/or kV according to patient size and/or use of iterative reconstruction technique. CONTRAST:  75mL OMNIPAQUE  IOHEXOL  350 MG/ML SOLN COMPARISON:  January 26, 2024, November 14, 2014 FINDINGS: Pulmonary Embolism: No pulmonary embolism. Cardiovascular: No cardiomegaly or pericardial effusion.No aortic aneurysm. Dense multi-vessel calcified coronary atherosclerosis. Mediastinum/Nodes: No mediastinal mass.No mediastinal, hilar, or axillary lymphadenopathy. Lungs/Pleura: Midline trachea is patent. Fairly diffuse bronchial wall thickening with areas of mucous plugging in both lower lobes. Scattered tree-in-bud nodules in the posterolateral right upper lobe and within the anterior and central right upper lobe measuring up to 4 mm (axial 58). A few scattered tree-in-bud nodules present in the lingula with an area of mucous  plugging and throughout the left lower lobe. Small region of increased consolidation within the posterior right middle lobe, in a region of scarring noted on the prior CT. Patchy ground-glass opacities have developed in the anterior basal right lower lobe. Subpleural reticulation along the posterolateral right lower lobe is slightly more prominent than on the prior study. No pneumothorax or pleural effusion. Musculoskeletal: No acute fracture or destructive bone lesion. Osteopenia. Anterior cervical fusion hardware. Multilevel thoracic osteophytosis. Upper Abdomen: No acute abnormality in the partially visualized upper abdomen. Review of the MIP images confirms the above findings. IMPRESSION: 1. No pulmonary embolism. 2. Fairly diffuse bronchial wall thickening with areas of mucous plugging in both lower  lobes. Scattered tree-in-bud nodularity is also noted throughout the lungs, as described above. These findings are worrisome for an infectious or inflammatory bronchiolitis, which includes atypical etiology such as mycobacterium avium complex, and aspiration. 3. More progressive scarring and collapse in the posterior right middle lobe with areas of patchy ground-glass airspace opacities in the anterior basal right lower lobe. While this could also be infectious in etiology, it is favored to represent adjacent atelectasis and scarring. 4. Dense, multi-vessel calcified coronary artery atherosclerosis. Appropriate risk stratification recommended. Aortic Atherosclerosis (ICD10-I70.0). Electronically Signed   By: Rogelia Myers M.D.   On: 01/26/2024 11:02   DG Humerus Left Result Date: 01/26/2024 EXAM: 1 VIEW(S) XRAY OF THE LEFT HUMERUS 01/26/2024 08:39:00 AM COMPARISON: Left shoulder series 09/27/2016. CLINICAL HISTORY: 78 year old female with shortness of breath, left upper extremity pain . FINDINGS: BONES AND JOINTS: No acute fracture. No malalignment. Unchanged chronic bony deficiency / resorption at the distal left clavicle. SOFT TISSUES: The soft tissues are unremarkable. IMPRESSION: 1. No acute osseous abnormality identified about the left humerus. Electronically signed by: Helayne Hurst MD 01/26/2024 08:54 AM EST RP Workstation: [id number]   DG Chest Port 1 View Result Date: 01/26/2024 EXAM: 1 VIEW(S) XRAY OF THE CHEST 01/26/2024 08:37:00 AM COMPARISON: Portable chest x ray 01/07/2022 and earlier. Prior chest CT 11/14/2014. CLINICAL HISTORY: 78 year old female. SOB. FINDINGS: LUNGS AND PLEURA: Large lung volumes, evidence of chronic hyperinflation on prior chest CT 11/14/2014. Mild chronic curvilinear scarring in the right lower lobe. No focal pulmonary opacity. No pleural effusion. No pneumothorax. HEART AND MEDIASTINUM: No acute abnormality of the cardiac and mediastinal silhouettes. BONES AND SOFT  TISSUES: Lower cervical spinal fixation hardware in place. No acute osseous abnormality. IMPRESSION: 1. No acute cardiopulmonary abnormality. 2. Chronic hyperinflation, mild lung scarring. Electronically signed by: Helayne Hurst MD 01/26/2024 08:52 AM EST RP Workstation: [id number]     Procedures   Medications Ordered in the ED  morphine  (PF) 2 MG/ML injection 2 mg (2 mg Intravenous Given 01/26/24 0822)  ondansetron  (ZOFRAN ) injection 4 mg (4 mg Intravenous Given 01/26/24 0821)  lactated ringers  bolus 1,000 mL (1,000 mLs Intravenous New Bag/Given 01/26/24 0828)  iohexol  (OMNIPAQUE ) 350 MG/ML injection 75 mL (75 mLs Intravenous Contrast Given 01/26/24 1011)                                    Medical Decision Making 78 year old female with past medical history of hypertension and hyperlipidemia presenting to the emergency department today with left upper arm pain.  The patient is somewhat of a difficult historian here.  She does appear to have some mild conversational dyspnea.  I will further evaluate the patient here with basic labs as well as an EKG and troponin  to eval for atypical ACS.  Will obtain chest x-ray to evaluate for persistent/worsening pneumonia, pulmonary edema, or pneumothorax.  Will obtain ultrasound of the left upper extremity to eval for DVT although the patient's exam is relatively reassuring.  She does not have any findings on exam consistent with septic arthritis at this time and does have normal range of motion of the shoulder and elbow.  There are no findings consistent with cellulitis.  I do not see any history of any CHF so we will give the patient IV fluids for the tachycardia.  Will obtain a D-dimer here to evaluate for pulmonary embolism.  The patient is reporting the history of the recently treated pneumonia which could account for some of her respiratory symptoms at this time.  She has strong peripheral pulses so this does not appear to be consistent with acute arterial  occlusion at this time.  The patient's workup here was reassuring.  She had 2 negative troponins.  D-dimer is elevated.  CT angiogram shows no pulmonary embolism but did show some findings concerning for possible pneumonia/aspiration/MAC potentially.  The patient reports that she is feeling better and has been on Levaquin.  States that she does have 1 more day left.  Her heart rate improved with the IV fluids here.  I discussed with the patient that we could admit her for IV antibiotics and further evaluation given these findings as this could represent a treatment failure but she does not want stay in the hospital.  With her symptoms improving this could very well represent persistent imaging findings that have not yet improved since she has started antibiotics since her symptoms are improving clinically.  She is ultimately discharged through shared decision making with pulmonology follow-up and is encouraged to finish her antibiotic course.  She is encouraged to return to the emergency department with worsening symptoms.  Her pulse ox is in the high 90s and she is in no distress on reassessment.  She is stable for discharge.  Amount and/or Complexity of Data Reviewed Labs: ordered. Radiology: ordered.  Risk Prescription drug management.        Final diagnoses:  Left arm pain    ED Discharge Orders     None          Ula Prentice SAUNDERS, MD 01/26/24 1220

## 2024-01-26 NOTE — ED Notes (Signed)
 Gave patient a turkey sandwich and ginger ale. She was still hungry. I gave her an extra meal tray.

## 2024-01-26 NOTE — ED Triage Notes (Signed)
 PT ambulatory to triage with complaints of LEFT arm pain that she noted upon waking up this morning. PT denies chest pain, but endorses mild shortness of breath. Pt noted to be tachycardic in triage.

## 2024-01-26 NOTE — Progress Notes (Signed)
 VASCULAR LAB    Left upper extremity venous duplex has been performed.  See CV proc for preliminary results.  Gave verbal report to Dr. Ula LIS, [facility], RVT 01/26/2024, 11:51 AM

## 2024-03-06 ENCOUNTER — Emergency Department (HOSPITAL_COMMUNITY)
Admission: EM | Admit: 2024-03-06 | Discharge: 2024-03-06 | Disposition: A | Discharge status: Home / Self Care | Attending: Emergency Medicine | Admitting: Emergency Medicine

## 2024-03-06 ENCOUNTER — Encounter (HOSPITAL_COMMUNITY): Payer: Self-pay | Admitting: Pharmacy Technician

## 2024-03-06 DIAGNOSIS — Z7982 Long term (current) use of aspirin: Secondary | ICD-10-CM | POA: Insufficient documentation

## 2024-03-06 DIAGNOSIS — I10 Essential (primary) hypertension: Secondary | ICD-10-CM | POA: Insufficient documentation

## 2024-03-06 DIAGNOSIS — Z79899 Other long term (current) drug therapy: Secondary | ICD-10-CM | POA: Insufficient documentation

## 2024-03-06 DIAGNOSIS — E1165 Type 2 diabetes mellitus with hyperglycemia: Secondary | ICD-10-CM | POA: Diagnosis not present

## 2024-03-06 DIAGNOSIS — R739 Hyperglycemia, unspecified: Secondary | ICD-10-CM

## 2024-03-06 DIAGNOSIS — J0101 Acute recurrent maxillary sinusitis: Secondary | ICD-10-CM

## 2024-03-06 LAB — CBG MONITORING, ED: Glucose-Capillary: 172 mg/dL — ABNORMAL HIGH (ref 70–99)

## 2024-03-06 MED ORDER — DOXYCYCLINE HYCLATE 100 MG PO CAPS: 100.0000 mg | ORAL_CAPSULE | Freq: Two times a day (BID) | ORAL | 0 refills | Status: AC

## 2024-03-06 NOTE — Discharge Instructions (Signed)
 You are seen in the department today for concerns of hypertension and elevated blood sugar levels.  Your blood pressure normalized and I have very little concern regarding this.  Your glucose level was slightly elevated but this is expected given that this was a nonfasting glucose check without severe elevation or with any symptoms.  I have changed her antibiotic for your sinus infection.  Please take this as prescribed.  I would recommend following up with your ENT for further testing.

## 2024-03-06 NOTE — ED Triage Notes (Signed)
 Pt sent here from [facility] for hyperglycemia and hypertension. Endorses headache and sinus issues. Currently on prednisone .

## 2024-03-06 NOTE — ED Provider Notes (Signed)
 " [place] EMERGENCY DEPARTMENT AT [facility] Provider Note   CSN: [id number] Arrival date & time: 03/06/24  1711     Patient presents with: Hypertension and Hyperglycemia   Stephanie Henson is a 79 y.o. female.  Patient presents to the emergency department today with concerns of hypertension and hyperglycemia.  She has a history of hypertension and has been compliant with her medications.  She also has a history of type 2 diabetes and states that she has not been on any medications but is currently taking steroids for a sinus infection.  Reports that she was seen in urgent care and due to concerns for elevated blood pressure and her hyperglycemia, she was advised come in the emergency department for evaluation.  She has no symptoms at this time.   Hypertension  Hyperglycemia Associated symptoms: no fever        Prior to Admission medications  Medication Sig Start Date End Date Taking? Authorizing Provider  ALPHAGAN P 0.1 % SOLN Place 1 drop into both eyes 3 (three) times daily. 12/24/14   [provider]  amLODipine  (NORVASC ) 5 MG tablet Take 1 tablet (5 mg total) by mouth daily. Patient taking differently: Take 10 mg by mouth daily. 08/19/17   Armenta Canning, MD  aspirin  81 MG tablet Take 81 mg by mouth daily.    [provider]  CAPSAICIN HOT PATCH EX Apply 1 patch topically daily.    [provider]  carboxymethylcellulose (REFRESH PLUS) 0.5 % SOLN 1 drop daily as needed (for dry eye).    [provider]  cetirizine (ZYRTEC) 10 MG tablet Take 10 mg by mouth daily. 02/26/21   [provider]  cholecalciferol (VITAMIN D) 1000 units tablet Take 1,000 Units by mouth daily.    [provider]  esomeprazole (NEXIUM) 40 MG capsule Take 40 mg by mouth daily as needed (acid reflux).    [provider]  fluticasone  (FLONASE ) 50 MCG/ACT nasal spray Place 2 sprays into both nostrils daily as needed for allergies or  rhinitis. Patient not taking: Reported on 04/06/2023    [provider]  latanoprost (XALATAN) 0.005 % ophthalmic solution Place 1 drop into both eyes at bedtime.  12/22/12   [provider]  Lifitegrast CARON) 5 % SOLN Place 1 drop into both eyes 2 (two) times daily.    [provider]  LORazepam  (ATIVAN ) 0.5 MG tablet TAKE 1 TABLET BY MOUTH TWICE A DAY Patient taking differently: Take 0.5 mg by mouth every 8 (eight) hours as needed for anxiety. 07/27/17   Aron, Talia M, MD  Melatonin 5 MG CHEW Chew 5 mg by mouth daily as needed (sleep).    [provider]  [facility] DELICA LANCETS FINE MISC daily. for testing 07/28/17   [provider]  [facility] VERIO test strip 1 STRIP ONCE A DAY DX E11.9 FINGERSTICK 90 08/01/17   [provider]  Rimegepant Sulfate (NURTEC) 75 MG TBDP Take 1 tab at onset of migraine.  May repeat in 2 hrs, if needed.  Max dose: 2 tabs/day. This is a 30 day prescription. 04/06/23   Onita Duos, MD  rosuvastatin (CRESTOR) 10 MG tablet Take 10 mg by mouth every evening. 02/04/16   [provider]  traMADol  (ULTRAM ) 50 MG tablet Take 50-100 mg by mouth every 6 (six) hours as needed for moderate pain.    [provider]  trolamine salicylate (ASPERCREME) 10 % cream Apply 1 application. topically 2 (two) times daily.  [provider]    Allergies: Amitriptyline, Asa [aspirin ], Ciprofloxacin, Codeine, Lyrica  [pregabalin ], Sudafed [pseudoephedrine hcl], and Tylenol  [acetaminophen ]    Review of Systems  Constitutional:  Negative for chills and fever.  All other systems reviewed and are negative.   Updated Vital Signs BP (!) 154/75   Pulse 95   Temp 98.6 F (37 C)   Resp 20   SpO2 98%   Physical Exam Vitals and nursing note reviewed.  Constitutional:      General: She is not in acute distress.    Appearance: She is well-developed.  HENT:     Head: Normocephalic and atraumatic.  Eyes:      Conjunctiva/sclera: Conjunctivae normal.  Cardiovascular:     Rate and Rhythm: Normal rate and regular rhythm.     Heart sounds: No murmur heard. Pulmonary:     Effort: Pulmonary effort is normal. No respiratory distress.     Breath sounds: Normal breath sounds.  Abdominal:     Palpations: Abdomen is soft.     Tenderness: There is no abdominal tenderness.  Musculoskeletal:        General: No swelling.     Cervical back: Neck supple.  Skin:    General: Skin is warm and dry.     Capillary Refill: Capillary refill takes less than 2 seconds.  Neurological:     Mental Status: She is alert.  Psychiatric:        Mood and Affect: Mood normal.     (all labs ordered are listed, but only abnormal results are displayed) Labs Reviewed  CBG MONITORING, ED    EKG: None  Radiology: No results found.   Procedures   Medications Ordered in the ED - No data to display                                  Medical Decision Making  This patient presents to the ED for concern of hypertension, hyperglycemia.  Differential diagnosis includes asymptomatic hypertension, hyperglycemia    Additional history obtained:  Additional history obtained from chart review   Lab Tests:  I Ordered, and personally interpreted labs.  The pertinent results include: Point-of-care CBG 172   Problem List / ED Course:  Patient presents to the emergency department today with concerns of hypertension and hyperglycemia.  Was seen urgent care for concerns of sinus infection and was found to have elevated glucose and blood pressure.  Was advised to come the emergency department for evaluation.  She has no symptoms at this time.  She does not take any indications for diabetes per her report.  She states that she is currently on Augmentin for sinus infection and has been on this course of antibiotics for 5 days without any noticeable change or improvement. On exam, there is no wheezing, rales, rhonchi.  She is  otherwise stable with reassuring vitals.  Blood pressure improved to around 135/65.  Glucose checked at 172. Given lack of symptoms and no other acute or focal concerns, she is otherwise stable for outpatient follow-up and discharged home.  Start antibiotics to doxycycline  as she has been on Augmentin for almost entire course without any change in her symptoms.  This is likely a component of chronic sinusitis and I did recommend that she follow-up closely with her ENT for further assessment.   Social Determinants of Health:  None  Final diagnoses:  None    ED Discharge Orders  None          Cecily Legrand DELENA DEVONNA 03/06/24 1901    Laurice Maude BROCKS, MD 03/06/24 [phone]  "

## 2024-03-06 NOTE — ED Notes (Addendum)
CBG 172

## 2024-03-06 NOTE — ED Notes (Signed)
 Patient Alert and oriented to baseline. Stable and ambulatory to baseline. Patient verbalized understanding of the discharge instructions.  Patient belongings were taken by the patient.
